# Patient Record
Sex: Male | Born: 1980 | Race: Black or African American | Hispanic: No | Marital: Single | Smoking: Current some day smoker
Health system: Southern US, Community
[De-identification: ages and names within clinical notes are randomized; demographics above are authoritative.]

## PROBLEM LIST (undated history)

## (undated) DIAGNOSIS — Z720 Tobacco use: Secondary | ICD-10-CM

## (undated) DIAGNOSIS — K219 Gastro-esophageal reflux disease without esophagitis: Secondary | ICD-10-CM

## (undated) DIAGNOSIS — E119 Type 2 diabetes mellitus without complications: Secondary | ICD-10-CM

---

## 2016-03-11 ENCOUNTER — Inpatient Hospital Stay (HOSPITAL_COMMUNITY)
Admission: EM | Admit: 2016-03-11 | Discharge: 2016-03-14 | DRG: 638 | Disposition: A | Discharge status: Home / Self Care | Payer: Self-pay | Attending: Internal Medicine | Admitting: Internal Medicine

## 2016-03-11 ENCOUNTER — Emergency Department (HOSPITAL_COMMUNITY): Payer: Self-pay

## 2016-03-11 ENCOUNTER — Encounter (HOSPITAL_COMMUNITY): Payer: Self-pay | Admitting: Emergency Medicine

## 2016-03-11 DIAGNOSIS — R112 Nausea with vomiting, unspecified: Secondary | ICD-10-CM | POA: Diagnosis present

## 2016-03-11 DIAGNOSIS — E1110 Type 2 diabetes mellitus with ketoacidosis without coma: Principal | ICD-10-CM | POA: Diagnosis present

## 2016-03-11 DIAGNOSIS — E785 Hyperlipidemia, unspecified: Secondary | ICD-10-CM | POA: Diagnosis present

## 2016-03-11 DIAGNOSIS — R109 Unspecified abdominal pain: Secondary | ICD-10-CM | POA: Diagnosis present

## 2016-03-11 DIAGNOSIS — Z6834 Body mass index (BMI) 34.0-34.9, adult: Secondary | ICD-10-CM

## 2016-03-11 DIAGNOSIS — Z9111 Patient's noncompliance with dietary regimen: Secondary | ICD-10-CM

## 2016-03-11 DIAGNOSIS — E860 Dehydration: Secondary | ICD-10-CM | POA: Diagnosis present

## 2016-03-11 DIAGNOSIS — E876 Hypokalemia: Secondary | ICD-10-CM | POA: Diagnosis present

## 2016-03-11 DIAGNOSIS — N179 Acute kidney failure, unspecified: Secondary | ICD-10-CM | POA: Diagnosis present

## 2016-03-11 DIAGNOSIS — Z23 Encounter for immunization: Secondary | ICD-10-CM

## 2016-03-11 DIAGNOSIS — R000 Tachycardia, unspecified: Secondary | ICD-10-CM | POA: Diagnosis present

## 2016-03-11 DIAGNOSIS — Z720 Tobacco use: Secondary | ICD-10-CM | POA: Diagnosis present

## 2016-03-11 DIAGNOSIS — D72829 Elevated white blood cell count, unspecified: Secondary | ICD-10-CM | POA: Diagnosis present

## 2016-03-11 DIAGNOSIS — E669 Obesity, unspecified: Secondary | ICD-10-CM | POA: Diagnosis present

## 2016-03-11 DIAGNOSIS — Z833 Family history of diabetes mellitus: Secondary | ICD-10-CM

## 2016-03-11 DIAGNOSIS — E781 Pure hyperglyceridemia: Secondary | ICD-10-CM | POA: Diagnosis present

## 2016-03-11 DIAGNOSIS — F17200 Nicotine dependence, unspecified, uncomplicated: Secondary | ICD-10-CM | POA: Diagnosis present

## 2016-03-11 DIAGNOSIS — Z79899 Other long term (current) drug therapy: Secondary | ICD-10-CM

## 2016-03-11 DIAGNOSIS — Z91030 Bee allergy status: Secondary | ICD-10-CM

## 2016-03-11 DIAGNOSIS — E1169 Type 2 diabetes mellitus with other specified complication: Secondary | ICD-10-CM | POA: Diagnosis present

## 2016-03-11 DIAGNOSIS — K219 Gastro-esophageal reflux disease without esophagitis: Secondary | ICD-10-CM | POA: Diagnosis present

## 2016-03-11 DIAGNOSIS — E119 Type 2 diabetes mellitus without complications: Secondary | ICD-10-CM

## 2016-03-11 HISTORY — DX: Gastro-esophageal reflux disease without esophagitis: K21.9

## 2016-03-11 HISTORY — DX: Tobacco use: Z72.0

## 2016-03-11 LAB — RAPID URINE DRUG SCREEN, HOSP PERFORMED
AMPHETAMINES: NOT DETECTED
BENZODIAZEPINES: NOT DETECTED
Barbiturates: NOT DETECTED
COCAINE: NOT DETECTED
Opiates: NOT DETECTED
Tetrahydrocannabinol: NOT DETECTED

## 2016-03-11 LAB — COMPREHENSIVE METABOLIC PANEL
ALT: 32 U/L (ref 17–63)
ANION GAP: 27 — AB (ref 5–15)
AST: 27 U/L (ref 15–41)
Albumin: 4.7 g/dL (ref 3.5–5.0)
Alkaline Phosphatase: 82 U/L (ref 38–126)
BUN: 34 mg/dL — ABNORMAL HIGH (ref 6–20)
CALCIUM: 8.8 mg/dL — AB (ref 8.9–10.3)
CHLORIDE: 85 mmol/L — AB (ref 101–111)
CO2: 11 mmol/L — ABNORMAL LOW (ref 22–32)
CREATININE: 1.82 mg/dL — AB (ref 0.61–1.24)
GFR, EST AFRICAN AMERICAN: 54 mL/min — AB (ref >60)
GFR, EST NON AFRICAN AMERICAN: 47 mL/min — AB (ref >60)
Glucose, Bld: 701 mg/dL (ref 65–99)
Potassium: 5 mmol/L (ref 3.5–5.1)
Sodium: 123 mmol/L — ABNORMAL LOW (ref 135–145)
Total Bilirubin: 3.6 mg/dL — ABNORMAL HIGH (ref 0.3–1.2)
Total Protein: 9.3 g/dL — ABNORMAL HIGH (ref 6.5–8.1)

## 2016-03-11 LAB — CBC
HCT: 40 % (ref 39.0–52.0)
Hemoglobin: 14.6 g/dL (ref 13.0–17.0)
MCH: 28.4 pg (ref 26.0–34.0)
MCHC: 36.5 g/dL — ABNORMAL HIGH (ref 30.0–36.0)
MCV: 77.8 fL — AB (ref 78.0–100.0)
PLATELETS: 305 10*3/uL (ref 150–400)
RBC: 5.14 MIL/uL (ref 4.22–5.81)
RDW: 14.4 % (ref 11.5–15.5)
WBC: 12.1 10*3/uL — AB (ref 4.0–10.5)

## 2016-03-11 LAB — BASIC METABOLIC PANEL
Anion gap: 17 — ABNORMAL HIGH (ref 5–15)
BUN: 29 mg/dL — AB (ref 6–20)
CHLORIDE: 97 mmol/L — AB (ref 101–111)
CO2: 16 mmol/L — ABNORMAL LOW (ref 22–32)
CREATININE: 1.6 mg/dL — AB (ref 0.61–1.24)
Calcium: 8 mg/dL — ABNORMAL LOW (ref 8.9–10.3)
GFR calc Af Amer: >60 mL/min (ref >60)
GFR calc non Af Amer: 54 mL/min — ABNORMAL LOW (ref >60)
GLUCOSE: 503 mg/dL — AB (ref 65–99)
POTASSIUM: 5.2 mmol/L — AB (ref 3.5–5.1)
SODIUM: 130 mmol/L — AB (ref 135–145)

## 2016-03-11 LAB — BLOOD GAS, VENOUS
Acid-base deficit: 11.1 mmol/L — ABNORMAL HIGH (ref 0.0–2.0)
BICARBONATE: 13.8 mmol/L — AB (ref 20.0–28.0)
FIO2: 0.21
O2 Saturation: 72.3 %
PH VEN: 7.298 (ref 7.250–7.430)
PO2 VEN: 44.9 mmHg (ref 32.0–45.0)
Patient temperature: 37
pCO2, Ven: 29 mmHg — ABNORMAL LOW (ref 44.0–60.0)

## 2016-03-11 LAB — URINALYSIS, ROUTINE W REFLEX MICROSCOPIC
Bilirubin Urine: NEGATIVE
KETONES UR: 80 mg/dL — AB
LEUKOCYTES UA: NEGATIVE
Nitrite: NEGATIVE
PROTEIN: NEGATIVE mg/dL
Specific Gravity, Urine: 1.02 (ref 1.005–1.030)
Squamous Epithelial / LPF: NONE SEEN
pH: 5 (ref 5.0–8.0)

## 2016-03-11 LAB — GLUCOSE, CAPILLARY
GLUCOSE-CAPILLARY: 537 mg/dL — AB (ref 65–99)
Glucose-Capillary: 470 mg/dL — ABNORMAL HIGH (ref 65–99)

## 2016-03-11 LAB — CBG MONITORING, ED
GLUCOSE-CAPILLARY: 502 mg/dL — AB (ref 65–99)
Glucose-Capillary: >600 mg/dL (ref 65–99)

## 2016-03-11 LAB — BILIRUBIN, DIRECT: Bilirubin, Direct: 0.5 mg/dL (ref 0.1–0.5)

## 2016-03-11 LAB — LIPASE, BLOOD: LIPASE: 37 U/L (ref 11–51)

## 2016-03-11 MED ORDER — ONDANSETRON HCL 4 MG/2ML IJ SOLN
4.0000 mg | Freq: Three times a day (TID) | INTRAMUSCULAR | Status: AC | PRN
  Administered 2016-03-12: 4 mg via INTRAVENOUS
  Filled 2016-03-11: qty 2

## 2016-03-11 MED ORDER — SODIUM CHLORIDE 0.9 % IV BOLUS (SEPSIS)
1000.0000 mL | Freq: Once | INTRAVENOUS | Status: AC
  Administered 2016-03-11: 1000 mL via INTRAVENOUS

## 2016-03-11 MED ORDER — ENOXAPARIN SODIUM 40 MG/0.4ML ~~LOC~~ SOLN
40.0000 mg | Freq: Every day | SUBCUTANEOUS | Status: DC
  Administered 2016-03-11 – 2016-03-13 (×3): 40 mg via SUBCUTANEOUS
  Filled 2016-03-11 (×3): qty 0.4

## 2016-03-11 MED ORDER — ALBUTEROL SULFATE (2.5 MG/3ML) 0.083% IN NEBU: 2.5000 mg | INHALATION_SOLUTION | RESPIRATORY_TRACT | Status: DC | PRN

## 2016-03-11 MED ORDER — MORPHINE SULFATE (PF) 2 MG/ML IV SOLN
2.0000 mg | INTRAVENOUS | Status: DC | PRN
Start: 2016-03-11 — End: 2016-03-12

## 2016-03-11 MED ORDER — SODIUM CHLORIDE 0.9 % IV SOLN
INTRAVENOUS | Status: DC
  Administered 2016-03-11: 4.8 [IU]/h via INTRAVENOUS
  Filled 2016-03-11: qty 2.5

## 2016-03-11 MED ORDER — SODIUM CHLORIDE 0.9 % IV BOLUS (SEPSIS): 1000.0000 mL | Freq: Once | INTRAVENOUS | Status: DC

## 2016-03-11 MED ORDER — DM-GUAIFENESIN ER 30-600 MG PO TB12: 1.0000 | ORAL_TABLET | Freq: Two times a day (BID) | ORAL | Status: DC | PRN

## 2016-03-11 MED ORDER — SODIUM CHLORIDE 0.9 % IV SOLN
INTRAVENOUS | Status: DC
  Administered 2016-03-11: 22:00:00 via INTRAVENOUS

## 2016-03-11 MED ORDER — NICOTINE 21 MG/24HR TD PT24
21.0000 mg | MEDICATED_PATCH | Freq: Every day | TRANSDERMAL | Status: DC
  Filled 2016-03-11: qty 1

## 2016-03-11 MED ORDER — DEXTROSE-NACL 5-0.45 % IV SOLN: INTRAVENOUS | Status: DC

## 2016-03-11 MED ORDER — ACETAMINOPHEN 325 MG PO TABS: 650.0000 mg | ORAL_TABLET | Freq: Four times a day (QID) | ORAL | Status: DC | PRN

## 2016-03-11 MED ORDER — SODIUM CHLORIDE 0.9 % IV SOLN
INTRAVENOUS | Status: DC
  Filled 2016-03-11: qty 2.5

## 2016-03-11 MED ORDER — PNEUMOCOCCAL VAC POLYVALENT 25 MCG/0.5ML IJ INJ
0.5000 mL | INJECTION | INTRAMUSCULAR | Status: AC
  Administered 2016-03-13: 0.5 mL via INTRAMUSCULAR
  Filled 2016-03-11 (×2): qty 0.5

## 2016-03-11 MED ORDER — INFLUENZA VAC SPLIT QUAD 0.5 ML IM SUSY
0.5000 mL | PREFILLED_SYRINGE | INTRAMUSCULAR | Status: AC
  Administered 2016-03-13: 0.5 mL via INTRAMUSCULAR
  Filled 2016-03-11: qty 0.5

## 2016-03-11 MED ORDER — POTASSIUM CHLORIDE CRYS ER 20 MEQ PO TBCR
20.0000 meq | EXTENDED_RELEASE_TABLET | Freq: Once | ORAL | Status: AC
  Administered 2016-03-11: 20 meq via ORAL
  Filled 2016-03-11: qty 1

## 2016-03-11 MED ORDER — POTASSIUM CHLORIDE IN NACL 20-0.9 MEQ/L-% IV SOLN
Freq: Once | INTRAVENOUS | Status: DC
  Filled 2016-03-11: qty 1000

## 2016-03-11 MED ORDER — SODIUM CHLORIDE 0.9 % IV SOLN: INTRAVENOUS | Status: DC

## 2016-03-11 MED ORDER — ORAL CARE MOUTH RINSE
15.0000 mL | Freq: Two times a day (BID) | OROMUCOSAL | Status: DC
  Administered 2016-03-12 – 2016-03-13 (×3): 15 mL via OROMUCOSAL

## 2016-03-11 MED ORDER — ZOLPIDEM TARTRATE 5 MG PO TABS: 5.0000 mg | ORAL_TABLET | Freq: Every evening | ORAL | Status: DC | PRN

## 2016-03-11 MED ORDER — DEXTROSE-NACL 5-0.45 % IV SOLN
INTRAVENOUS | Status: DC
  Administered 2016-03-12: 03:00:00 via INTRAVENOUS

## 2016-03-11 MED ORDER — PANTOPRAZOLE SODIUM 40 MG PO TBEC
40.0000 mg | DELAYED_RELEASE_TABLET | Freq: Every day | ORAL | Status: DC
  Administered 2016-03-12 – 2016-03-14 (×3): 40 mg via ORAL
  Filled 2016-03-11 (×3): qty 1

## 2016-03-11 NOTE — ED Provider Notes (Signed)
[facility]-EMERGENCY DEPT Provider Note   CSN: [id number] Arrival date & time: 03/11/16  1820     History   Chief Complaint Chief Complaint  Patient presents with  . Nausea  . Abdominal Pain    HPI William Martin is a 35 y.o. male.  Patient is a 35 year old male with no prior medical history presenting today for worsening nausea vomiting abdominal pain. Patient states approximately 4 days ago he started to have epigastric pain which is worse with eating. He then developed nausea and now vomits anytime he drinks or eats anything. The pain in his stomach is worse with eating. It is in the epigastric region and is sharp in nature. It does not radiate anywhere and does not feel like his typical reflux. He has complained of polydipsia and polyuria. No diarrhea or fever and last normal bowel movement was yesterday. Denies alcohol, drug or tobacco use within the last month. He takes no prescription medications.   The history is provided by the patient.    History reviewed. No pertinent past medical history.  Patient Active Problem List   Diagnosis Date Noted  . DKA (diabetic ketoacidoses) (HCC) 03/11/2016    History reviewed. No pertinent surgical history.     Home Medications    Prior to Admission medications   Medication Sig Start Date End Date Taking? Authorizing Provider  omeprazole (PRILOSEC OTC) 20 MG tablet Take 20 mg by mouth daily as needed (reflux/heartburn.).   Yes Historical Provider, MD    Family History History reviewed. No pertinent family history.  Social History Social History  Substance Use Topics  . Smoking status: Current Some Day Smoker  . Smokeless tobacco: Never Used  . Alcohol use Yes     Comment: occasional     Allergies   Bee venom   Review of Systems Review of Systems  All other systems reviewed and are negative.    Physical Exam Updated Vital Signs BP (!) 137/106 (BP Location: Left Arm)   Pulse 114   Temp 97.8 F (36.6 C) (Oral)    Resp 18   Ht 5\' 10"  (1.778 m)   Wt 240 lb (108.9 kg)   SpO2 99%   BMI 34.44 kg/m   Physical Exam  Constitutional: He is oriented to person, place, and time. He appears well-developed and well-nourished. No distress.  Smells of ketones  HENT:  Head: Normocephalic and atraumatic.  Mouth/Throat: Oropharynx is clear and moist. Mucous membranes are dry.  Eyes: Conjunctivae and EOM are normal. Pupils are equal, round, and reactive to light.  Neck: Normal range of motion. Neck supple.  Cardiovascular: Regular rhythm and intact distal pulses.  Tachycardia present.   No murmur heard. Pulmonary/Chest: Effort normal and breath sounds normal. No respiratory distress. He has no wheezes. He has no rales.  Abdominal: Soft. He exhibits no distension. There is tenderness. There is no rebound and no guarding.  Mild epigastric tenderness  Musculoskeletal: Normal range of motion. He exhibits no edema or tenderness.  Neurological: He is alert and oriented to person, place, and time.  Skin: Skin is warm and dry. No rash noted. No erythema.  Psychiatric: He has a normal mood and affect. His behavior is normal.  Nursing note and vitals reviewed.    ED Treatments / Results  Labs (all labs ordered are listed, but only abnormal results are displayed) Labs Reviewed  COMPREHENSIVE METABOLIC PANEL - Abnormal; Notable for the following:       Result Value   Sodium 123 (*)  Chloride 85 (*)    CO2 11 (*)    Glucose, Bld 701 (*)    BUN 34 (*)    Creatinine, Ser 1.82 (*)    Calcium 8.8 (*)    Total Protein 9.3 (*)    Total Bilirubin 3.6 (*)    GFR calc non Af Amer 47 (*)    GFR calc Af Amer 54 (*)    Anion gap 27 (*)    All other components within normal limits  CBC - Abnormal; Notable for the following:    WBC 12.1 (*)    MCV 77.8 (*)    MCHC 36.5 (*)    All other components within normal limits  URINALYSIS, ROUTINE W REFLEX MICROSCOPIC - Abnormal; Notable for the following:    Color, Urine  STRAW (*)    Glucose, UA >=500 (*)    Hgb urine dipstick SMALL (*)    Ketones, ur 80 (*)    Bacteria, UA RARE (*)    All other components within normal limits  BLOOD GAS, VENOUS - Abnormal; Notable for the following:    pCO2, Ven 29.0 (*)    Bicarbonate 13.8 (*)    Acid-base deficit 11.1 (*)    All other components within normal limits  CBG MONITORING, ED - Abnormal; Notable for the following:    Glucose-Capillary >600 (*)    All other components within normal limits  LIPASE, BLOOD    EKG ED ECG REPORT   Date: 03/11/2016  Rate: 115  Rhythm: sinus tachycardia  QRS Axis: normal  Intervals: QT prolonged  ST/T Wave abnormalities: normal  Conduction Disutrbances:none  Narrative Interpretation:   Old EKG Reviewed: none available  I have personally reviewed the EKG tracing and agree with the computerized printout as noted.   Radiology Dg Chest 2 View  Result Date: 03/11/2016 CLINICAL DATA:  Shortness of breath for the past 2 days. EXAM: CHEST  2 VIEW COMPARISON:  None. FINDINGS: The heart size and mediastinal contours are within normal limits. Both lungs are clear. The visualized skeletal structures are unremarkable. IMPRESSION: Normal examination. Electronically Signed   By: Beckie SaltsSteven  Reid M.D.   On: 03/11/2016 19:25    Procedures Procedures (including critical care time)  Medications Ordered in ED Medications  sodium chloride 0.9 % bolus 1,000 mL (1,000 mLs Intravenous Not Given 03/11/16 2047)  dextrose 5 %-0.45 % sodium chloride infusion ( Intravenous Hold 03/11/16 2049)  insulin regular (NOVOLIN R,HUMULIN R) 250 Units in sodium chloride 0.9 % 250 mL (1 Units/mL) infusion (not administered)  sodium chloride 0.9 % bolus 1,000 mL (1,000 mLs Intravenous New Bag/Given 03/11/16 2035)    And  sodium chloride 0.9 % bolus 1,000 mL (1,000 mLs Intravenous Not Given 03/11/16 2049)    And  0.9 %  sodium chloride infusion (not administered)  Influenza vac split quadrivalent PF  (FLUARIX) injection 0.5 mL (not administered)  pneumococcal 23 valent vaccine (PNU-IMMUNE) injection 0.5 mL (not administered)  ondansetron (ZOFRAN) injection 4 mg (not administered)  sodium chloride 0.9 % bolus 1,000 mL (1,000 mLs Intravenous New Bag/Given 03/11/16 1954)     Initial Impression / Assessment and Plan / ED Course  I have reviewed the triage vital signs and the nursing notes.  Pertinent labs & imaging results that were available during my care of the patient were reviewed by me and considered in my medical decision making (see chart for details).  Clinical Course     Patient is a 35 year old male with no prior medical  history presenting today with 4 days of worsening epigastric discomfort, nausea and vomiting. Patient denies any alcohol, drug or tobacco use. He takes no medications regularly. Patient smells of ketones on exam and suspicious for DKA. Labs are also consistent with DKA with a bicarbonate of 13 and normal pH and a CO2 of 29. CMP with a blood sugar of 700 and potassium of 5. Evidence of a KI with a creatinine of 1.82 and an anion gap of 27. Mild leukocytosis but no infectious symptoms at this time. Patient received 2 L of fluid and was started on glucose stabilizer with insulin and potassium replacement. Will admit to stepdown.  CRITICAL CARE Performed by: Gwyneth Sprout Total critical care time: 30 minutes Critical care time was exclusive of separately billable procedures and treating other patients. Critical care was necessary to treat or prevent imminent or life-threatening deterioration. Critical care was time spent personally by me on the following activities: development of treatment plan with patient and/or surrogate as well as nursing, discussions with consultants, evaluation of patient's response to treatment, examination of patient, obtaining history from patient or surrogate, ordering and performing treatments and interventions, ordering and review of  laboratory studies, ordering and review of radiographic studies, pulse oximetry and re-evaluation of patient's condition.   Final Clinical Impressions(s) / ED Diagnoses   Final diagnoses:  Diabetic ketoacidosis without coma associated with type 2 diabetes mellitus (HCC)  AKI (acute kidney injury) (HCC)    New Prescriptions New Prescriptions   No medications on file     Gwyneth Sprout, MD 03/11/16 2108

## 2016-03-11 NOTE — ED Triage Notes (Signed)
Pt reports he has had nausea and central abd pain for the past 4 days. No diarrhea or emesis. Has been drinking fluids, but not solid food. Pt also reports feeling of SOB for the past 3 days. No hx of asthma.

## 2016-03-11 NOTE — H&P (Signed)
History and Physical    William Martin ZOX:[id number] DOB: October 22, 1980 DOA: 03/11/2016  Referring MD/NP/PA:   PCP: No primary care provider on file.   Patient coming from:  The patient is coming from home.  At baseline, pt is independent for most of ADL.   Chief Complaint: nausea, vomiting, abdominal pain  HPI: William Martin is a 35 y.o. male with medical history significant of tobacco abuse, GERD, who presents with nausea, vomiting and abdominal pain.  Patient states that he has been having nausea, vomiting and abdominal pain in the past 4 days. His abdominal pain has worsened today. His abdominal pain is located in the upper abdomen, constant, sharp, 7 out of 10 in severity, nonradiating. It is not aggravated or alleviated by any known factors. He vomited once without blood in the vomitus today. He has complained of polydipsia and polyuria. No diarrhea. He has chills, but no fever. He has heartburn and indigestion. He denies chest pain. He has mild dry cough. No symptoms of UTI or unilateral weakness.  ED Course: pt was found to have DAK with AG 27 and CBG 701, bicarbonate 11, lipase is 37, WBC 12.1, negative urinalysis, AKi with creatinine 1.81, blood sugar 701, pseudohyponatremia, negative chest x-ray, temperature normal, tachycardia, oxygen saturation 99% on room air. Patient is admitted to Center as inpatient  Review of Systems:   General: no fevers, has chills, no changes in body weight, has poor appetite, has fatigue HEENT: no blurry vision, hearing changes or sore throat Respiratory: no dyspnea, has coughing, no wheezing CV: no chest pain, no palpitations GI: has nausea, vomiting, abdominal pain, no diarrhea, constipation GU: no dysuria, burning on urination, has increased urinary frequency, no hematuria  Ext: no leg edema Neuro: no unilateral weakness, numbness, or tingling, no vision change or hearing loss Skin: no rash, no skin tear. MSK: No muscle spasm, no deformity, no  limitation of range of movement in spin Heme: No easy bruising.  Travel history: No recent long distant travel.  Allergy:  Allergies  Allergen Reactions  . Bee Venom Swelling    Past Medical History:  Diagnosis Date  . GERD (gastroesophageal reflux disease)   . Tobacco abuse     History reviewed. No pertinent surgical history.  Social History:  reports that he has been smoking.  He has never used smokeless tobacco. He reports that he drinks alcohol. He reports that he does not use drugs.  Family History:  Family History  Problem Relation Age of Onset  . Diabetes type II Mother      Prior to Admission medications   Medication Sig Start Date End Date Taking? Authorizing Provider  omeprazole (PRILOSEC OTC) 20 MG tablet Take 20 mg by mouth daily as needed (reflux/heartburn.).   Yes Historical Provider, MD    Physical Exam: Vitals:   03/11/16 1830 03/11/16 2137  BP: (!) 137/106 141/70  Pulse: 114 99  Resp: 18 18  Temp: 97.8 F (36.6 C)   TempSrc: Oral   SpO2: 99% 100%  Weight: 108.9 kg (240 lb)   Height: 5\' 10"  (1.778 m)    General: Not in acute distress. Dry mucus and membrane HEENT:       Eyes: PERRL, EOMI, no scleral icterus.       ENT: No discharge from the ears and nose, no pharynx injection, no tonsillar enlargement.        Neck: No JVD, no bruit, no mass felt. Heme: No neck lymph node enlargement. Cardiac: S1/S2, RRR, No murmurs,  No gallops or rubs. Respiratory: No rales, wheezing, rhonchi or rubs. GI: Soft, nondistended, mild tenderness in epastric area, no rebound pain, no organomegaly, BS present. GU: No hematuria Ext: No pitting leg edema bilaterally. 2+DP/PT pulse bilaterally. Musculoskeletal: No joint deformities, No joint redness or warmth, no limitation of ROM in spin. Skin: No rashes.  Neuro: Alert, oriented X3, cranial nerves II-XII grossly intact, moves all extremities normally. Psych: Patient is not psychotic, no suicidal or hemocidal  ideation.  Labs on Admission: I have personally reviewed following labs and imaging studies  CBC:  Recent Labs Lab 03/11/16 1917  WBC 12.1*  HGB 14.6  HCT 40.0  MCV 77.8*  PLT 305   Basic Metabolic Panel:  Recent Labs Lab 03/11/16 1917  NA 123*  K 5.0  CL 85*  CO2 11*  GLUCOSE 701*  BUN 34*  CREATININE 1.82*  CALCIUM 8.8*   GFR: Estimated Creatinine Clearance: 70 mL/min (by C-G formula based on SCr of 1.82 mg/dL (H)). Liver Function Tests:  Recent Labs Lab 03/11/16 1917  AST 27  ALT 32  ALKPHOS 82  BILITOT 3.6*  PROT 9.3*  ALBUMIN 4.7    Recent Labs Lab 03/11/16 1917  LIPASE 37   No results for input(s): AMMONIA in the last 168 hours. Coagulation Profile: No results for input(s): INR, PROTIME in the last 168 hours. Cardiac Enzymes: No results for input(s): CKTOTAL, CKMB, CKMBINDEX, TROPONINI in the last 168 hours. BNP (last 3 results) No results for input(s): PROBNP in the last 8760 hours. HbA1C: No results for input(s): HGBA1C in the last 72 hours. CBG:  Recent Labs Lab 03/11/16 1949 03/11/16 2130  GLUCAP >600* 502*   Lipid Profile: No results for input(s): CHOL, HDL, LDLCALC, TRIG, CHOLHDL, LDLDIRECT in the last 72 hours. Thyroid Function Tests: No results for input(s): TSH, T4TOTAL, FREET4, T3FREE, THYROIDAB in the last 72 hours. Anemia Panel: No results for input(s): VITAMINB12, FOLATE, FERRITIN, TIBC, IRON, RETICCTPCT in the last 72 hours. Urine analysis:    Component Value Date/Time   COLORURINE STRAW (A) 03/11/2016 1920   APPEARANCEUR CLEAR 03/11/2016 1920   LABSPEC 1.020 03/11/2016 1920   PHURINE 5.0 03/11/2016 1920   GLUCOSEU >=500 (A) 03/11/2016 1920   HGBUR SMALL (A) 03/11/2016 1920   BILIRUBINUR NEGATIVE 03/11/2016 1920   KETONESUR 80 (A) 03/11/2016 1920   PROTEINUR NEGATIVE 03/11/2016 1920   NITRITE NEGATIVE 03/11/2016 1920   LEUKOCYTESUR NEGATIVE 03/11/2016 1920   Sepsis  Labs: @LABRCNTIP (procalcitonin:4,lacticidven:4) )No results found for this or any previous visit (from the past 240 hour(s)).   Radiological Exams on Admission: Dg Chest 2 View  Result Date: 03/11/2016 CLINICAL DATA:  Shortness of breath for the past 2 days. EXAM: CHEST  2 VIEW COMPARISON:  None. FINDINGS: The heart size and mediastinal contours are within normal limits. Both lungs are clear. The visualized skeletal structures are unremarkable. IMPRESSION: Normal examination. Electronically Signed   By: Beckie Salts M.D.   On: 03/11/2016 19:25     EKG: Independently reviewed.  Sinus rhythm, QTC 472, tachycardia, nonspecific T-wave change   Assessment/Plan Principal Problem:   DKA (diabetic ketoacidoses) (HCC) Active Problems:   New onset type 2 diabetes mellitus (HCC)   Tobacco abuse   GERD (gastroesophageal reflux disease)   AKI (acute kidney injury) (HCC)   New onset type 2 diabetes mellitus and DKA (diabetic ketoacidoses): Patient has DKA with AG of 27 and bicarbonate of 11, ketone positivity in urine. Mental status is normal.   - Admit to stepdown  as inpt - Received 4L of NS bolus in ED - start DKA protocol with BMP q4h - IVF: NS 125 cc/h; will switch to D5-1/2NS when CBG<250 - replete K as needed - Zofran prn nausea  - NPO  - consult to diabetic educator and case manager  Tobacco abuse: -Did counseling about importance of quitting smoking -Nicotine patch  GERD: -Protonix  AKI: Likely due to prerenal secondary to dehydration. Urine protein negative - IVF as above - Check FeNa - Follow up renal function by BMP  Leukocytosis: no signs of infection. Chest x-ray negative. Urinalysis negative. Likely due to DKA. -follow up by CBC  Abdominal pain, nausea vomiting: Lipase normal. Most likely due to DKA. Currently his abdominal pain is mild. His total bilirubin is 3.6 with normal transaminase, will need to r/o biliary system problem -US-abdomen -direct BR -When  necessary Zofran for nausea and morphine for pain   DVT ppx: SQ Lovenox Code Status: Full code Family Communication: Yes, patient's nephew at bed side Disposition Plan:  Anticipate discharge back to previous home environment Consults called:  none Admission status: Inpatient/tele   Date of Service 03/11/2016    Lorretta HarpIU, Hermena Swint [facility] Pager [phone]  If 7PM-7AM, please contact night-coverage [email or web address] Password [email or web address] 03/11/2016, 9:52 PM

## 2016-03-12 ENCOUNTER — Inpatient Hospital Stay (HOSPITAL_COMMUNITY): Payer: Self-pay

## 2016-03-12 DIAGNOSIS — E119 Type 2 diabetes mellitus without complications: Secondary | ICD-10-CM

## 2016-03-12 DIAGNOSIS — E1310 Other specified diabetes mellitus with ketoacidosis without coma: Secondary | ICD-10-CM

## 2016-03-12 DIAGNOSIS — K219 Gastro-esophageal reflux disease without esophagitis: Secondary | ICD-10-CM

## 2016-03-12 DIAGNOSIS — E876 Hypokalemia: Secondary | ICD-10-CM

## 2016-03-12 LAB — BASIC METABOLIC PANEL
ANION GAP: 19 — AB (ref 5–15)
ANION GAP: 15 (ref 5–15)
ANION GAP: 12 (ref 5–15)
BUN: 24 mg/dL — ABNORMAL HIGH (ref 6–20)
BUN: 21 mg/dL — ABNORMAL HIGH (ref 6–20)
BUN: 17 mg/dL (ref 6–20)
CO2: 16 mmol/L — ABNORMAL LOW (ref 22–32)
CO2: 20 mmol/L — ABNORMAL LOW (ref 22–32)
CO2: 20 mmol/L — ABNORMAL LOW (ref 22–32)
Calcium: 8.2 mg/dL — ABNORMAL LOW (ref 8.9–10.3)
Calcium: 8.4 mg/dL — ABNORMAL LOW (ref 8.9–10.3)
Calcium: 7.8 mg/dL — ABNORMAL LOW (ref 8.9–10.3)
Chloride: 101 mmol/L (ref 101–111)
Chloride: 103 mmol/L (ref 101–111)
Chloride: 101 mmol/L (ref 101–111)
Creatinine, Ser: 1.39 mg/dL — ABNORMAL HIGH (ref 0.61–1.24)
Creatinine, Ser: 1.16 mg/dL (ref 0.61–1.24)
Creatinine, Ser: 1.06 mg/dL (ref 0.61–1.24)
GFR calc Af Amer: >60 mL/min (ref >60)
GFR calc Af Amer: >60 mL/min (ref >60)
Glucose, Bld: 268 mg/dL — ABNORMAL HIGH (ref 65–99)
Glucose, Bld: 138 mg/dL — ABNORMAL HIGH (ref 65–99)
Glucose, Bld: 268 mg/dL — ABNORMAL HIGH (ref 65–99)
POTASSIUM: 4.2 mmol/L (ref 3.5–5.1)
POTASSIUM: 3.8 mmol/L (ref 3.5–5.1)
POTASSIUM: 3.4 mmol/L — AB (ref 3.5–5.1)
SODIUM: 136 mmol/L (ref 135–145)
SODIUM: 138 mmol/L (ref 135–145)
SODIUM: 133 mmol/L — AB (ref 135–145)

## 2016-03-12 LAB — LIPID PANEL
Cholesterol: 311 mg/dL — ABNORMAL HIGH (ref 0–200)
HDL: 32 mg/dL — ABNORMAL LOW (ref >40)
LDL CALC: UNDETERMINED mg/dL (ref 0–99)
TRIGLYCERIDES: 497 mg/dL — AB (ref <150)
Total CHOL/HDL Ratio: 9.7 RATIO
VLDL: UNDETERMINED mg/dL (ref 0–40)

## 2016-03-12 LAB — GLUCOSE, CAPILLARY
GLUCOSE-CAPILLARY: 151 mg/dL — AB (ref 65–99)
GLUCOSE-CAPILLARY: 218 mg/dL — AB (ref 65–99)
GLUCOSE-CAPILLARY: 243 mg/dL — AB (ref 65–99)
GLUCOSE-CAPILLARY: 263 mg/dL — AB (ref 65–99)
GLUCOSE-CAPILLARY: 266 mg/dL — AB (ref 65–99)
GLUCOSE-CAPILLARY: 302 mg/dL — AB (ref 65–99)
GLUCOSE-CAPILLARY: 315 mg/dL — AB (ref 65–99)
GLUCOSE-CAPILLARY: 374 mg/dL — AB (ref 65–99)
Glucose-Capillary: 203 mg/dL — ABNORMAL HIGH (ref 65–99)
Glucose-Capillary: 159 mg/dL — ABNORMAL HIGH (ref 65–99)
Glucose-Capillary: 142 mg/dL — ABNORMAL HIGH (ref 65–99)
Glucose-Capillary: 159 mg/dL — ABNORMAL HIGH (ref 65–99)
Glucose-Capillary: 218 mg/dL — ABNORMAL HIGH (ref 65–99)
Glucose-Capillary: 138 mg/dL — ABNORMAL HIGH (ref 65–99)
Glucose-Capillary: 331 mg/dL — ABNORMAL HIGH (ref 65–99)

## 2016-03-12 LAB — MRSA PCR SCREENING: MRSA by PCR: NEGATIVE

## 2016-03-12 LAB — CREATININE, URINE, RANDOM: CREATININE, URINE: 21.75 mg/dL

## 2016-03-12 LAB — SODIUM, URINE, RANDOM: Sodium, Ur: 42 mmol/L

## 2016-03-12 MED ORDER — INSULIN ASPART 100 UNIT/ML ~~LOC~~ SOLN
4.0000 [IU] | Freq: Three times a day (TID) | SUBCUTANEOUS | Status: DC
  Administered 2016-03-12: 4 [IU] via SUBCUTANEOUS

## 2016-03-12 MED ORDER — ALUM & MAG HYDROXIDE-SIMETH 200-200-20 MG/5ML PO SUSP
30.0000 mL | Freq: Four times a day (QID) | ORAL | Status: DC | PRN
  Administered 2016-03-12: 30 mL via ORAL
  Filled 2016-03-12: qty 30

## 2016-03-12 MED ORDER — INSULIN DETEMIR 100 UNIT/ML ~~LOC~~ SOLN: 40.0000 [IU] | Freq: Every day | SUBCUTANEOUS | Status: DC

## 2016-03-12 MED ORDER — INSULIN ASPART 100 UNIT/ML ~~LOC~~ SOLN
0.0000 [IU] | Freq: Three times a day (TID) | SUBCUTANEOUS | Status: DC
  Administered 2016-03-12: 15 [IU] via SUBCUTANEOUS
  Administered 2016-03-13: 20 [IU] via SUBCUTANEOUS
  Administered 2016-03-13: 11 [IU] via SUBCUTANEOUS
  Administered 2016-03-14: 20 [IU] via SUBCUTANEOUS
  Administered 2016-03-14: 11 [IU] via SUBCUTANEOUS

## 2016-03-12 MED ORDER — INSULIN DETEMIR 100 UNIT/ML ~~LOC~~ SOLN
35.0000 [IU] | Freq: Every day | SUBCUTANEOUS | Status: DC
Start: 2016-03-12 — End: 2016-03-13
  Administered 2016-03-12: 35 [IU] via SUBCUTANEOUS
  Filled 2016-03-12 (×2): qty 0.35

## 2016-03-12 MED ORDER — INSULIN ASPART 100 UNIT/ML ~~LOC~~ SOLN
0.0000 [IU] | Freq: Every day | SUBCUTANEOUS | Status: DC
  Administered 2016-03-12 – 2016-03-13 (×2): 4 [IU] via SUBCUTANEOUS

## 2016-03-12 NOTE — Progress Notes (Signed)
In room to titrate insulin drip - CBG noted to be 266. Family in room with patient. I asked patient what he had eaten, he states "nothing Im trying to figure out why it went up." Asked patient what he had to drink as a large [employer] cup was sitting on the bedside table. He states "Sprite." Sister states she bought the Sprite to the patient because she was not aware that he could not have it. Patient states "I knew I wasn't supposed to drink it but I wanted it." Family informed that they are not allowed to bring food or drink from outside sources. Patient educated that he can only drink water or diet sodas, and reminded that he is already in DKA and if he continues with poor habits, his outcome will not improve. Patient nodded his head to agree. Sprite poured out into sink by nephew. Will continue to educate patient.

## 2016-03-12 NOTE — Progress Notes (Signed)
Pt. Arrived to floor from ICU. Alert and oriented x 4. No respiratory distress noted. Family at bedside.

## 2016-03-12 NOTE — Progress Notes (Signed)
[facility] transfer to [facility], as a med surg transfer. Pt in stable condition, with no acute distress, VSS, alert, oriented x4, verbal, appropriate. All belongings sent with patient. Patient taken via wheelchair to 1501.

## 2016-03-12 NOTE — Progress Notes (Signed)
PROGRESS NOTE                                                                                                                                                                                                             Patient Demographics:    William Martin, is a 35 y.o. male, DOB - February 06, 1981, NWG:[id number]  Admit date - 03/11/2016   Admitting Physician Lorretta Harp, MD  Outpatient Primary MD for the patient is No primary care provider on file.  LOS - 1  Outpatient Specialists:None  Chief Complaint  Patient presents with  . Nausea  . Abdominal Pain       Brief Narrative   35 year old obese male with history of GERD presented with 3-4 days of dominant discomfort with nausea and vomiting, polyuria and polydipsia. Patient found to be in DKA with CBG of 701, anion gap of 27 and bicarbonate of 11. He also had acute kidney injury. Admitted to stepdown on insulin drip and IV hydration.   Subjective:   No further nausea or vomiting. Denies abdominal pain. Reports being very hungry.   Assessment  & Plan :    Principal Problem:   DKA (diabetic ketoacidoses) (HCC) New onset . Reports strong family history (both mom and grandmother had uncontrolled diabetes). Hydrated aggressively and placed on insulin drip. Anion gap now closed and CABG stable. Transition to Levemir 35 units daily and pre-meal aspart 4 units 3 times a day with meals. Suspect type 2 diabetes. Will need long-acting insulin and oral hypoglycemic (preferably metformin) upon discharge. -Discussed at length with patient and his sister at bedside on last modifications, diet adherence, blood glucose monitoring, exercise to lose weight and close outpatient follow-up. Patient does not have a PCP and will provide list of PCPs in the community to establish care.  Active Problems:    GERD (gastroesophageal reflux disease) Continue PPI    AKI (acute kidney injury)  (HCC) Prerenal secondary to dehydration. Resolved with IV fluids. Next on son hypokalemia Replenished  Obesity Counseled on diet and exercise   Code Status : Full code  Family Communication  : Sister at bedside  Disposition Plan  : Transfer to [facility]. Home tomorrow if CBG stable  Barriers For Discharge : Improving symptoms  Consults  :   Diabetic coordinator  Procedures  : None DVT Prophylaxis  :  Lovenox -   Lab Results  Component Value Date   PLT 305 03/11/2016    Antibiotics  :    Anti-infectives    None        Objective:   Vitals:   03/12/16 0800 03/12/16 0900 03/12/16 1000 03/12/16 1100  BP: 126/83     Pulse: 97 90 93 96  Resp: 13 10 12 19   Temp: 98.1 F (36.7 C)     TempSrc: Oral     SpO2: 100% 95% 98% 95%  Weight:      Height:        Wt Readings from Last 3 Encounters:  03/11/16 108.9 kg (240 lb)     Intake/Output Summary (Last 24 hours) at 03/12/16 1204 Last data filed at 03/12/16 1100  Gross per 24 hour  Intake           1798.8 ml  Output             2350 ml  Net           -551.2 ml     Physical Exam  Gen: not in distress HEENT:  moist mucosa, supple neck Chest: clear b/l, no added sounds CVS: N S1&S2, no murmurs, GI: soft, NT, ND, BS+ Musculoskeletal: warm, no edema     Data Review:    CBC  Recent Labs Lab 03/11/16 1917  WBC 12.1*  HGB 14.6  HCT 40.0  PLT 305  MCV 77.8*  MCH 28.4  MCHC 36.5*  RDW 14.4    Chemistries   Recent Labs Lab 03/11/16 1917 03/11/16 2227 03/12/16 0142 03/12/16 0557 03/12/16 0955  NA 123* 130* 136 138 133*  K 5.0 5.2* 4.2 3.8 3.4*  CL 85* 97* 101 103 101  CO2 11* 16* 16* 20* 20*  GLUCOSE 701* 503* 268* 138* 268*  BUN 34* 29* 24* 21* 17  CREATININE 1.82* 1.60* 1.39* 1.16 1.06  CALCIUM 8.8* 8.0* 8.2* 8.4* 7.8*  AST 27  --   --   --   --   ALT 32  --   --   --   --   ALKPHOS 82  --   --   --   --   BILITOT 3.6*  --   --   --   --     ------------------------------------------------------------------------------------------------------------------  Recent Labs  03/12/16 0557  CHOL 311*  HDL 32*  LDLCALC UNABLE TO CALCULATE IF TRIGLYCERIDE OVER 400 mg/dL  TRIG [id number]*  CHOLHDL 9.7    No results found for: HGBA1C ------------------------------------------------------------------------------------------------------------------ No results for input(s): TSH, T4TOTAL, T3FREE, THYROIDAB in the last 72 hours.  Invalid input(s): FREET3 ------------------------------------------------------------------------------------------------------------------ No results for input(s): VITAMINB12, FOLATE, FERRITIN, TIBC, IRON, RETICCTPCT in the last 72 hours.  Coagulation profile No results for input(s): INR, PROTIME in the last 168 hours.  No results for input(s): DDIMER in the last 72 hours.  Cardiac Enzymes No results for input(s): CKMB, TROPONINI, MYOGLOBIN in the last 168 hours.  Invalid input(s): CK ------------------------------------------------------------------------------------------------------------------ No results found for: BNP  Inpatient Medications  Scheduled Meds: . enoxaparin (LOVENOX) injection  40 mg Subcutaneous QHS  . Influenza vac split quadrivalent PF  0.5 mL Intramuscular Tomorrow-1000  . insulin aspart  0-20 Units Subcutaneous TID WC  . insulin aspart  0-5 Units Subcutaneous QHS  . insulin aspart  4 Units Subcutaneous TID WC  . insulin detemir  35 Units Subcutaneous Daily  . mouth rinse  15 mL Mouth Rinse BID  . pantoprazole  40 mg  Oral Q1200  . pneumococcal 23 valent vaccine  0.5 mL Intramuscular Tomorrow-1000  . sodium chloride  1,000 mL Intravenous Once  . sodium chloride  1,000 mL Intravenous Once   Continuous Infusions: . dextrose 5 % and 0.45% NaCl 100 mL/hr at 03/12/16 0600  . insulin (NOVOLIN-R) infusion 14.2 Units/hr (03/12/16 1130)   PRN Meds:.acetaminophen, albuterol,  dextromethorphan-guaiFENesin, morphine injection, zolpidem  Micro Results Recent Results (from the past 240 hour(s))  MRSA PCR Screening     Status: None   Collection Time: 03/12/16  2:08 AM  Result Value Ref Range Status   MRSA by PCR NEGATIVE NEGATIVE Final    Comment:        The GeneXpert MRSA Assay (FDA approved for NASAL specimens only), is one component of a comprehensive MRSA colonization surveillance program. It is not intended to diagnose MRSA infection nor to guide or monitor treatment for MRSA infections.     Radiology Reports Dg Chest 2 View  Result Date: 03/11/2016 CLINICAL DATA:  Shortness of breath for the past 2 days. EXAM: CHEST  2 VIEW COMPARISON:  None. FINDINGS: The heart size and mediastinal contours are within normal limits. Both lungs are clear. The visualized skeletal structures are unremarkable. IMPRESSION: Normal examination. Electronically Signed   By: Beckie SaltsSteven  Reid M.D.   On: 03/11/2016 19:25   [place] Abdomen Limited Ruq  Result Date: 03/12/2016 CLINICAL DATA:  Abdominal pain, elevated bilirubin EXAM: US ABDOMEN LIMITED - RIGHT UPPER QUADRANT COMPARISON:  None. FINDINGS: Gallbladder: Small amount of sludge within the gallbladder. No visible stones, wall thickening or sonographic Murphy sign. Common bile duct: Diameter: Normal caliber, 5 mm Liver: Increased echotexture throughout the liver compatible with fatty infiltration. Area of focal fatty sparing near the gallbladder fossa. IMPRESSION: Fatty infiltration of the liver. Small amount of gallbladder sludge without cholelithiasis or changes of acute cholecystitis. Electronically Signed   By: Charlett NoseKevin  Dover M.D.   On: 03/12/2016 10:35    Time Spent in minutes  35   Eddie NorthHUNGEL, Blanchard Willhite M.D on 03/12/2016 at 12:04 PM  Between 7am to 7pm - Pager - [phone]  After 7pm go to [email or web address] - password [email or web address]  [facility] -  Office  [phone]

## 2016-03-13 DIAGNOSIS — E0865 Diabetes mellitus due to underlying condition with hyperglycemia: Secondary | ICD-10-CM

## 2016-03-13 DIAGNOSIS — E782 Mixed hyperlipidemia: Secondary | ICD-10-CM

## 2016-03-13 LAB — GLUCOSE, CAPILLARY
GLUCOSE-CAPILLARY: 412 mg/dL — AB (ref 65–99)
GLUCOSE-CAPILLARY: 317 mg/dL — AB (ref 65–99)
Glucose-Capillary: 297 mg/dL — ABNORMAL HIGH (ref 65–99)
Glucose-Capillary: 463 mg/dL — ABNORMAL HIGH (ref 65–99)

## 2016-03-13 LAB — HIV-1 RNA QUANT-NO REFLEX-BLD: LOG10 HIV-1 RNA: UNDETERMINED log10copy/mL

## 2016-03-13 LAB — BASIC METABOLIC PANEL
Anion gap: 10 (ref 5–15)
BUN: 14 mg/dL (ref 6–20)
CO2: 24 mmol/L (ref 22–32)
Calcium: 8.3 mg/dL — ABNORMAL LOW (ref 8.9–10.3)
Chloride: 98 mmol/L — ABNORMAL LOW (ref 101–111)
Creatinine, Ser: 0.98 mg/dL (ref 0.61–1.24)
GFR calc Af Amer: >60 mL/min (ref >60)
GLUCOSE: 312 mg/dL — AB (ref 65–99)
POTASSIUM: 3.6 mmol/L (ref 3.5–5.1)
Sodium: 132 mmol/L — ABNORMAL LOW (ref 135–145)

## 2016-03-13 LAB — HEMOGLOBIN A1C
HEMOGLOBIN A1C: 12.1 % — AB (ref 4.8–5.6)
MEAN PLASMA GLUCOSE: 301 mg/dL

## 2016-03-13 MED ORDER — INSULIN STARTER KIT- SYRINGES (ENGLISH)
1.0000 | Freq: Once | Status: AC
  Administered 2016-03-13: 1
  Filled 2016-03-13: qty 1

## 2016-03-13 MED ORDER — INSULIN DETEMIR 100 UNIT/ML ~~LOC~~ SOLN
45.0000 [IU] | Freq: Every day | SUBCUTANEOUS | Status: DC
  Administered 2016-03-13: 45 [IU] via SUBCUTANEOUS
  Filled 2016-03-13: qty 0.45

## 2016-03-13 MED ORDER — PRAVASTATIN SODIUM 20 MG PO TABS
20.0000 mg | ORAL_TABLET | Freq: Every day | ORAL | Status: DC
  Administered 2016-03-13: 20 mg via ORAL
  Filled 2016-03-13: qty 1

## 2016-03-13 MED ORDER — POTASSIUM CHLORIDE CRYS ER 20 MEQ PO TBCR
40.0000 meq | EXTENDED_RELEASE_TABLET | Freq: Once | ORAL | Status: AC
  Administered 2016-03-13: 40 meq via ORAL
  Filled 2016-03-13: qty 2

## 2016-03-13 MED ORDER — BLOOD GLUCOSE MONITOR KIT: PACK | 0 refills | Status: DC

## 2016-03-13 MED ORDER — INSULIN DETEMIR 100 UNIT/ML ~~LOC~~ SOLN
50.0000 [IU] | Freq: Every day | SUBCUTANEOUS | Status: DC
  Administered 2016-03-14: 50 [IU] via SUBCUTANEOUS
  Filled 2016-03-13: qty 0.5

## 2016-03-13 MED ORDER — LIVING WELL WITH DIABETES BOOK
Freq: Once | Status: AC
  Administered 2016-03-13: 1
  Filled 2016-03-13: qty 1

## 2016-03-13 MED ORDER — INSULIN ASPART 100 UNIT/ML ~~LOC~~ SOLN
12.0000 [IU] | Freq: Once | SUBCUTANEOUS | Status: AC
Start: 2016-03-13 — End: 2016-03-13
  Administered 2016-03-13: 12 [IU] via SUBCUTANEOUS

## 2016-03-13 MED ORDER — INSULIN ASPART 100 UNIT/ML ~~LOC~~ SOLN
10.0000 [IU] | Freq: Three times a day (TID) | SUBCUTANEOUS | Status: DC
  Administered 2016-03-13: 10 [IU] via SUBCUTANEOUS

## 2016-03-13 MED ORDER — INSULIN ASPART 100 UNIT/ML ~~LOC~~ SOLN
8.0000 [IU] | Freq: Three times a day (TID) | SUBCUTANEOUS | Status: DC
  Administered 2016-03-13 (×2): 8 [IU] via SUBCUTANEOUS

## 2016-03-13 NOTE — Progress Notes (Addendum)
PROGRESS NOTE                                                                                                                                                                                                             Patient Demographics:    William Martin, is a 35 y.o. male, DOB - 1980-09-12, ZOX:[id number]  Admit date - 03/11/2016   Admitting Physician Lorretta HarpXilin Niu, MD  Outpatient Primary MD for the patient is No primary care provider on file.  LOS - 2  Outpatient Specialists:None  Chief Complaint  Patient presents with  . Nausea  . Abdominal Pain       Brief Narrative   35 year old obese male with history of GERD presented with 3-4 days of dominant discomfort with nausea and vomiting, polyuria and polydipsia. Patient found to be in DKA with CBG of 701, anion gap of 27 and bicarbonate of 11. He also had acute kidney injury. Admitted to stepdown on insulin drip and IV hydration. Transferred to med floor after gap closed and off insulin drip   Subjective:   cbg in 400s all day. Appears pt drinking significant amount of sodas and milk brought by family.   Assessment  & Plan :    Principal Problem:   DKA (diabetic ketoacidoses) in new onset type 2 DM (HCC) . Reports strong family history (both mom and grandmother had uncontrolled diabetes). Hydrated aggressively and placed on insulin drip. DKA resolved. On lantus with premeal coverage. CBG in 400s all day due to dietary non adherence. Insulin dose adjusted.  his insurance wont start until next month. Will discharge on novolin 70/30 bid along with metformin.  -A1C of 12. Diabetes education provided. -Discussed again at length with patient and his sister at bedside on last modifications, diet adherence, blood glucose monitoring, exercise to lose weight and close outpatient follow-up. Patient does not have a PCP and will provide list of PCPs in the community to establish  care.   Will refer to [place] [facility] to establish care.  Active Problems:    GERD (gastroesophageal reflux disease) Continue PPI    AKI (acute kidney injury) (HCC) Prerenal secondary to dehydration. Resolved with IV fluids.   hypokalemia Replenished  Hypertriglyceridemia Elevated total cholesterol ( 311) with TAG ( 497).  will place on pravastatin . D/c on lovastatin 40 mg daily .  Obesity Counseled on diet and exercise   Code Status : Full code  Family Communication  : Sister at bedside  Disposition Plan  : d/c home in am if cbg better controlled.  Barriers For Discharge :  hyperglycemia Consults  :   Diabetic coordinator  Procedures  : None DVT Prophylaxis  :  Lovenox -   Lab Results  Component Value Date   PLT 305 03/11/2016    Antibiotics  :    Anti-infectives    None        Objective:   Vitals:   03/12/16 1425 03/12/16 2045 03/13/16 0003 03/13/16 0408  BP: 139/76 129/68 117/78 125/69  Pulse:  89 90 87  Resp: 20 18 18 18   Temp: 97.7 F (36.5 C) 98.2 F (36.8 C) 98.3 F (36.8 C) 98.4 F (36.9 C)  TempSrc: Oral Oral Oral Oral  SpO2: 100% 100% 98% 98%  Weight:      Height: 5\' 10"  (1.778 m)       Wt Readings from Last 3 Encounters:  03/11/16 108.9 kg (240 lb)     Intake/Output Summary (Last 24 hours) at 03/13/16 1605 Last data filed at 03/13/16 0900  Gross per 24 hour  Intake             1440 ml  Output              525 ml  Net              915 ml     Physical Exam  Gen: not in distress HEENT:  moist mucosa, supple neck Chest: clear b/l, no added sounds CVS: N S1&S2, no murmurs, GI: soft, NT, ND Musculoskeletal: warm, no edema     Data Review:    CBC  Recent Labs Lab 03/11/16 1917  WBC 12.1*  HGB 14.6  HCT 40.0  PLT 305  MCV 77.8*  MCH 28.4  MCHC 36.5*  RDW 14.4    Chemistries   Recent Labs Lab 03/11/16 1917 03/11/16 2227 03/12/16 0142 03/12/16 0557 03/12/16 0955 03/13/16 0553  NA 123*  130* 136 138 133* 132*  K 5.0 5.2* 4.2 3.8 3.4* 3.6  CL 85* 97* 101 103 101 98*  CO2 11* 16* 16* 20* 20* 24  GLUCOSE 701* 503* 268* 138* 268* 312*  BUN 34* 29* 24* 21* 17 14  CREATININE 1.82* 1.60* 1.39* 1.16 1.06 0.98  CALCIUM 8.8* 8.0* 8.2* 8.4* 7.8* 8.3*  AST 27  --   --   --   --   --   ALT 32  --   --   --   --   --   ALKPHOS 82  --   --   --   --   --   BILITOT 3.6*  --   --   --   --   --    ------------------------------------------------------------------------------------------------------------------  Recent Labs  03/12/16 0557  CHOL 311*  HDL 32*  LDLCALC UNABLE TO CALCULATE IF TRIGLYCERIDE OVER 400 mg/dL  TRIG 960*  CHOLHDL 9.7    Lab Results  Component Value Date   HGBA1C 12.1 (H) 03/12/2016   ------------------------------------------------------------------------------------------------------------------ No results for input(s): TSH, T4TOTAL, T3FREE, THYROIDAB in the last 72 hours.  Invalid input(s): FREET3 ------------------------------------------------------------------------------------------------------------------ No results for input(s): VITAMINB12, FOLATE, FERRITIN, TIBC, IRON, RETICCTPCT in the last 72 hours.  Coagulation profile No results for input(s): INR, PROTIME in the last 168 hours.  No results for input(s): DDIMER in the last 72 hours.  Cardiac Enzymes No results for input(s): CKMB, TROPONINI, MYOGLOBIN in the last 168 hours.  Invalid input(s): CK ------------------------------------------------------------------------------------------------------------------ No results found for: BNP  Inpatient Medications  Scheduled Meds: . enoxaparin (LOVENOX) injection  40 mg Subcutaneous QHS  . insulin aspart  0-20 Units Subcutaneous TID WC  . insulin aspart  0-5 Units Subcutaneous QHS  . insulin aspart  10 Units Subcutaneous TID WC  . insulin aspart  12 Units Subcutaneous Once  . [START ON 03/14/2016] insulin detemir  50 Units  Subcutaneous Daily  . mouth rinse  15 mL Mouth Rinse BID  . pantoprazole  40 mg Oral Q1200   Continuous Infusions:  PRN Meds:.acetaminophen, albuterol, alum & mag hydroxide-simeth, dextromethorphan-guaiFENesin, zolpidem  Micro Results Recent Results (from the past 240 hour(s))  MRSA PCR Screening     Status: None   Collection Time: 03/12/16  2:08 AM  Result Value Ref Range Status   MRSA by PCR NEGATIVE NEGATIVE Final    Comment:        The GeneXpert MRSA Assay (FDA approved for NASAL specimens only), is one component of a comprehensive MRSA colonization surveillance program. It is not intended to diagnose MRSA infection nor to guide or monitor treatment for MRSA infections.     Radiology Reports Dg Chest 2 View  Result Date: 03/11/2016 CLINICAL DATA:  Shortness of breath for the past 2 days. EXAM: CHEST  2 VIEW COMPARISON:  None. FINDINGS: The heart size and mediastinal contours are within normal limits. Both lungs are clear. The visualized skeletal structures are unremarkable. IMPRESSION: Normal examination. Electronically Signed   By: Beckie SaltsSteven  Reid M.D.   On: 03/11/2016 19:25   [place] Abdomen Limited Ruq  Result Date: 03/12/2016 CLINICAL DATA:  Abdominal pain, elevated bilirubin EXAM: US ABDOMEN LIMITED - RIGHT UPPER QUADRANT COMPARISON:  None. FINDINGS: Gallbladder: Small amount of sludge within the gallbladder. No visible stones, wall thickening or sonographic Murphy sign. Common bile duct: Diameter: Normal caliber, 5 mm Liver: Increased echotexture throughout the liver compatible with fatty infiltration. Area of focal fatty sparing near the gallbladder fossa. IMPRESSION: Fatty infiltration of the liver. Small amount of gallbladder sludge without cholelithiasis or changes of acute cholecystitis. Electronically Signed   By: Charlett NoseKevin  Dover M.D.   On: 03/12/2016 10:35    Time Spent in minutes  25   Eddie NorthHUNGEL, Lyndsy Gilberto M.D on 03/13/2016 at 4:05 PM  Between 7am to 7pm - Pager -  [phone]  After 7pm go to [email or web address] - password [email or web address]  [facility] -  Office  [phone]

## 2016-03-13 NOTE — Progress Notes (Signed)
Pt given instructions to watch diabetes management videos. Discussed insulin administration with demonstration. Will continue to educate and reinforce diabetes management and insulin administration.  Justin Mendaudle, Zaliah Wissner H, RN

## 2016-03-13 NOTE — Progress Notes (Signed)
Living w/ Diabetes book provided and reviewed w/ pt. Questions answered. Teaching on use of insulin pens, Pt has administered his own insulin all day. Insulin needle book also provided.  Nursing will continue to reinforce.  Justin Mendaudle, Jailee Jaquez H, RN

## 2016-03-13 NOTE — Progress Notes (Signed)
Inpatient Diabetes Program Recommendations  AACE/ADA: New Consensus Statement on Inpatient Glycemic Control (2015)  Target Ranges:  Prepandial:   less than 140 mg/dL      Peak postprandial:   less than 180 mg/dL (1-2 hours)      Critically ill patients:  140 - 180 mg/dL   Lab Results  Component Value Date   GLUCAP 463 (H) 03/13/2016   HGBA1C 12.1 (H) 03/12/2016    Review of Glycemic Control  Diabetes history: None - New-onset DM Outpatient Diabetes medications: N/A Current orders for Inpatient glycemic control: Levemir 45 units QD, Novolog 0-20 units tidwc and hs + 8 units tidwc for meal coverage  Newly diagnosed Type 2 DM with HgbA1C of 12.1%. Increased basal insulin to 45 units QD Added meal coverage beginning this am. Pt appears very insulin resistant.  Inpatient Diabetes Program Recommendations:    Pt will need affordable insulin at discharge with no insurance. Recommend 70/30 bid + Novolin R for correction. ? Whether pt can f/u at [facility]. Will need glucose meter and supplies.  Orders placed for OP Ed, starter insulin kit, DM videos and Living Well book. Will see pt in am to review basic education.  Thank you. Lorenda Peck, RD, LDN, CDE Inpatient Diabetes Coordinator [phone]

## 2016-03-14 DIAGNOSIS — Z720 Tobacco use: Secondary | ICD-10-CM

## 2016-03-14 DIAGNOSIS — E876 Hypokalemia: Secondary | ICD-10-CM | POA: Diagnosis present

## 2016-03-14 DIAGNOSIS — E785 Hyperlipidemia, unspecified: Secondary | ICD-10-CM

## 2016-03-14 DIAGNOSIS — E781 Pure hyperglyceridemia: Secondary | ICD-10-CM

## 2016-03-14 DIAGNOSIS — E1110 Type 2 diabetes mellitus with ketoacidosis without coma: Secondary | ICD-10-CM

## 2016-03-14 DIAGNOSIS — E1169 Type 2 diabetes mellitus with other specified complication: Secondary | ICD-10-CM

## 2016-03-14 DIAGNOSIS — N179 Acute kidney failure, unspecified: Secondary | ICD-10-CM

## 2016-03-14 DIAGNOSIS — E669 Obesity, unspecified: Secondary | ICD-10-CM | POA: Diagnosis present

## 2016-03-14 DIAGNOSIS — E0810 Diabetes mellitus due to underlying condition with ketoacidosis without coma: Secondary | ICD-10-CM

## 2016-03-14 LAB — GLUCOSE, CAPILLARY
GLUCOSE-CAPILLARY: 270 mg/dL — AB (ref 65–99)
GLUCOSE-CAPILLARY: 411 mg/dL — AB (ref 65–99)
GLUCOSE-CAPILLARY: 351 mg/dL — AB (ref 65–99)

## 2016-03-14 MED ORDER — INSULIN NPH ISOPHANE & REGULAR (70-30) 100 UNIT/ML ~~LOC~~ SUSP: 45.0000 [IU] | Freq: Two times a day (BID) | SUBCUTANEOUS | 7 refills | Status: DC

## 2016-03-14 MED ORDER — LOVASTATIN 40 MG PO TABS: 40.0000 mg | ORAL_TABLET | Freq: Every day | ORAL | 0 refills | Status: DC

## 2016-03-14 MED ORDER — METFORMIN HCL 500 MG PO TABS: 500.0000 mg | ORAL_TABLET | Freq: Two times a day (BID) | ORAL | 1 refills | Status: DC

## 2016-03-14 MED ORDER — INSULIN ASPART 100 UNIT/ML ~~LOC~~ SOLN: 14.0000 [IU] | Freq: Three times a day (TID) | SUBCUTANEOUS | Status: DC

## 2016-03-14 MED ORDER — "INSULIN SYRINGE 28G X 1/2"" 1 ML MISC": 1.0000 "application " | Freq: Two times a day (BID) | 2 refills | Status: DC

## 2016-03-14 MED ORDER — ALCOHOL PADS 70 % PADS: 1.0000 "application " | MEDICATED_PAD | Freq: Four times a day (QID) | 3 refills | Status: DC

## 2016-03-14 MED ORDER — INSULIN ASPART 100 UNIT/ML ~~LOC~~ SOLN
15.0000 [IU] | Freq: Three times a day (TID) | SUBCUTANEOUS | Status: DC
  Administered 2016-03-14: 15 [IU] via SUBCUTANEOUS

## 2016-03-14 NOTE — Progress Notes (Signed)
Date: March 14, 2016 Discharge orders checked for needs. Patient given information [facility] to obtain a pcp and for being a new diabetic.Marland Kitchen. Marcelle Smilinghonda Davis, RN, BSN, [place]   [phone]

## 2016-03-14 NOTE — Discharge Summary (Signed)
Physician Discharge Summary  Kobe Ofallon DTO:[id number] DOB: 1980-05-24 DOA: 03/11/2016  PCP: No primary care provider on file.  Admit date: 03/11/2016 Discharge date: 03/14/2016  Admitted From: Home Disposition:  Home  Recommendations for Outpatient Follow-up:  1. Given information to establish care at outpatient [facility] within 4 weeks. Provided prescription for insulin, metformin and diabetic supplies.  Home Health: None Equipment/Devices: None  Discharge Condition: Fair CODE STATUS: Full code Diet recommendation: Carb mortified, heart healthy    Discharge Diagnoses:  Principal Problem:   DKA (diabetic ketoacidoses) ([place])  Active Problems:   New onset type 2 diabetes mellitus (HCC)   Tobacco abuse   GERD (gastroesophageal reflux disease)   AKI (acute kidney injury) ([place])   Obesity   Hypertriglyceridemia   Hyperlipidemia due to type 2 diabetes mellitus ([place])   Hypokalemia  Brief narrative/history of present illness 35 year old obese male with history of GERD presented with 3-4 days of dominant discomfort with nausea and vomiting, polyuria and polydipsia. Patient found to be in DKA with CBG of 701, anion gap of 27 and bicarbonate of 11. He also had acute kidney injury. Admitted to stepdown on insulin drip and IV hydration. Transferred to med floor after gap closed and off insulin drip.   Principal Problem:   DKA (diabetic ketoacidoses) in new onset type 2 DM ([place]) -Reports strong family history (both mom and grandmother had uncontrolled diabetes). --A1C of 12%. -Hydrated aggressively and placed on insulin drip. DKA resolved. Placed On lantus with premeal coverage. CBG has been elevated in 300s which I believe is due to patient's nonadherence to meals. Insulin dose adjusted.  his insurance wont start until next month.  -Will discharge on novolin 70/30 (45 units) bid along with metformin 500 mg twice daily. -Dose needs to be titrated during outpatient  follow-up. Patient also instructed to keep a log of his blood glucose readings.  -Diabetes education provided. -Discussed again at length with patient and his sister at bedside on last modifications, diet adherence, blood glucose monitoring, exercise to lose weight and close outpatient follow-up.  Patient does not have a PCP and have provided information on [facility] to establish care.   Will refer to [facility] to establish care.  Active Problems:    GERD (gastroesophageal reflux disease) Continue PPI    AKI (acute kidney injury) ([place]) Prerenal secondary to dehydration. Resolved with IV fluids.   hypokalemia Replenished  Hypertriglyceridemia Elevated total cholesterol ( 311) with TAG ( 497).   was discharged on lovastatin 40 mg daily (will be affordable for him to buy at [facility] due to insurance issues)  Obesity Counseled on diet and exercise     Family Communication  :  discussed with sister at bedside  Disposition Plan  :  home Consults  :   Diabetic coordinator  Procedures  : None  Discharge Instructions   Allergies as of 03/14/2016      Reactions   Bee Venom Swelling      Medication List    TAKE these medications   Alcohol Pads 70 % Pads 1 application by Does not apply route 4 (four) times daily.   blood glucose meter kit and supplies Kit Dispense based on patient and insurance preference. Use up to four times daily as directed. (FOR ICD-9 250.00, 250.01).   insulin NPH-regular Human (70-30) 100 UNIT/ML injection Commonly known as:  NOVOLIN 70/30 Inject 45 Units into the skin 2 (two) times daily with a meal.   INSULIN  SYRINGE 1CC/28G 28G X 1/2" 1 ML Misc 1 application by Does not apply route 2 (two) times daily.   lovastatin 40 MG tablet Commonly known as:  MEVACOR Take 1 tablet (40 mg total) by mouth at bedtime.   metFORMIN 500 MG tablet Commonly known as:  GLUCOPHAGE Take 1 tablet (500 mg  total) by mouth 2 (two) times daily with a meal.   omeprazole 20 MG tablet Commonly known as:  PRILOSEC OTC Take 20 mg by mouth daily as needed (reflux/heartburn.).      Follow-up Information    [facility]. Call in 1 week(s).   Contact information: [place] [phone]         Allergies  Allergen Reactions  . Bee Venom Swelling      Procedures/Studies: Dg Chest 2 View  Result Date: 03/11/2016 CLINICAL DATA:  Shortness of breath for the past 2 days. EXAM: CHEST  2 VIEW COMPARISON:  None. FINDINGS: The heart size and mediastinal contours are within normal limits. Both lungs are clear. The visualized skeletal structures are unremarkable. IMPRESSION: Normal examination. Electronically Signed   By: Claudie Revering M.D.   On: 03/11/2016 19:25   US Abdomen Limited Ruq  Result Date: 03/12/2016 CLINICAL DATA:  Abdominal pain, elevated bilirubin EXAM: US ABDOMEN LIMITED - RIGHT UPPER QUADRANT COMPARISON:  None. FINDINGS: Gallbladder: Small amount of sludge within the gallbladder. No visible stones, wall thickening or sonographic Murphy sign. Common bile duct: Diameter: Normal caliber, 5 mm Liver: Increased echotexture throughout the liver compatible with fatty infiltration. Area of focal fatty sparing near the gallbladder fossa. IMPRESSION: Fatty infiltration of the liver. Small amount of gallbladder sludge without cholelithiasis or changes of acute cholecystitis. Electronically Signed   By: Rolm Baptise M.D.   On: 03/12/2016 10:35     Subjective: Denies polyuria polydipsia. CBG in the range of 250-300  Discharge Exam: Vitals:   03/14/16 0024 03/14/16 0427  BP: 105/60 109/69  Pulse: 91 90  Resp:    Temp: 98.3 F (36.8 C) 98.4 F (36.9 C)   Vitals:   03/13/16 0408 03/13/16 2012 03/14/16 0024 03/14/16 0427  BP: 125/69 121/83 105/60 109/69  Pulse: 87 95 91 90  Resp: 18 18    Temp: 98.4 F (36.9 C)  98.4 F (36.9 C) 98.3 F (36.8 C) 98.4 F (36.9 C)  TempSrc: Oral Oral Oral Oral  SpO2: 98% 100% 100% 100%  Weight:      Height:         Gen: not in distress HEENT:  moist mucosa, supple neck Chest: clear b/l, no added sounds CVS: N S1&S2, no murmurs, GI: soft, NT, ND Musculoskeletal: warm, no edema     The results of significant diagnostics from this hospitalization (including imaging, microbiology, ancillary and laboratory) are listed below for reference.     Microbiology: Recent Results (from the past 240 hour(s))  MRSA PCR Screening     Status: None   Collection Time: 03/12/16  2:08 AM  Result Value Ref Range Status   MRSA by PCR NEGATIVE NEGATIVE Final    Comment:        The GeneXpert MRSA Assay (FDA approved for NASAL specimens only), is one component of a comprehensive MRSA colonization surveillance program. It is not intended to diagnose MRSA infection nor to guide or monitor treatment for MRSA infections.      Labs: BNP (last 3 results) No results for input(s): BNP in the last  8760 hours. Basic Metabolic Panel:  Recent Labs Lab 03/11/16 2227 03/12/16 0142 03/12/16 0557 03/12/16 0955 03/13/16 0553  NA 130* 136 138 133* 132*  K 5.2* 4.2 3.8 3.4* 3.6  CL 97* 101 103 101 98*  CO2 16* 16* 20* 20* 24  GLUCOSE 503* 268* 138* 268* 312*  BUN 29* 24* 21* 17 14  CREATININE 1.60* 1.39* 1.16 1.06 0.98  CALCIUM 8.0* 8.2* 8.4* 7.8* 8.3*   Liver Function Tests:  Recent Labs Lab 03/11/16 1917  AST 27  ALT 32  ALKPHOS 82  BILITOT 3.6*  PROT 9.3*  ALBUMIN 4.7    Recent Labs Lab 03/11/16 1917  LIPASE 37   No results for input(s): AMMONIA in the last 168 hours. CBC:  Recent Labs Lab 03/11/16 1917  WBC 12.1*  HGB 14.6  HCT 40.0  MCV 77.8*  PLT 305   Cardiac Enzymes: No results for input(s): CKTOTAL, CKMB, CKMBINDEX, TROPONINI in the last 168 hours. BNP: Invalid input(s): POCBNP CBG:  Recent Labs Lab 03/13/16 0724  03/13/16 1150 03/13/16 1601 03/13/16 2008 03/14/16 0735  GLUCAP 297* 463* 412* 317* 270*   D-Dimer No results for input(s): DDIMER in the last 72 hours. Hgb A1c  Recent Labs  03/12/16 0557  HGBA1C 12.1*   Lipid Profile  Recent Labs  03/12/16 0557  CHOL 311*  HDL 32*  LDLCALC UNABLE TO CALCULATE IF TRIGLYCERIDE OVER 400 mg/dL  TRIG 497*  CHOLHDL 9.7   Thyroid function studies No results for input(s): TSH, T4TOTAL, T3FREE, THYROIDAB in the last 72 hours.  Invalid input(s): FREET3 Anemia work up No results for input(s): VITAMINB12, FOLATE, FERRITIN, TIBC, IRON, RETICCTPCT in the last 72 hours. Urinalysis    Component Value Date/Time   COLORURINE STRAW (A) 03/11/2016 1920   APPEARANCEUR CLEAR 03/11/2016 1920   LABSPEC 1.020 03/11/2016 1920   PHURINE 5.0 03/11/2016 1920   GLUCOSEU >=500 (A) 03/11/2016 1920   HGBUR SMALL (A) 03/11/2016 1920   BILIRUBINUR NEGATIVE 03/11/2016 1920   KETONESUR 80 (A) 03/11/2016 1920   PROTEINUR NEGATIVE 03/11/2016 1920   NITRITE NEGATIVE 03/11/2016 1920   LEUKOCYTESUR NEGATIVE 03/11/2016 1920   Sepsis Labs Invalid input(s): PROCALCITONIN,  WBC,  LACTICIDVEN Microbiology Recent Results (from the past 240 hour(s))  MRSA PCR Screening     Status: None   Collection Time: 03/12/16  2:08 AM  Result Value Ref Range Status   MRSA by PCR NEGATIVE NEGATIVE Final    Comment:        The GeneXpert MRSA Assay (FDA approved for NASAL specimens only), is one component of a comprehensive MRSA colonization surveillance program. It is not intended to diagnose MRSA infection nor to guide or monitor treatment for MRSA infections.      Time coordinating discharge: Over 30 minutes  SIGNED:   Louellen Molder, MD  [facility] 03/14/2016, 10:13 AM Pager   If 7PM-7AM, please contact night-coverage [email or web address] Password [email or web address]

## 2016-03-14 NOTE — Discharge Instructions (Signed)
Diabetes Mellitus and Food It is important for you to manage your blood sugar (glucose) level. Your blood glucose level can be greatly affected by what you eat. Eating healthier foods in the appropriate amounts throughout the day at about the same time each day will help you control your blood glucose level. It can also help slow or prevent worsening of your diabetes mellitus. Healthy eating may even help you improve the level of your blood pressure and reach or maintain a healthy weight. General recommendations for healthful eating and cooking habits include:  Eating meals and snacks regularly. Avoid going long periods of time without eating to lose weight.  Eating a diet that consists mainly of plant-based foods, such as fruits, vegetables, nuts, legumes, and whole grains.  Using low-heat cooking methods, such as baking, instead of high-heat cooking methods, such as deep frying. Work with your dietitian to make sure you understand how to use the Nutrition Facts information on food labels. How can food affect me? Carbohydrates  Carbohydrates affect your blood glucose level more than any other type of food. Your dietitian will help you determine how many carbohydrates to eat at each meal and teach you how to count carbohydrates. Counting carbohydrates is important to keep your blood glucose at a healthy level, especially if you are using insulin or taking certain medicines for diabetes mellitus. Alcohol  Alcohol can cause sudden decreases in blood glucose (hypoglycemia), especially if you use insulin or take certain medicines for diabetes mellitus. Hypoglycemia can be a life-threatening condition. Symptoms of hypoglycemia (sleepiness, dizziness, and disorientation) are similar to symptoms of having too much alcohol. If your health care provider has given you approval to drink alcohol, do so in moderation and use the following guidelines:  Women should not have more than one drink per day, and men  should not have more than two drinks per day. One drink is equal to:  12 oz of beer.  5 oz of wine.  1 oz of hard liquor.  Do not drink on an empty stomach.  Keep yourself hydrated. Have water, diet soda, or unsweetened iced tea.  Regular soda, juice, and other mixers might contain a lot of carbohydrates and should be counted. What foods are not recommended? As you make food choices, it is important to remember that all foods are not the same. Some foods have fewer nutrients per serving than other foods, even though they might have the same number of calories or carbohydrates. It is difficult to get your body what it needs when you eat foods with fewer nutrients. Examples of foods that you should avoid that are high in calories and carbohydrates but low in nutrients include:  Trans fats (most processed foods list trans fats on the Nutrition Facts label).  Regular soda.  Juice.  Candy.  Sweets, such as cake, pie, doughnuts, and cookies.  Fried foods. What foods can I eat? Eat nutrient-rich foods, which will nourish your body and keep you healthy. The food you should eat also will depend on several factors, including:  The calories you need.  The medicines you take.  Your weight.  Your blood glucose level.  Your blood pressure level.  Your cholesterol level. You should eat a variety of foods, including:  Protein.  Lean cuts of meat.  Proteins low in saturated fats, such as fish, egg whites, and beans. Avoid processed meats.  Fruits and vegetables.  Fruits and vegetables that may help control blood glucose levels, such as apples, mangoes, and   yams.  Dairy products.  Choose fat-free or low-fat dairy products, such as milk, yogurt, and cheese.  Grains, bread, pasta, and rice.  Choose whole grain products, such as multigrain bread, whole oats, and brown rice. These foods may help control blood pressure.  Fats.  Foods containing healthful fats, such as nuts,  avocado, olive oil, canola oil, and fish. Does everyone with diabetes mellitus have the same meal plan? Because every person with diabetes mellitus is different, there is not one meal plan that works for everyone. It is very important that you meet with a dietitian who will help you create a meal plan that is just right for you. This information is not intended to replace advice given to you by your health care provider. Make sure you discuss any questions you have with your health care provider. Document Released: 12/09/2004 Document Revised: 08/20/2015 Document Reviewed: 02/08/2013 Elsevier Interactive Patient Education  2017 Elsevier Inc. Diabetes Mellitus and Exercise Exercising regularly is important for your overall health, especially when you have diabetes (diabetes mellitus). Exercising is not only about losing weight. It has many health benefits, such as increasing muscle strength and bone density and reducing body fat and stress. This leads to improved fitness, flexibility, and endurance, all of which result in better overall health. Exercise has additional benefits for people with diabetes, including:  Reducing appetite.  Helping to lower and control blood glucose.  Lowering blood pressure.  Helping to control amounts of fatty substances (lipids) in the blood, such as cholesterol and triglycerides.  Helping the body to respond better to insulin (improving insulin sensitivity).  Reducing how much insulin the body needs.  Decreasing the risk for heart disease by:  Lowering cholesterol and triglyceride levels.  Increasing the levels of good cholesterol.  Lowering blood glucose levels. What is my activity plan? Your health care provider or certified diabetes educator can help you make a plan for the type and frequency of exercise (activity plan) that works for you. Make sure that you:  Do at least 150 minutes of moderate-intensity or vigorous-intensity exercise each week. This  could be brisk walking, biking, or water aerobics.  Do stretching and strength exercises, such as yoga or weightlifting, at least 2 times a week.  Spread out your activity over at least 3 days of the week.  Get some form of physical activity every day.  Do not go more than 2 days in a row without some kind of physical activity.  Avoid being inactive for more than 90 minutes at a time. Take frequent breaks to walk or stretch.  Choose a type of exercise or activity that you enjoy, and set realistic goals.  Start slowly, and gradually increase the intensity of your exercise over time. What do I need to know about managing my diabetes?  Check your blood glucose before and after exercising.  If your blood glucose is higher than 240 mg/dL (13.3 mmol/L) before you exercise, check your urine for ketones. If you have ketones in your urine, do not exercise until your blood glucose returns to normal.  Know the symptoms of low blood glucose (hypoglycemia) and how to treat it. Your risk for hypoglycemia increases during and after exercise. Common symptoms of hypoglycemia can include:  Hunger.  Anxiety.  Sweating and feeling clammy.  Confusion.  Dizziness or feeling light-headed.  Increased heart rate or palpitations.  Blurry vision.  Tingling or numbness around the mouth, lips, or tongue.  Tremors or shakes.  Irritability.  Keep a rapid-acting   carbohydrate snack available before, during, and after exercise to help prevent or treat hypoglycemia.  Avoid injecting insulin into areas of the body that are going to be exercised. For example, avoid injecting insulin into:  The arms, when playing tennis.  The legs, when jogging.  Keep records of your exercise habits. Doing this can help you and your health care provider adjust your diabetes management plan as needed. Write down:  Food that you eat before and after you exercise.  Blood glucose levels before and after you  exercise.  The type and amount of exercise you have done.  When your insulin is expected to peak, if you use insulin. Avoid exercising at times when your insulin is peaking.  When you start a new exercise or activity, work with your health care provider to make sure the activity is safe for you, and to adjust your insulin, medicines, or food intake as needed.  Drink plenty of water while you exercise to prevent dehydration or heat stroke. Drink enough fluid to keep your urine clear or pale yellow. This information is not intended to replace advice given to you by your health care provider. Make sure you discuss any questions you have with your health care provider. Document Released: 06/04/2003 Document Revised: 10/02/2015 Document Reviewed: 08/24/2015 Elsevier Interactive Patient Education  2017 [employer]vier Inc. Blood Glucose Monitoring, Adult Monitoring your blood sugar (glucose) helps you manage your diabetes. It also helps you and your health care provider determine how well your diabetes management plan is working. Blood glucose monitoring involves checking your blood glucose as often as directed, and keeping a record (log) of your results over time. Why should I monitor my blood glucose? Checking your blood glucose regularly can:  Help you understand how food, exercise, illnesses, and medicines affect your blood glucose.  Let you know what your blood glucose is at any time. You can quickly tell if you are having low blood glucose (hypoglycemia) or high blood glucose (hyperglycemia).  Help you and your health care provider adjust your medicines as needed. When should I check my blood glucose? Follow instructions from your health care provider about how often to check your blood glucose. This may depend on:  The type of diabetes you have.  How well-controlled your diabetes is.  Medicines you are taking. If you have type 1 diabetes:   Check your blood glucose at least 2 times a  day.  Also check your blood glucose:  Before every insulin injection.  Before and after exercise.  Between meals.  2 hours after a meal.  Occasionally between 2:00 a.m. and 3:00 a.m., as directed.  Before potentially dangerous tasks, like driving or using heavy machinery.  At bedtime.  You may need to check your blood glucose more often, up to 6-10 times a day:  If you use an insulin pump.  If you need multiple daily injections (MDI).  If your diabetes is not well-controlled.  If you are ill.  If you have a history of severe hypoglycemia.  If you have a history of not knowing when your blood glucose is getting low (hypoglycemia unawareness). If you have type 2 diabetes:   If you take insulin or other diabetes medicines, check your blood glucose at least 2 times a day.  If you are on intensive insulin therapy, check your blood glucose at least 4 times a day. Occasionally, you may also need to check between 2:00 a.m. and 3:00 a.m., as directed.  Also check your blood glucose:    Before and after exercise.  Before potentially dangerous tasks, like driving or using heavy machinery.  You may need to check your blood glucose more often if:  Your medicine is being adjusted.  Your diabetes is not well-controlled.  You are ill. What is a blood glucose log?  A blood glucose log is a record of your blood glucose readings. It helps you and your health care provider:  Look for patterns in your blood glucose over time.  Adjust your diabetes management plan as needed.  Every time you check your blood glucose, write down your result and notes about things that may be affecting your blood glucose, such as your diet and exercise for the day.  Most glucose meters store a record of glucose readings in the meter. Some meters allow you to download your records to a computer. How do I check my blood glucose? Follow these steps to get accurate readings of your blood  glucose: Supplies needed    Blood glucose meter.  Test strips for your meter. Each meter has its own strips. You must use the strips that come with your meter.  A needle to prick your finger (lancet). Do not use lancets more than once.  A device that holds the lancet (lancing device).  A journal or log book to write down your results. Procedure   Wash your hands with soap and water.  Prick the side of your finger (not the tip) with the lancet. Use a different finger each time.  Gently rub the finger until a small drop of blood appears.  Follow instructions that come with your meter for inserting the test strip, applying blood to the strip, and using your blood glucose meter.  Write down your result and any notes. Alternative testing sites   Some meters allow you to use areas of your body other than your finger (alternative sites) to test your blood.  If you think you may have hypoglycemia, or if you have hypoglycemia unawareness, do not use alternative sites. Use your finger instead.  Alternative sites may not be as accurate as the fingers, because blood flow is slower in these areas. This means that the result you get may be delayed, and it may be different from the result that you would get from your finger.  The most common alternative sites are:  Forearm.  Thigh.  Palm of the hand. Additional tips   Always keep your supplies with you.  If you have questions or need help, all blood glucose meters have a 24-hour "hotline" number that you can call. You may also contact your health care provider.  After you use a few boxes of test strips, adjust (calibrate) your blood glucose meter by following instructions that came with your meter. This information is not intended to replace advice given to you by your health care provider. Make sure you discuss any questions you have with your health care provider. Document Released: 03/17/2003 Document Revised: 10/02/2015 Document  Reviewed: 08/24/2015 Elsevier Interactive Patient Education  2017 [employer]vier Inc.  

## 2016-03-14 NOTE — Progress Notes (Signed)
Inpatient Diabetes Program Recommendations  AACE/ADA: New Consensus Statement on Inpatient Glycemic Control (2015)  Target Ranges:  Prepandial:   less than 140 mg/dL      Peak postprandial:   less than 180 mg/dL (1-2 hours)      Critically ill patients:  140 - 180 mg/dL   Lab Results  Component Value Date   GLUCAP 411 (H) 03/14/2016   HGBA1C 12.1 (H) 03/12/2016    Review of Glycemic Control Spoke with patient about new diabetes diagnosis.  Discussed A1C results (12.1%) and explained what an A1C is. Discussed basic pathophysiology of DM Type 2, basic home care, importance of checking CBGs and maintaining good CBG control to prevent long-term and short-term complications. Reviewed glucose and A1C goals and explained that patient will need to continue to  Reviewed signs and symptoms of hyperglycemia and hypoglycemia along with treatment for both. Discussed impact of nutrition, exercise, stress, sickness, and medications on diabetes control. Reviewed Living Well with diabetes booklet and encouraged patient to read through entire book. Informed patient that he will be prescribed Novolin 70/30 since it is more affordable. Informed patient that Nov[facility] 70/30 [facility] per [facility]ial.  To be d/ced on metformin 500 bid and NPH 70/30 45 units bid. Stressed importance of f/u with PCP for diabetes management. Pt [facility]. Pt voiced understanding.  Thank you. Ailene Ardshonda Karlos Scadden, RD, LDN, CDE Inpatient Diabetes Coordinator [phone]

## 2016-03-14 NOTE — Progress Notes (Signed)
Went over d/c summary and reinforced diabetic education.  Patient verbalized understanding.  Left hospital with family.

## 2016-03-15 MED FILL — !NOVOLIN 70/30 100 UNITS/ML: (70-30) 100 | 22 days supply | Qty: 10 | Fill #0

## 2016-03-17 ENCOUNTER — Ambulatory Visit: Payer: Self-pay | Attending: Internal Medicine | Admitting: Physician Assistant

## 2016-03-17 VITALS — BP 143/91 | HR 108 | Temp 98.7°F | Resp 20 | Ht 70.0 in | Wt 285.0 lb

## 2016-03-17 DIAGNOSIS — I10 Essential (primary) hypertension: Secondary | ICD-10-CM | POA: Insufficient documentation

## 2016-03-17 DIAGNOSIS — E119 Type 2 diabetes mellitus without complications: Secondary | ICD-10-CM

## 2016-03-17 DIAGNOSIS — Z79899 Other long term (current) drug therapy: Secondary | ICD-10-CM | POA: Insufficient documentation

## 2016-03-17 DIAGNOSIS — Z794 Long term (current) use of insulin: Secondary | ICD-10-CM | POA: Insufficient documentation

## 2016-03-17 DIAGNOSIS — E860 Dehydration: Secondary | ICD-10-CM | POA: Insufficient documentation

## 2016-03-17 DIAGNOSIS — N179 Acute kidney failure, unspecified: Secondary | ICD-10-CM | POA: Insufficient documentation

## 2016-03-17 DIAGNOSIS — E1110 Type 2 diabetes mellitus with ketoacidosis without coma: Secondary | ICD-10-CM | POA: Insufficient documentation

## 2016-03-17 DIAGNOSIS — E871 Hypo-osmolality and hyponatremia: Secondary | ICD-10-CM | POA: Insufficient documentation

## 2016-03-17 LAB — BASIC METABOLIC PANEL
BUN: 10 mg/dL (ref 7–25)
CHLORIDE: 103 mmol/L (ref 98–110)
CO2: 21 mmol/L (ref 20–31)
Calcium: 8.8 mg/dL (ref 8.6–10.3)
Creat: 0.89 mg/dL (ref 0.60–1.35)
Glucose, Bld: 330 mg/dL — ABNORMAL HIGH (ref 65–99)
POTASSIUM: 4.2 mmol/L (ref 3.5–5.3)
Sodium: 134 mmol/L — ABNORMAL LOW (ref 135–146)

## 2016-03-17 LAB — GLUCOSE, POCT (MANUAL RESULT ENTRY): POC GLUCOSE: 303 mg/dL — AB (ref 70–99)

## 2016-03-17 MED ORDER — LISINOPRIL 10 MG PO TABS: 10.0000 mg | ORAL_TABLET | Freq: Every day | ORAL | 3 refills | Status: DC

## 2016-03-17 MED ORDER — METFORMIN HCL 1000 MG PO TABS
1000.0000 mg | ORAL_TABLET | Freq: Two times a day (BID) | ORAL | 3 refills | Status: DC
Start: 2016-03-17 — End: 2016-11-12

## 2016-03-17 MED FILL — LISINOPRIL 10 MG TABLET: 10 | 30 days supply | Qty: 30 | Fill #0

## 2016-03-17 MED FILL — metFORMIN HCL 1000 MG TABS: 1000 | 30 days supply | Qty: 60 | Fill #0

## 2016-03-17 NOTE — Patient Instructions (Signed)
Check blood sugars 3 times daily and record and bring to next visit. Drink 100+ ounces of water daily.  Carbohydrate Counting for Diabetes Mellitus, Adult Carbohydrate counting is a method for keeping track of how many carbohydrates you eat. Eating carbohydrates naturally increases the amount of sugar (glucose) in the blood. Counting how many carbohydrates you eat helps keep your blood glucose within normal limits, which helps you manage your diabetes (diabetes mellitus). It is important to know how many carbohydrates you can safely have in each meal. This is different for every person. A diet and nutrition specialist (registered dietitian) can help you make a meal plan and calculate how many carbohydrates you should have at each meal and snack. Carbohydrates are found in the following foods:  Grains, such as breads and cereals.  Dried beans and soy products.  Starchy vegetables, such as potatoes, peas, and corn.  Fruit and fruit juices.  Milk and yogurt.  Sweets and snack foods, such as cake, cookies, candy, chips, and soft drinks. How do I count carbohydrates? There are two ways to count carbohydrates in food. You can use either of the methods or a combination of both. Reading "Nutrition Facts" on packaged food  The "Nutrition Facts" list is included on the labels of almost all packaged foods and beverages in the [place] It includes:  The serving size.  Information about nutrients in each serving, including the grams (g) of carbohydrate per serving. To use the "Nutrition Facts":  Decide how many servings you will have.  Multiply the number of servings by the number of carbohydrates per serving.  The resulting number is the total amount of carbohydrates that you will be having. Learning standard serving sizes of other foods  When you eat foods containing carbohydrates that are not packaged or do not include "Nutrition Facts" on the label, you need to measure the servings in order to  count the amount of carbohydrates:  Measure the foods that you will eat with a food scale or measuring cup, if needed.  Decide how many standard-size servings you will eat.  Multiply the number of servings by 15. Most carbohydrate-rich foods have about 15 g of carbohydrates per serving.  For example, if you eat 8 oz (170 g) of strawberries, you will have eaten 2 servings and 30 g of carbohydrates (2 servings x 15 g = 30 g).  For foods that have more than one food mixed, such as soups and casseroles, you must count the carbohydrates in each food that is included. The following list contains standard serving sizes of common carbohydrate-rich foods. Each of these servings has about 15 g of carbohydrates:   hamburger bun or  English muffin.   oz (15 mL) syrup.   oz (14 g) jelly.  1 slice of bread.  1 six-inch tortilla.  3 oz (85 g) cooked rice or pasta.  4 oz (113 g) cooked dried beans.  4 oz (113 g) starchy vegetable, such as peas, corn, or potatoes.  4 oz (113 g) hot cereal.  4 oz (113 g) mashed potatoes or  of a large baked potato.  4 oz (113 g) canned or frozen fruit.  4 oz (120 mL) fruit juice.  4-6 crackers.  6 chicken nuggets.  6 oz (170 g) unsweetened dry cereal.  6 oz (170 g) plain fat-free yogurt or yogurt sweetened with artificial sweeteners.  8 oz (240 mL) milk.  8 oz (170 g) fresh fruit or one small piece of fruit.  24 oz (  680 g) popped popcorn. Example of carbohydrate counting Sample meal  3 oz (85 g) chicken breast.  6 oz (170 g) brown rice.  4 oz (113 g) corn.  8 oz (240 mL) milk.  8 oz (170 g) strawberries with sugar-free whipped topping. Carbohydrate calculation 1. Identify the foods that contain carbohydrates:  Rice.  Corn.  Milk.  Strawberries. 2. Calculate how many servings you have of each food:  2 servings rice.  1 serving corn.  1 serving milk.  1 serving strawberries. 3. Multiply each number of servings by 15  g:  2 servings rice x 15 g = 30 g.  1 serving corn x 15 g = 15 g.  1 serving milk x 15 g = 15 g.  1 serving strawberries x 15 g = 15 g. 4. Add together all of the amounts to find the total grams of carbohydrates eaten:  30 g + 15 g + 15 g + 15 g = 75 g of carbohydrates total. This information is not intended to replace advice given to you by your health care provider. Make sure you discuss any questions you have with your health care provider. Document Released: 03/14/2005 Document Revised: 10/02/2015 Document Reviewed: 08/26/2015 Elsevier Interactive Patient Education  2017 [employer]

## 2016-03-17 NOTE — Progress Notes (Signed)
Follow up diabetes.

## 2016-03-17 NOTE — Progress Notes (Signed)
William Martin, is a 35 y.o. male  FAO:[id number]  ONG:[id number]  DOB - [birth year]  Subjective:  Chief Complaint and HPI: William Martin is a 35 y.o. male here today to establish care and for a follow up visit after being hospitalized for newly diagnosed diabetes with ketoacidosis 03/11/2016-03/14/2016.  He is doing fine without any complaints.  He is taking 45 units NPH bid and has started on 547m metformin bid.  His blood sugars have been 300-400 since leaving the hospital.  He is trying to drink more water and cut sugars out of his diet.   He denies polyuria/polydipsia  ED/Hospital notes/labs reviewed.     ROS:   Constitutional:  No f/c, No night sweats, No unexplained weight loss. EENT:  No vision changes, No blurry vision, No hearing changes. No mouth, throat, or ear problems.  Respiratory: No cough, No SOB Cardiac: No CP, no palpitations GI:  No abd pain, No N/V/D. GU: No Urinary s/sx Musculoskeletal: No joint pain Neuro: No headache, no dizziness, no motor weakness.  Skin: No rash Endocrine:  No polydipsia. No polyuria.  Psych: Denies SI/HI  No problems updated.  ALLERGIES: Allergies  Allergen Reactions  . Bee Venom Swelling    PAST MEDICAL HISTORY: Past Medical History:  Diagnosis Date  . GERD (gastroesophageal reflux disease)   . Tobacco abuse     MEDICATIONS AT HOME: Prior to Admission medications   Medication Sig Start Date End Date Taking? Authorizing Provider  insulin NPH-regular Human (NOVOLIN 70/30) (70-30) 100 UNIT/ML injection Inject 45 Units into the skin 2 (two) times daily with a meal. 03/14/16  Yes Nishant Dhungel, MD  lovastatin (MEVACOR) 40 MG tablet Take 1 tablet (40 mg total) by mouth at bedtime. 03/14/16  Yes Nishant Dhungel, MD  omeprazole (PRILOSEC OTC) 20 MG tablet Take 20 mg by mouth daily as needed (reflux/heartburn.).   Yes Historical Provider, MD  Alcohol Swabs (ALCOHOL PADS) 70 % PADS 1 application by Does not apply route 4 (four)  times daily. 03/14/16   Nishant Dhungel, MD  blood glucose meter kit and supplies KIT Dispense based on patient and insurance preference. Use up to four times daily as directed. (FOR ICD-9 250.00, 250.01). 03/13/16   Nishant Dhungel, MD  Insulin Syringe-Needle U-100 (INSULIN SYRINGE 1CC/28G) 28G X 1/2" 1 ML MISC 1 application by Does not apply route 2 (two) times daily. 03/14/16   Nishant Dhungel, MD  lisinopril (PRINIVIL,ZESTRIL) 10 MG tablet Take 1 tablet (10 mg total) by mouth daily. 03/17/16   AArgentina Donovan PA-C  metFORMIN (GLUCOPHAGE) 1000 MG tablet Take 1 tablet (1,000 mg total) by mouth 2 (two) times daily with a meal. 03/17/16   AArgentina Donovan PA-C     Objective:  EXAM:   Vitals:   03/17/16 1015  BP: (!) 143/91  Pulse: (!) 108  Resp: 20  Temp: 98.7 F (37.1 C)  TempSrc: Oral  SpO2: 97%  Weight: 285 lb (129.3 kg)  Height: _0  (1.778 m)    General appearance : A&OX3. NAD. Non-toxic-appearing HEENT: Atraumatic and Normocephalic.  PERRLA. EOM intact.  TM clear B. Mouth-MMM, post pharynx WNL w/o erythema, No PND. Neck: supple, no JVD. No cervical lymphadenopathy. No thyromegaly Chest/Lungs:  Breathing-non-labored, Good air entry bilaterally, breath sounds normal without rales, rhonchi, or wheezing  CVS: S1 S2 regular, no murmurs, gallops, rubs  Extremities: Bilateral Lower Ext shows no edema, both legs are warm to touch with = pulse throughout Neurology:  CN II-XII grossly intact, Non  focal.   Psych:  TP linear. J/I WNL. Normal speech. Appropriate eye contact and affect.  Skin:  No Rash  Data Review Lab Results  Component Value Date   HGBA1C 12.1 (H) 03/12/2016     Assessment & Plan   1. New onset type 2 diabetes mellitus (HCC) Uncontrolled - POCT glucose (manual entry).  No insulin given in office today as he had just taken his NPH about 30 mins before arrival. - Basic metabolic panel Increase dose- metFORMIN (GLUCOPHAGE) 1000 MG tablet; Take 1 tablet  (1,000 mg total) by mouth 2 (two) times daily with a meal.  Dispense: 180 tablet; Refill: 3 Check blood sugars 3 times daily and record and bring to next visit. Drink 100+ ounces of water daily.  2. AKI (acute kidney injury) ([place]) and hyponatremia(due to dehydration and hyperglycemia) - Basic metabolic panel  3. Essential hypertension - Basic metabolic panel start- lisinopril (PRINIVIL,ZESTRIL) 10 MG tablet; Take 1 tablet (10 mg total) by mouth daily.  Dispense: 90 tablet; Refill: 3     Patient have been counseled extensively about nutrition and exercise  Return in about 2 weeks (around 03/31/2016) for establish care with PCP and appt with Nicoletta Ba for Diabetes management.  The patient was given clear instructions to go to ER or return to medical center if symptoms don't improve, worsen or new problems develop. The patient verbalized understanding. The patient was told to call to get lab results if they haven't heard anything in the next week.     Freeman Caldron, PA-C [facility] [place], [place]   03/17/2016, 10:34 AMPatient ID: William Martin, male   DOB: 26-Jan-1981, 35 y.o.   MRN: [id number]

## 2016-03-29 MED FILL — !NOVOLIN 70/30 100 UNITS/ML: (70-30) 100 | 22 days supply | Qty: 10 | Fill #1

## 2016-03-31 ENCOUNTER — Ambulatory Visit: Payer: Self-pay | Admitting: Pharmacist

## 2016-04-06 ENCOUNTER — Telehealth: Payer: Self-pay

## 2016-04-06 NOTE — Telephone Encounter (Signed)
Contacted pt to go over lab results pt didn't answer lvm asking pt to give me a call back at his earliest convenience  

## 2016-11-12 ENCOUNTER — Encounter (HOSPITAL_COMMUNITY): Payer: Self-pay

## 2016-11-12 ENCOUNTER — Emergency Department (HOSPITAL_COMMUNITY)
Admission: EM | Admit: 2016-11-12 | Discharge: 2016-11-12 | Disposition: A | Discharge status: Home / Self Care | Payer: BLUE CROSS/BLUE SHIELD | Attending: Emergency Medicine | Admitting: Emergency Medicine

## 2016-11-12 DIAGNOSIS — E1110 Type 2 diabetes mellitus with ketoacidosis without coma: Secondary | ICD-10-CM | POA: Insufficient documentation

## 2016-11-12 DIAGNOSIS — F17200 Nicotine dependence, unspecified, uncomplicated: Secondary | ICD-10-CM | POA: Insufficient documentation

## 2016-11-12 DIAGNOSIS — Z79899 Other long term (current) drug therapy: Secondary | ICD-10-CM | POA: Insufficient documentation

## 2016-11-12 DIAGNOSIS — Z794 Long term (current) use of insulin: Secondary | ICD-10-CM | POA: Diagnosis not present

## 2016-11-12 DIAGNOSIS — R739 Hyperglycemia, unspecified: Secondary | ICD-10-CM

## 2016-11-12 DIAGNOSIS — E1165 Type 2 diabetes mellitus with hyperglycemia: Secondary | ICD-10-CM | POA: Insufficient documentation

## 2016-11-12 DIAGNOSIS — R350 Frequency of micturition: Secondary | ICD-10-CM | POA: Diagnosis present

## 2016-11-12 DIAGNOSIS — E119 Type 2 diabetes mellitus without complications: Secondary | ICD-10-CM

## 2016-11-12 HISTORY — DX: Type 2 diabetes mellitus without complications: E11.9

## 2016-11-12 LAB — CBG MONITORING, ED
GLUCOSE-CAPILLARY: 426 mg/dL — AB (ref 65–99)
Glucose-Capillary: 465 mg/dL — ABNORMAL HIGH (ref 65–99)
Glucose-Capillary: 344 mg/dL — ABNORMAL HIGH (ref 65–99)

## 2016-11-12 LAB — BASIC METABOLIC PANEL
Anion gap: 15 (ref 5–15)
BUN: 20 mg/dL (ref 6–20)
CALCIUM: 9.5 mg/dL (ref 8.9–10.3)
CO2: 20 mmol/L — ABNORMAL LOW (ref 22–32)
CREATININE: 1.28 mg/dL — AB (ref 0.61–1.24)
Chloride: 97 mmol/L — ABNORMAL LOW (ref 101–111)
GFR calc non Af Amer: >60 mL/min (ref >60)
Glucose, Bld: 472 mg/dL — ABNORMAL HIGH (ref 65–99)
Potassium: 4.7 mmol/L (ref 3.5–5.1)
Sodium: 132 mmol/L — ABNORMAL LOW (ref 135–145)

## 2016-11-12 LAB — CBC
HCT: 39.7 % (ref 39.0–52.0)
Hemoglobin: 14.1 g/dL (ref 13.0–17.0)
MCH: 27.8 pg (ref 26.0–34.0)
MCHC: 35.5 g/dL (ref 30.0–36.0)
MCV: 78.3 fL (ref 78.0–100.0)
PLATELETS: 235 10*3/uL (ref 150–400)
RBC: 5.07 MIL/uL (ref 4.22–5.81)
RDW: 14.8 % (ref 11.5–15.5)
WBC: 11.6 10*3/uL — AB (ref 4.0–10.5)

## 2016-11-12 LAB — URINALYSIS, ROUTINE W REFLEX MICROSCOPIC
Bacteria, UA: NONE SEEN
Bilirubin Urine: NEGATIVE
Ketones, ur: 20 mg/dL — AB
LEUKOCYTES UA: NEGATIVE
Nitrite: NEGATIVE
PH: 5 (ref 5.0–8.0)
PROTEIN: NEGATIVE mg/dL
Specific Gravity, Urine: 1.021 (ref 1.005–1.030)

## 2016-11-12 MED ORDER — SODIUM CHLORIDE 0.9 % IV BOLUS (SEPSIS)
1000.0000 mL | Freq: Once | INTRAVENOUS | Status: AC
  Administered 2016-11-12: 1000 mL via INTRAVENOUS

## 2016-11-12 MED ORDER — INSULIN ASPART 100 UNIT/ML ~~LOC~~ SOLN
10.0000 [IU] | Freq: Once | SUBCUTANEOUS | Status: AC
  Administered 2016-11-12: 10 [IU] via INTRAVENOUS
  Filled 2016-11-12: qty 1

## 2016-11-12 MED ORDER — INSULIN REGULAR HUMAN 100 UNIT/ML IJ SOLN
10.0000 [IU] | Freq: Once | INTRAMUSCULAR | Status: DC
  Filled 2016-11-12: qty 0.1

## 2016-11-12 MED ORDER — INSULIN ASPART 100 UNIT/ML IV SOLN
10.0000 [IU] | Freq: Once | INTRAVENOUS | Status: DC
  Filled 2016-11-12: qty 0.1

## 2016-11-12 MED ORDER — "INSULIN SYRINGE 28G X 1/2"" 1 ML MISC": 1.0000 "application " | Freq: Two times a day (BID) | 2 refills | Status: DC

## 2016-11-12 MED ORDER — METFORMIN HCL 1000 MG PO TABS: 1000.0000 mg | ORAL_TABLET | Freq: Two times a day (BID) | ORAL | 3 refills | Status: DC

## 2016-11-12 MED ORDER — INSULIN NPH ISOPHANE & REGULAR (70-30) 100 UNIT/ML ~~LOC~~ SUSP: 45.0000 [IU] | Freq: Two times a day (BID) | SUBCUTANEOUS | 1 refills | Status: DC

## 2016-11-12 NOTE — Discharge Instructions (Signed)
Resume taking your insulin as prescribed today.   Resume taking your metformin.   Keep a record of your blood sugars and follow-up with your primary Dr. To discuss the results.  Return to the emergency department if symptoms significantly worsen or change.

## 2016-11-12 NOTE — ED Triage Notes (Signed)
Patient c/o thirst x 1 week and yesterday his meter stated HIGH today it was 540 at 1500 today.

## 2016-11-12 NOTE — ED Notes (Signed)
Bed: WA18 Expected date:  Expected time:  Means of arrival:  Comments: Triage 2 

## 2016-11-12 NOTE — ED Provider Notes (Signed)
[place] DEPT Provider Note   CSN: [id number] Arrival date & time: 11/12/16  1618     History   Chief Complaint Chief Complaint  Patient presents with  . Hyperglycemia    HPI William Martin is a 36 y.o. male.  Patient is a 36 year old male with history of diabetes. He tells me he stopped taking his insulin in February because he felt well and did not feel as though he needed it. Over the past week, he has had increased thirst, increased urination, and his glucometer is reading "high". He denies any fevers or chills. He denies any chest pain or difficulty breathing.   The history is provided by the patient.  Hyperglycemia  Blood sugar level PTA:  High Severity:  Moderate Duration:  1 week Timing:  Constant Progression:  Worsening Chronicity:  New Current diabetic therapy:  Noncompliant with insulin therapy Context: noncompliance   Relieved by:  Nothing Ineffective treatments:  None tried Associated symptoms: increased thirst     Past Medical History:  Diagnosis Date  . Diabetes mellitus without complication ([place])   . GERD (gastroesophageal reflux disease)   . Tobacco abuse     Patient Active Problem List   Diagnosis Date Noted  . Obesity 03/14/2016  . Hypertriglyceridemia 03/14/2016  . Hyperlipidemia due to type 2 diabetes mellitus ([place]) 03/14/2016  . Hypokalemia 03/14/2016  . Diabetic ketoacidosis without coma associated with type 2 diabetes mellitus ([place])   . DKA (diabetic ketoacidoses) ([place]) 03/11/2016  . New onset type 2 diabetes mellitus ([place]) 03/11/2016  . Tobacco abuse 03/11/2016  . GERD (gastroesophageal reflux disease) 03/11/2016  . AKI (acute kidney injury) ([place])     History reviewed. No pertinent surgical history.     Home Medications    Prior to Admission medications   Medication Sig Start Date End Date Taking? Authorizing Provider  Alcohol Swabs (ALCOHOL PADS) 70 % PADS 1 application by Does not apply route 4 (four) times daily.  03/14/16   Dhungel, Flonnie Overman, MD  blood glucose meter kit and supplies KIT Dispense based on patient and insurance preference. Use up to four times daily as directed. (FOR ICD-9 250.00, 250.01). 03/13/16   Dhungel, Nishant, MD  insulin NPH-regular Human (NOVOLIN 70/30) (70-30) 100 UNIT/ML injection Inject 45 Units into the skin 2 (two) times daily with a meal. 03/14/16   Dhungel, Nishant, MD  Insulin Syringe-Needle U-100 (INSULIN SYRINGE 1CC/28G) 28G X 1/2" 1 ML MISC 1 application by Does not apply route 2 (two) times daily. 03/14/16   Dhungel, Nishant, MD  lisinopril (PRINIVIL,ZESTRIL) 10 MG tablet Take 1 tablet (10 mg total) by mouth daily. 03/17/16   Argentina Donovan, PA-C  lovastatin (MEVACOR) 40 MG tablet Take 1 tablet (40 mg total) by mouth at bedtime. 03/14/16   Dhungel, Flonnie Overman, MD  metFORMIN (GLUCOPHAGE) 1000 MG tablet Take 1 tablet (1,000 mg total) by mouth 2 (two) times daily with a meal. 03/17/16   McClung, Dionne Bucy, PA-C  omeprazole (PRILOSEC OTC) 20 MG tablet Take 20 mg by mouth daily as needed (reflux/heartburn.).    [provider]    Family History Family History  Problem Relation Age of Onset  . Diabetes type II Mother     Social History Social History  Substance Use Topics  . Smoking status: Current Some Day Smoker  . Smokeless tobacco: Never Used  . Alcohol use Yes     Comment: occasional     Allergies   Bee venom   Review of Systems Review of  Systems  Endocrine: Positive for polydipsia.  All other systems reviewed and are negative.    Physical Exam Updated Vital Signs BP (!) 140/104 (BP Location: Right Arm)   Pulse (!) 110   Temp 97.8 F (36.6 C) (Oral)   Resp 12   Ht 5' 10"  (1.778 m)   Wt 122.5 kg (270 lb)   SpO2 93%   BMI 38.74 kg/m   Physical Exam  Constitutional: He is oriented to person, place, and time. He appears well-developed and well-nourished. No distress.  HENT:  Head: Normocephalic and atraumatic.  Mouth/Throat:  Oropharynx is clear and moist.  Neck: Normal range of motion. Neck supple.  Cardiovascular: Normal rate and regular rhythm.  Exam reveals no friction rub.   No murmur heard. Pulmonary/Chest: Effort normal and breath sounds normal. No respiratory distress. He has no wheezes. He has no rales.  Abdominal: Soft. Bowel sounds are normal. He exhibits no distension. There is no tenderness.  Musculoskeletal: Normal range of motion. He exhibits no edema.  Neurological: He is alert and oriented to person, place, and time. Coordination normal.  Skin: Skin is warm and dry. He is not diaphoretic.  Nursing note and vitals reviewed.    ED Treatments / Results  Labs (all labs ordered are listed, but only abnormal results are displayed) Labs Reviewed  CBG MONITORING, ED - Abnormal; Notable for the following:       Result Value   Glucose-Capillary 426 (*)    All other components within normal limits  BASIC METABOLIC PANEL  CBC  URINALYSIS, ROUTINE W REFLEX MICROSCOPIC  CBG MONITORING, ED    EKG  EKG Interpretation None       Radiology No results found.  Procedures Procedures (including critical care time)  Medications Ordered in ED Medications  sodium chloride 0.9 % bolus 1,000 mL (not administered)  insulin regular bolus via infusion 10 Units (not administered)     Initial Impression / Assessment and Plan / ED Course  I have reviewed the triage vital signs and the nursing notes.  Pertinent labs & imaging results that were available during my care of the patient were reviewed by me and considered in my medical decision making (see chart for details).  Patient with hyperglycemia, however no ketoacidosis. Sugars have improved with IV fluids and insulin. He will be discharged with his home medications and follow up with his primary doctor.  Final Clinical Impressions(s) / ED Diagnoses   Final diagnoses:  None    New Prescriptions New Prescriptions   No medications on file      Veryl Speak, MD 11/12/16 2349

## 2017-01-20 ENCOUNTER — Encounter (HOSPITAL_COMMUNITY): Payer: Self-pay | Admitting: *Deleted

## 2017-01-20 ENCOUNTER — Ambulatory Visit (HOSPITAL_COMMUNITY)
Admission: EM | Admit: 2017-01-20 | Discharge: 2017-01-20 | Disposition: A | Discharge status: Home / Self Care | Payer: BLUE CROSS/BLUE SHIELD | Attending: Emergency Medicine | Admitting: Emergency Medicine

## 2017-01-20 DIAGNOSIS — E119 Type 2 diabetes mellitus without complications: Secondary | ICD-10-CM

## 2017-01-20 DIAGNOSIS — Z794 Long term (current) use of insulin: Secondary | ICD-10-CM

## 2017-01-20 DIAGNOSIS — Z760 Encounter for issue of repeat prescription: Secondary | ICD-10-CM | POA: Diagnosis not present

## 2017-01-20 LAB — POCT I-STAT, CHEM 8
BUN: 13 mg/dL (ref 6–20)
CALCIUM ION: 1.16 mmol/L (ref 1.15–1.40)
CREATININE: 1.1 mg/dL (ref 0.61–1.24)
Chloride: 104 mmol/L (ref 101–111)
Glucose, Bld: 129 mg/dL — ABNORMAL HIGH (ref 65–99)
HEMATOCRIT: 41 % (ref 39.0–52.0)
HEMOGLOBIN: 13.9 g/dL (ref 13.0–17.0)
Potassium: 3.7 mmol/L (ref 3.5–5.1)
SODIUM: 141 mmol/L (ref 135–145)
TCO2: 26 mmol/L (ref 22–32)

## 2017-01-20 MED ORDER — INSULIN NPH ISOPHANE & REGULAR (70-30) 100 UNIT/ML ~~LOC~~ SUSP: 45.0000 [IU] | Freq: Two times a day (BID) | SUBCUTANEOUS | 0 refills | Status: DC

## 2017-01-20 MED ORDER — BLOOD GLUCOSE MONITOR KIT: PACK | 0 refills | Status: DC

## 2017-01-20 MED FILL — LISINOPRIL 10 MG TABS: 10 | 30 days supply | Qty: 30 | Fill #1

## 2017-01-20 NOTE — ED Provider Notes (Signed)
HPI  SUBJECTIVE:  William Martin is a 36 y.o. male who presents for medication refill.  States that he ran on his Novolin 45 units twice daily over 2 weeks ago.  He has 2 refills on his lisinopril till December 2018 and has metformin with refills until 2019.  States that he is taking the metformin, but has not taken lisinopril since February.  He states that he has not been checking his sugars over the past 2 weeks.  He denies polyuria, polydipsia, weight loss.  He has a past medical history of diabetes, DKA, hypertension, acute kidney injury secondary to DKA.  He states that he does not have any symptoms of DKA.  He has been otherwise well over the past 2 weeks.  PMD: None.    Past Medical History:  Diagnosis Date  . Diabetes mellitus without complication ([place])   . GERD (gastroesophageal reflux disease)   . Tobacco abuse     History reviewed. No pertinent surgical history.  Family History  Problem Relation Age of Onset  . Diabetes type II Mother     Social History  Substance Use Topics  . Smoking status: Current Some Day Smoker  . Smokeless tobacco: Never Used  . Alcohol use Yes     Comment: occasional    No current facility-administered medications for this encounter.   Current Outpatient Prescriptions:  .  Alcohol Swabs (ALCOHOL PADS) 70 % PADS, 1 application by Does not apply route 4 (four) times daily. (Patient not taking: Reported on 11/12/2016), Disp: 100 each, Rfl: 3 .  blood glucose meter kit and supplies KIT, Dispense based on patient and insurance preference. Use up to four times daily as directed. (FOR ICD-9 250.00, 250.01)., Disp: 1 each, Rfl: 0 .  insulin NPH-regular Human (NOVOLIN 70/30) (70-30) 100 UNIT/ML injection, Inject 45 Units into the skin 2 (two) times daily with a meal., Disp: 10 mL, Rfl: 0 .  Insulin Syringe-Needle U-100 (INSULIN SYRINGE 1CC/28G) 28G X 1/2" 1 ML MISC, 1 application by Does not apply route 2 (two) times daily., Disp: 100 each, Rfl: 2 .   lisinopril (PRINIVIL,ZESTRIL) 10 MG tablet, Take 1 tablet (10 mg total) by mouth daily. (Patient not taking: Reported on 11/12/2016), Disp: 90 tablet, Rfl: 3 .  lovastatin (MEVACOR) 40 MG tablet, Take 1 tablet (40 mg total) by mouth at bedtime. (Patient not taking: Reported on 11/12/2016), Disp: 30 tablet, Rfl: 0 .  metFORMIN (GLUCOPHAGE) 1000 MG tablet, Take 1 tablet (1,000 mg total) by mouth 2 (two) times daily with a meal., Disp: 180 tablet, Rfl: 3 .  omeprazole (PRILOSEC OTC) 20 MG tablet, Take 20 mg by mouth daily as needed (reflux/heartburn.)., Disp: , Rfl:   Allergies  Allergen Reactions  . Bee Venom Swelling     ROS  As noted in HPI.   Physical Exam  BP (!) 143/94 (BP Location: Right Arm)   Pulse 68   Temp 98.3 F (36.8 C) (Oral)   Resp 17   SpO2 98%   Constitutional: Well developed, well nourished, no acute distress Eyes:  EOMI, conjunctiva normal bilaterally HENT: Normocephalic, atraumatic,mucus membranes moist Respiratory: Normal inspiratory effort Cardiovascular: Normal rate GI: nondistended skin: No rash, skin intact Musculoskeletal: no deformities Neurologic: Alert & oriented x 3, no focal neuro deficits Psychiatric: Speech and behavior appropriate   ED Course   Medications - No data to display  Orders Placed This Encounter  Procedures  . I-STAT, chem 8    Standing Status:   Standing  Number of Occurrences:   1    Results for orders placed or performed during the hospital encounter of 01/20/17 (from the past 24 hour(s))  I-STAT, chem 8     Status: Abnormal   Collection Time: 01/20/17 11:56 AM  Result Value Ref Range   Sodium 141 135 - 145 mmol/L   Potassium 3.7 3.5 - 5.1 mmol/L   Chloride 104 101 - 111 mmol/L   BUN 13 6 - 20 mg/dL   Creatinine, Ser 1.10 0.61 - 1.24 mg/dL   Glucose, Bld 129 (H) 65 - 99 mg/dL   Calcium, Ion 1.16 1.15 - 1.40 mmol/L   TCO2 26 22 - 32 mmol/L   Hemoglobin 13.9 13.0 - 17.0 g/dL   HCT 41.0 39.0 - 52.0 %   No  results found.  ED Clinical Impression  Medication refill  Type 2 diabetes mellitus without complication, with long-term current use of insulin [facility])   ED Assessment/Plan  Checking i-STAT to evaluate for DKA since he has not been on his insulin in over 2 weeks.  If acceptable, will send home with Novolin 45 units twice daily and more glucometer strips, primary care referral.  I-STAT acceptable.  Normal kidneys, electrolytes, glucose 129.  Emphasized importance of taking blood pressure and diabetes medications regularly.   Discussed with him that he may not need the insulin at this point in time because the metformin seems to be controlling his glucose fairly adequately.  Discussed with him that I am happy to refill his insulin and his glucometer/glucometer strips, but to not start using it until he talks with a primary care physician.  Discussed with him that he may need a lower dose than his previous regimen.    Discussed labs, plan and followup with patient.patient agrees with plan.   Meds ordered this encounter  Medications  . blood glucose meter kit and supplies KIT    Sig: Dispense based on patient and insurance preference. Use up to four times daily as directed. (FOR ICD-9 250.00, 250.01).    Dispense:  1 each    Refill:  0    Order Specific Question:   Number of strips    Answer:   100    Order Specific Question:   Number of lancets    Answer:   100  . insulin NPH-regular Human (NOVOLIN 70/30) (70-30) 100 UNIT/ML injection    Sig: Inject 45 Units into the skin 2 (two) times daily with a meal.    Dispense:  10 mL    Refill:  0    *This clinic note was created using [employer]. Therefore, there may be occasional mistakes despite careful proofreading.   ?    Melynda Ripple, MD 01/20/17 1225

## 2017-01-20 NOTE — ED Triage Notes (Addendum)
Patient is here today for medication refills. Novolin and metformin. Patient has been without novolin for 2 weeks.   Patient does not have insurance. Patient needs community health and wellness information.

## 2017-01-20 NOTE — Discharge Instructions (Signed)
Do not start the insulin until you talk with a primary care physician.  You may not need it after all.  Definitely restart her blood pressure medicines and continue the metformin.  Below is a list of primary care practices who are taking new patients for you to follow-up with. [facility] [address] [place], [place] [address] [phone]  [facility] [phone] [address] [place] [address]  [facility]: [address] [place] [id number]  [phone]  [facility]: [address] [place] [id number]   [phone]  [facility]: [address] [place] [address]  [phone]   primary care : [address] [place] [address] [phone]  [facility]: [address] [place] [phone]  [facility]: [address] [place] [id number] [phone]  Dr. Oneal GroutMahima Pandey [address] [facility] [place] [id number]  [phone]  Dr. Jackie PlumGeorge Osei-Bonsu, [facility]. [address] [place], [place] [address]  [phone]  Go to www.goodrx.com to look up your medications. This will give you a list of where you can find your prescriptions at the most affordable prices. Or ask the pharmacist what the cash price is, or if they have any other discount programs available to help make your medication more affordable. This can be less expensive than what you would pay with insurance.

## 2017-01-27 ENCOUNTER — Encounter: Payer: Self-pay | Admitting: Internal Medicine

## 2017-01-27 ENCOUNTER — Ambulatory Visit: Payer: BLUE CROSS/BLUE SHIELD | Attending: Internal Medicine | Admitting: Internal Medicine

## 2017-01-27 VITALS — BP 146/84 | HR 81 | Temp 99.1°F | Resp 16 | Wt <= 1120 oz

## 2017-01-27 DIAGNOSIS — Z87891 Personal history of nicotine dependence: Secondary | ICD-10-CM | POA: Diagnosis not present

## 2017-01-27 DIAGNOSIS — Z794 Long term (current) use of insulin: Secondary | ICD-10-CM | POA: Diagnosis not present

## 2017-01-27 DIAGNOSIS — E6609 Other obesity due to excess calories: Secondary | ICD-10-CM | POA: Insufficient documentation

## 2017-01-27 DIAGNOSIS — Z79899 Other long term (current) drug therapy: Secondary | ICD-10-CM | POA: Insufficient documentation

## 2017-01-27 DIAGNOSIS — Z23 Encounter for immunization: Secondary | ICD-10-CM | POA: Insufficient documentation

## 2017-01-27 DIAGNOSIS — Z6838 Body mass index (BMI) 38.0-38.9, adult: Secondary | ICD-10-CM | POA: Diagnosis not present

## 2017-01-27 DIAGNOSIS — I10 Essential (primary) hypertension: Secondary | ICD-10-CM | POA: Insufficient documentation

## 2017-01-27 DIAGNOSIS — E119 Type 2 diabetes mellitus without complications: Secondary | ICD-10-CM | POA: Diagnosis not present

## 2017-01-27 DIAGNOSIS — E785 Hyperlipidemia, unspecified: Secondary | ICD-10-CM | POA: Diagnosis not present

## 2017-01-27 LAB — GLUCOSE, POCT (MANUAL RESULT ENTRY): POC Glucose: 125 mg/dl — AB (ref 70–99)

## 2017-01-27 LAB — POCT GLYCOSYLATED HEMOGLOBIN (HGB A1C): HEMOGLOBIN A1C: 5.9

## 2017-01-27 MED ORDER — METFORMIN HCL 1000 MG PO TABS: 1000.0000 mg | ORAL_TABLET | Freq: Two times a day (BID) | ORAL | 3 refills | Status: DC

## 2017-01-27 MED ORDER — INSULIN NPH ISOPHANE & REGULAR (70-30) 100 UNIT/ML ~~LOC~~ SUSP: 15.0000 [IU] | Freq: Every day | SUBCUTANEOUS | 6 refills | Status: DC

## 2017-01-27 MED ORDER — LOVASTATIN 40 MG PO TABS: 40.0000 mg | ORAL_TABLET | Freq: Every day | ORAL | 6 refills | Status: DC

## 2017-01-27 MED ORDER — LISINOPRIL 10 MG PO TABS: 10.0000 mg | ORAL_TABLET | Freq: Every day | ORAL | 3 refills | Status: DC

## 2017-01-27 NOTE — Progress Notes (Signed)
Patient ID: William Martin, male    DOB: [birth year]  MRN: [id number]  CC: re-establish and Diabetes   Subjective: William Martin is a 36 y.o. male who presents for chronic disease management. Last seen here 02/2016 His concerns today include:  Patient with history of DM, HTN, HL, tobacco dependence.  DM:  Seen in UC recently after being out of insulin x 4 wks. Prior to this, he was on Novolin 70/30 45 units BID and Metformin 1 gram BID -not checking BS regularly. Not sure if he is able to feel when BS are low because he has never had it happened before  -Eating habits: "not as well as I should." Over eats and not eating fresh fruits and veggies. Working partime and eats what he can Exercise: .needs tto work on this.  Med: taking Metformin once a day and insulin only with dinner since being seen at [facility] as BS was good.  -no blurred vision. Last eye exam over 1 yr ago  HTN: compliant with Lisinopril. Limits salt No CP/SOB/LE  HL: Mevcor rxn ran out.   Tobacco dep: "I haven't smoked in a while. Smokes cigars. Last smoke 3 mths ago.  Patient Active Problem List   Diagnosis Date Noted  . Immunization due 01/27/2017  . Obesity 03/14/2016  . Hypertriglyceridemia 03/14/2016  . Hyperlipidemia due to type 2 diabetes mellitus ([place]) 03/14/2016  . Hypokalemia 03/14/2016  . Diabetic ketoacidosis without coma associated with type 2 diabetes mellitus ([place])   . DKA (diabetic ketoacidoses) ([place]) 03/11/2016  . New onset type 2 diabetes mellitus ([place]) 03/11/2016  . Tobacco abuse 03/11/2016  . GERD (gastroesophageal reflux disease) 03/11/2016  . AKI (acute kidney injury) [facility])      Current Outpatient Prescriptions on File Prior to Visit  Medication Sig Dispense Refill  . Alcohol Swabs (ALCOHOL PADS) 70 % PADS 1 application by Does not apply route 4 (four) times daily. (Patient not taking: Reported on 11/12/2016) 100 each 3  . blood glucose meter kit and supplies KIT Dispense based on patient and  insurance preference. Use up to four times daily as directed. (FOR ICD-9 250.00, 250.01). 1 each 0  . Insulin Syringe-Needle U-100 (INSULIN SYRINGE 1CC/28G) 28G X 1/2" 1 ML MISC 1 application by Does not apply route 2 (two) times daily. 100 each 2  . omeprazole (PRILOSEC OTC) 20 MG tablet Take 20 mg by mouth daily as needed (reflux/heartburn.).     No current facility-administered medications on file prior to visit.     Allergies  Allergen Reactions  . Bee Venom Swelling    Social History   Social History  . Marital status: Single    Spouse name: N/A  . Number of children: N/A  . Years of education: N/A   Occupational History  . Not on file.   Social History Main Topics  . Smoking status: Current Some Day Smoker  . Smokeless tobacco: Never Used  . Alcohol use Yes     Comment: occasional  . Drug use: No  . Sexual activity: Not on file   Other Topics Concern  . Not on file   Social History Narrative  . No narrative on file    Family History  Problem Relation Age of Onset  . Diabetes type II Mother     No past surgical history on file.  ROS: Review of Systems  Skin:       No numbness in feet. No ulcers    PHYSICAL EXAM: BP Marland Kitchen)  146/84   Pulse 81   Temp 99.1 F (37.3 C) (Oral)   Resp 16   Wt 28 lb 12.8 oz (13.1 kg)   SpO2 96%   BMI 4.13 kg/m   Physical Exam  General appearance - alert, well appearing, and in no distress Mental status - alert, oriented to person, place, and time, normal mood, behavior, speech, dress, motor activity, and thought processes Neck - supple, no significant adenopathy Chest - clear to auscultation, no wheezes, rales or rhonchi, symmetric air entry Heart - normal rate, regular rhythm, normal S1, S2, no murmurs, rubs, clicks or gallops Extremities - peripheral pulses normal, no pedal edema, no clubbing or cyanosis  Results for orders placed or performed in visit on 01/27/17  POCT glucose (manual entry)  Result Value Ref Range     POC Glucose 125 (A) 70 - 99 mg/dl  POCT glycosylated hemoglobin (Hb A1C)  Result Value Ref Range   Hemoglobin A1C 5.9     ASSESSMENT AND PLAN: 1.type 2 diabetes mellitus controlled ([place]) - commended him on DM being controlled. Counseled extensively on healthy eating habits and regular exericse -went over symptoms of hypoglycemia Advise to resume Metformin at 1 gram BID and dec insulin to 15 units daily.  -advise to get eye exam done - POCT glucose (manual entry) - POCT glycosylated hemoglobin (Hb A1C) - insulin NPH-regular Human (NOVOLIN 70/30) (70-30) 100 UNIT/ML injection; Inject 15 Units into the skin daily with supper.  Dispense: 10 mL; Refill: 6 - metFORMIN (GLUCOPHAGE) 1000 MG tablet; Take 1 tablet (1,000 mg total) by mouth 2 (two) times daily with a meal.  Dispense: 180 tablet; Refill: 3 - Comprehensive metabolic panel - Lipid panel  2. Essential hypertension -not at goal. Did not take med as yet for today - lisinopril (PRINIVIL,ZESTRIL) 10 MG tablet; Take 1 tablet (10 mg total) by mouth daily.  Dispense: 90 tablet; Refill: 3  3. Hyperlipidemia, unspecified hyperlipidemia type - lovastatin (MEVACOR) 40 MG tablet; Take 1 tablet (40 mg total) by mouth at bedtime.  Dispense: 30 tablet; Refill: 6  4. Class 2 obesity due to excess calories without serious comorbidity with body mass index (BMI) of 38.0 to 38.9 in adult See #1 above  5. Immunization due - Flu Vaccine QUAD 6+ mos PF IM (Fluarix Quad PF)  6. Former cigar smoker -commended him on quitting and encouraged him to remain free of tobacco.   Patient was given the opportunity to ask questions.  Patient verbalized understanding of the plan and was able to repeat key elements of the plan.   Orders Placed This Encounter  Procedures  . Flu Vaccine QUAD 6+ mos PF IM (Fluarix Quad PF)  . Tdap vaccine greater than or equal to 7yo IM  . Comprehensive metabolic panel  . Lipid panel  . POCT glucose (manual entry)  . POCT  glycosylated hemoglobin (Hb A1C)     Requested Prescriptions   Signed Prescriptions Disp Refills  . insulin NPH-regular Human (NOVOLIN 70/30) (70-30) 100 UNIT/ML injection 10 mL 6    Sig: Inject 15 Units into the skin daily with supper.  . lovastatin (MEVACOR) 40 MG tablet 30 tablet 6    Sig: Take 1 tablet (40 mg total) by mouth at bedtime.  Marland Kitchen lisinopril (PRINIVIL,ZESTRIL) 10 MG tablet 90 tablet 3    Sig: Take 1 tablet (10 mg total) by mouth daily.  . metFORMIN (GLUCOPHAGE) 1000 MG tablet 180 tablet 3    Sig: Take 1 tablet (1,000 mg total)  by mouth 2 (two) times daily with a meal.    Return in about 3 months (around 04/29/2017).  Karle Plumber, MD, FACP

## 2017-01-27 NOTE — Patient Instructions (Addendum)
Continue metformin 1000 mg twice a day.  Decrease insulin to 15 units with supper.  Try to check your blood sugars at least twice a day for the next 2-3 weeks with these medication changes.   Continue to work on eating habits and trying to be more active as discussed.  I have enclosed some information about healthy eating habits.  Td Vaccine (Tetanus and Diphtheria): What You Need to Know 1. Why get vaccinated? Tetanus  and diphtheria are very serious diseases. They are rare in the [place] today, but people who do become infected often have severe complications. Td vaccine is used to protect adolescents and adults from both of these diseases. Both tetanus and diphtheria are infections caused by bacteria. Diphtheria spreads from person to person through coughing or sneezing. Tetanus-causing bacteria enter the body through cuts, scratches, or wounds. TETANUS (lockjaw) causes painful muscle tightening and stiffness, usually all over the body.  It can lead to tightening of muscles in the head and neck so you can't open your mouth, swallow, or sometimes even breathe. Tetanus kills about 1 out of every 10 people who are infected even after receiving the best medical care.  DIPHTHERIA can cause a thick coating to form in the back of the throat.  It can lead to breathing problems, paralysis, heart failure, and death.  Before vaccines, as many as 200,000 cases of diphtheria and hundreds of cases of tetanus were reported in the [place] each year. Since vaccination began, reports of cases for both diseases have dropped by about 99%. 2. Td vaccine Td vaccine can protect adolescents and adults from tetanus and diphtheria. Td is usually given as a booster dose every 10 years but it can also be given earlier after a severe and dirty wound or burn. Another vaccine, called Tdap, which protects against pertussis in addition to tetanus and diphtheria, is sometimes recommended instead of Td vaccine. Your  doctor or the person giving you the vaccine can give you more information. Td may safely be given at the same time as other vaccines. 3. Some people should not get this vaccine  A person who has ever had a life-threatening allergic reaction after a previous dose of any tetanus or diphtheria containing vaccine, OR has a severe allergy to any part of this vaccine, should not get Td vaccine. Tell the person giving the vaccine about any severe allergies.  Talk to your doctor if you: ? had severe pain or swelling after any vaccine containing diphtheria or tetanus, ? ever had a condition called Guillain Barre Syndrome (GBS), ? aren't feeling well on the day the shot is scheduled. 4. What are the risks from Td vaccine? With any medicine, including vaccines, there is a chance of side effects. These are usually mild and go away on their own. Serious reactions are also possible but are rare. Most people who get Td vaccine do not have any problems with it. Mild problems following Td vaccine: (Did not interfere with activities)  Pain where the shot was given (about 8 people in 10)  Redness or swelling where the shot was given (about 1 person in 4)  Mild fever (rare)  Headache (about 1 person in 4)  Tiredness (about 1 person in 4)  Moderate problems following Td vaccine: (Interfered with activities, but did not require medical attention)  Fever over 102F (rare)  Severe problems following Td vaccine: (Unable to perform usual activities; required medical attention)  Swelling, severe pain, bleeding and/or redness in the  arm where the shot was given (rare).  Problems that could happen after any vaccine:  People sometimes faint after a medical procedure, including vaccination. Sitting or lying down for about 15 minutes can help prevent fainting, and injuries caused by a fall. Tell your doctor if you feel dizzy, or have vision changes or ringing in the ears.  Some people get severe pain in the  shoulder and have difficulty moving the arm where a shot was given. This happens very rarely.  Any medication can cause a severe allergic reaction. Such reactions from a vaccine are very rare, estimated at fewer than 1 in a million doses, and would happen within a few minutes to a few hours after the vaccination. As with any medicine, there is a very remote chance of a vaccine causing a serious injury or death. The safety of vaccines is always being monitored. For more information, visit: http://floyd.org/ 5. What if there is a serious reaction? What should I look for? Look for anything that concerns you, such as signs of a severe allergic reaction, very high fever, or unusual behavior. Signs of a severe allergic reaction can include hives, swelling of the face and throat, difficulty breathing, a fast heartbeat, dizziness, and weakness. These would usually start a few minutes to a few hours after the vaccination. What should I do?  If you think it is a severe allergic reaction or other emergency that can't wait, call 9-1-1 or get the person to the nearest hospital. Otherwise, call your doctor.  Afterward, the reaction should be reported to the Vaccine Adverse Event Reporting System (VAERS). Your doctor might file this report, or you can do it yourself through the VAERS web site at www.vaers.LAgents.no, or by calling 1-586-218-0490. ? VAERS does not give medical advice. 6. The National Vaccine Injury Compensation Program The [employer] Vaccine Injury Compensation Program (VICP) is a federal program that was created to compensate people who may have been injured by certain vaccines. Persons who believe they may have been injured by a vaccine can learn about the program and about filing a claim by calling 1-250-359-4997 or visiting the VICP website at SpiritualWord.at. There is a time limit to file a claim for compensation. 7. How can I learn more?  Ask your doctor. He or she can  give you the vaccine package insert or suggest other sources of information.  Call your local or state health department.  Contact the Centers for Disease Control and Prevention (CDC): ? Call 817-089-5504 (1-800-CDC-INFO) ? Visit CDC's website at PicCapture.uy CDC Td Vaccine VIS (07/07/15) This information is not intended to replace advice given to you by your health care provider. Make sure you discuss any questions you have with your health care provider. Document Released: 01/09/2006 Document Revised: 12/03/2015 Document Reviewed: 12/03/2015 Elsevier Interactive Patient Education  2017 Elsevier Inc.  Influenza Virus Vaccine injection (Fluarix) What is this medicine? INFLUENZA VIRUS VACCINE (in floo EN zuh VAHY ruhs vak SEEN) helps to reduce the risk of getting influenza also known as the flu. This medicine may be used for other purposes; ask your health care provider or pharmacist if you have questions. COMMON BRAND NAME(S): Fluarix, Fluzone What should I tell my health care provider before I take this medicine? They need to know if you have any of these conditions: -bleeding disorder like hemophilia -fever or infection -Guillain-Barre syndrome or other neurological problems -immune system problems -infection with the human immunodeficiency virus (HIV) or AIDS -low blood platelet counts -multiple sclerosis -an unusual  or allergic reaction to influenza virus vaccine, eggs, chicken proteins, latex, gentamicin, other medicines, foods, dyes or preservatives -pregnant or trying to get pregnant -breast-feeding How should I use this medicine? This vaccine is for injection into a muscle. It is given by a health care professional. A copy of Vaccine Information Statements will be given before each vaccination. Read this sheet carefully each time. The sheet may change frequently. Talk to your pediatrician regarding the use of this medicine in children. Special care may be  needed. Overdosage: If you think you have taken too much of this medicine contact a poison control center or emergency room at once. NOTE: This medicine is only for you. Do not share this medicine with others. What if I miss a dose? This does not apply. What may interact with this medicine? -chemotherapy or radiation therapy -medicines that lower your immune system like etanercept, anakinra, infliximab, and adalimumab -medicines that treat or prevent blood clots like warfarin -phenytoin -steroid medicines like prednisone or cortisone -theophylline -vaccines This list may not describe all possible interactions. Give your health care provider a list of all the medicines, herbs, non-prescription drugs, or dietary supplements you use. Also tell them if you smoke, drink alcohol, or use illegal drugs. Some items may interact with your medicine. What should I watch for while using this medicine? Report any side effects that do not go away within 3 days to your doctor or health care professional. Call your health care provider if any unusual symptoms occur within 6 weeks of receiving this vaccine. You may still catch the flu, but the illness is not usually as bad. You cannot get the flu from the vaccine. The vaccine will not protect against colds or other illnesses that may cause fever. The vaccine is needed every year. What side effects may I notice from receiving this medicine? Side effects that you should report to your doctor or health care professional as soon as possible: -allergic reactions like skin rash, itching or hives, swelling of the face, lips, or tongue Side effects that usually do not require medical attention (report to your doctor or health care professional if they continue or are bothersome): -fever -headache -muscle aches and pains -pain, tenderness, redness, or swelling at site where injected -weak or tired This list may not describe all possible side effects. Call your doctor  for medical advice about side effects. You may report side effects to FDA at 1-800-FDA-1088. Where should I keep my medicine? This vaccine is only given in a clinic, pharmacy, doctor's office, or other health care setting and will not be stored at home. NOTE: This sheet is a summary. It may not cover all possible information. If you have questions about this medicine, talk to your doctor, pharmacist, or health care provider.  2018 Elsevier/Gold Standard (2007-10-10 09:30:40)

## 2017-01-28 LAB — COMPREHENSIVE METABOLIC PANEL
A/G RATIO: 1.6 (ref 1.2–2.2)
ALBUMIN: 4.7 g/dL (ref 3.5–5.5)
ALK PHOS: 64 IU/L (ref 39–117)
ALT: 34 IU/L (ref 0–44)
AST: 32 IU/L (ref 0–40)
BILIRUBIN TOTAL: 1.1 mg/dL (ref 0.0–1.2)
BUN / CREAT RATIO: 11 (ref 9–20)
BUN: 12 mg/dL (ref 6–20)
CHLORIDE: 101 mmol/L (ref 96–106)
CO2: 22 mmol/L (ref 20–29)
Calcium: 9.2 mg/dL (ref 8.7–10.2)
Creatinine, Ser: 1.08 mg/dL (ref 0.76–1.27)
GFR calc non Af Amer: 88 mL/min/{1.73_m2} (ref >59)
GFR, EST AFRICAN AMERICAN: 102 mL/min/{1.73_m2} (ref >59)
GLOBULIN, TOTAL: 3 g/dL (ref 1.5–4.5)
Glucose: 105 mg/dL — ABNORMAL HIGH (ref 65–99)
POTASSIUM: 4 mmol/L (ref 3.5–5.2)
SODIUM: 141 mmol/L (ref 134–144)
TOTAL PROTEIN: 7.7 g/dL (ref 6.0–8.5)

## 2017-01-28 LAB — LIPID PANEL
CHOLESTEROL TOTAL: 245 mg/dL — AB (ref 100–199)
Chol/HDL Ratio: 7.4 ratio — ABNORMAL HIGH (ref 0.0–5.0)
HDL: 33 mg/dL — ABNORMAL LOW (ref >39)
LDL Calculated: 173 mg/dL — ABNORMAL HIGH (ref 0–99)
Triglycerides: 196 mg/dL — ABNORMAL HIGH (ref 0–149)
VLDL CHOLESTEROL CAL: 39 mg/dL (ref 5–40)

## 2017-02-01 ENCOUNTER — Telehealth: Payer: Self-pay

## 2017-02-01 NOTE — Telephone Encounter (Signed)
Contacted pt went over lab results. Pt is aware of results and doesn't have any questions or concerns

## 2017-06-01 IMAGING — US US ABDOMEN LIMITED
1 series · 14 of 25 positions shown · non-contrast
Comparison: None.

CLINICAL DATA: Abdominal pain, elevated bilirubin

EXAM:
US ABDOMEN LIMITED - RIGHT UPPER QUADRANT

[Series 1: us abdomen limited · 0.24mm/px · 14 of 46 slices shown]
[im 1/46]
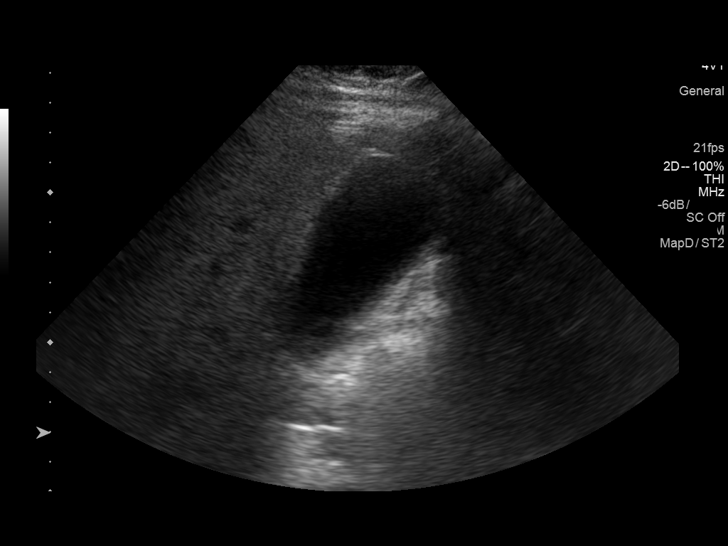
[im 4/46]
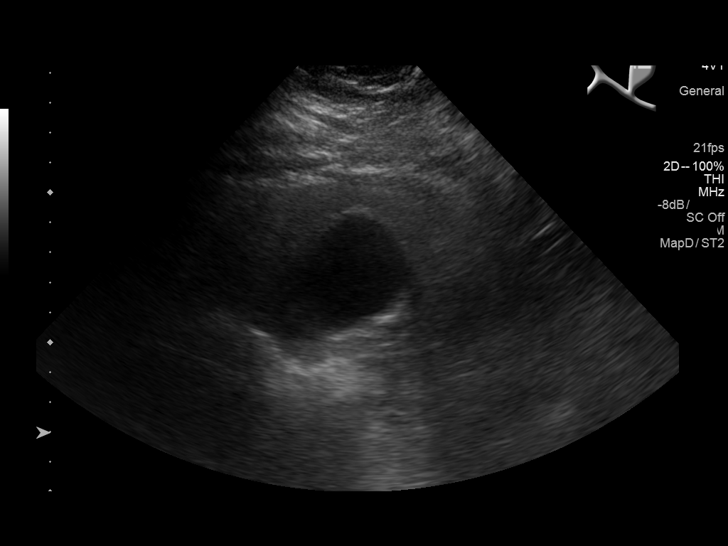
[im 8/46]
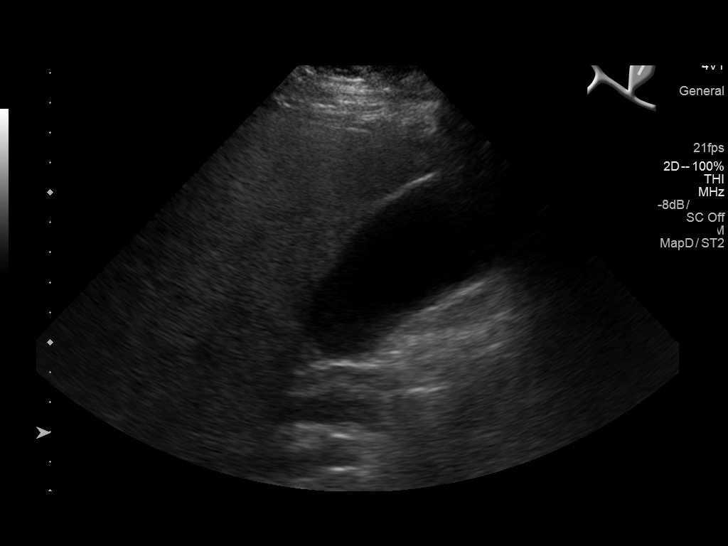
[im 12/46]
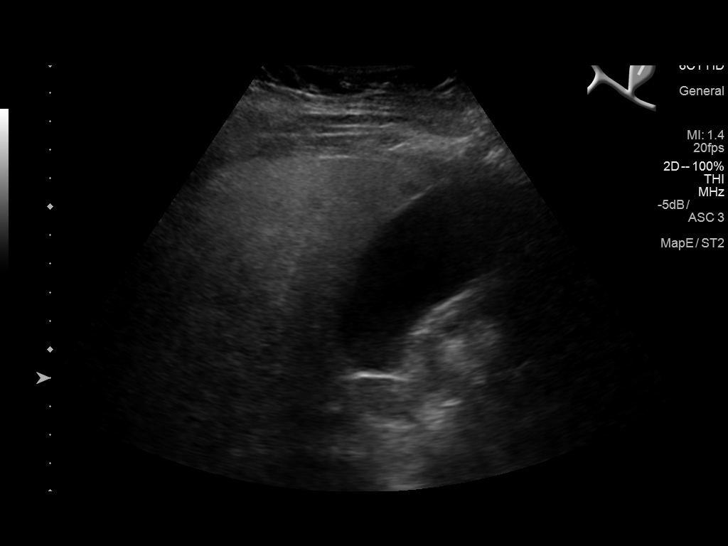
[im 16/46]
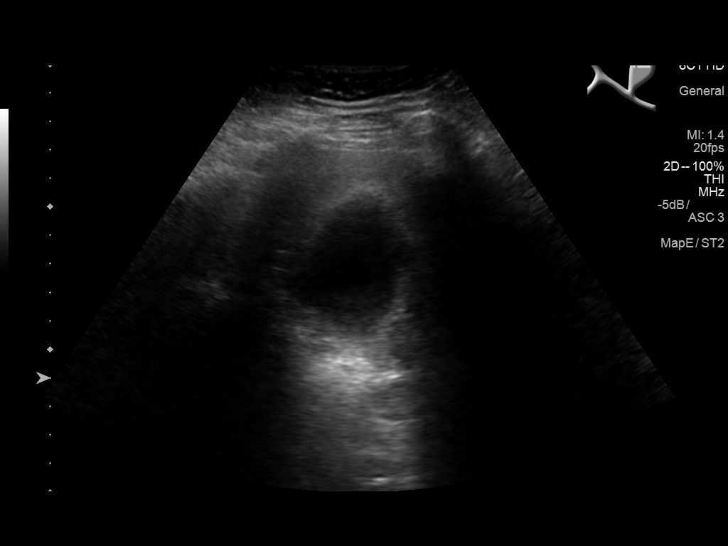
[im 17/46]
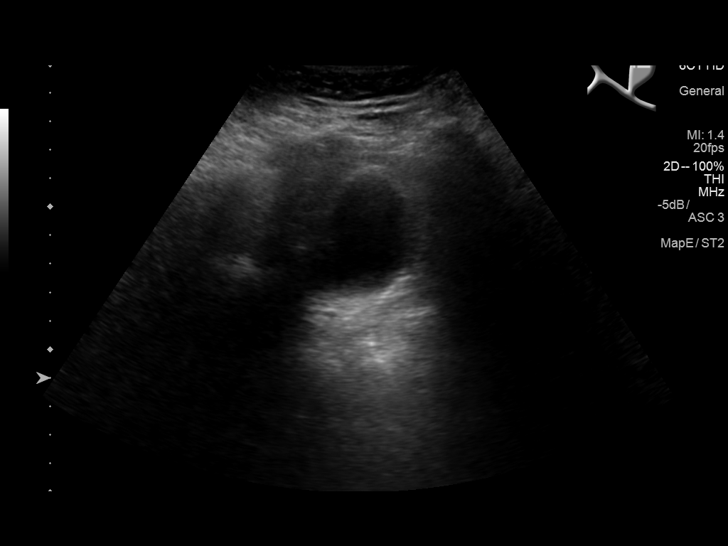
[im 21/46]
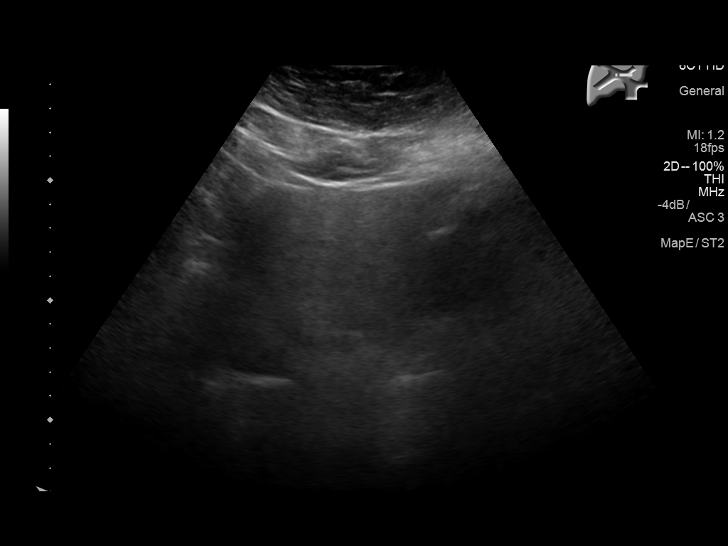
[im 25/46]
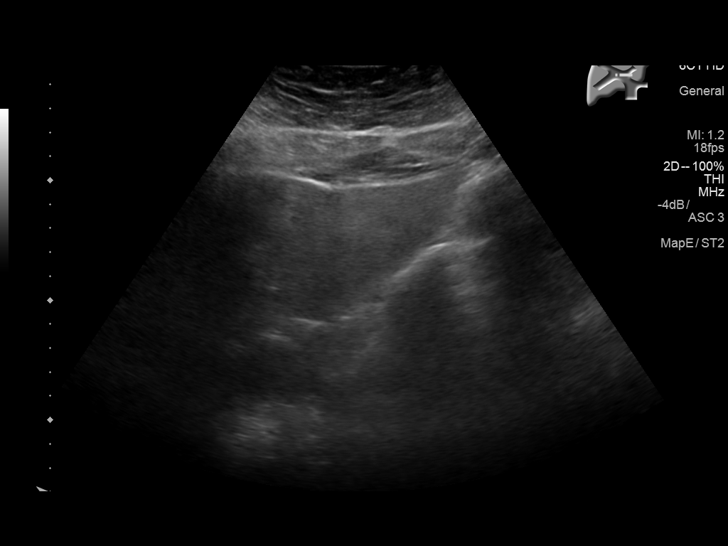
[im 29/46]
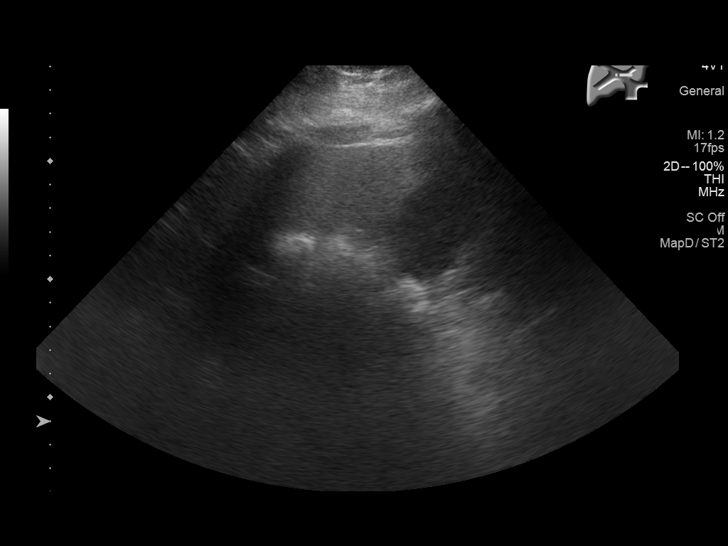
[im 31/46]
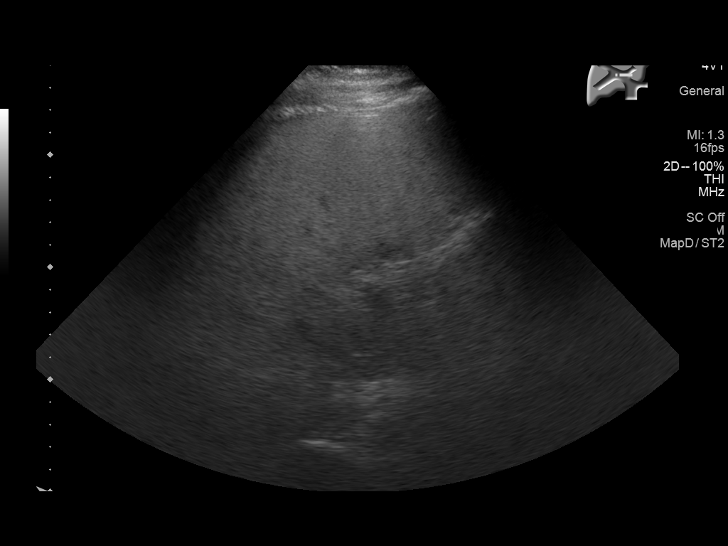
[im 34/46]
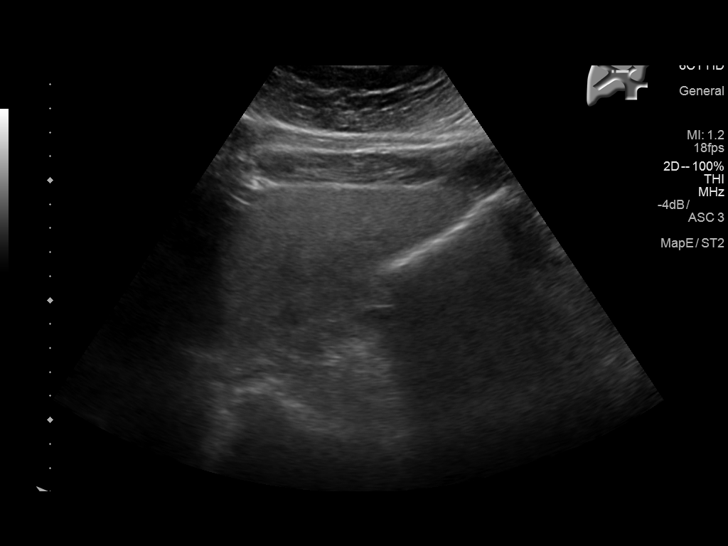
[im 38/46]
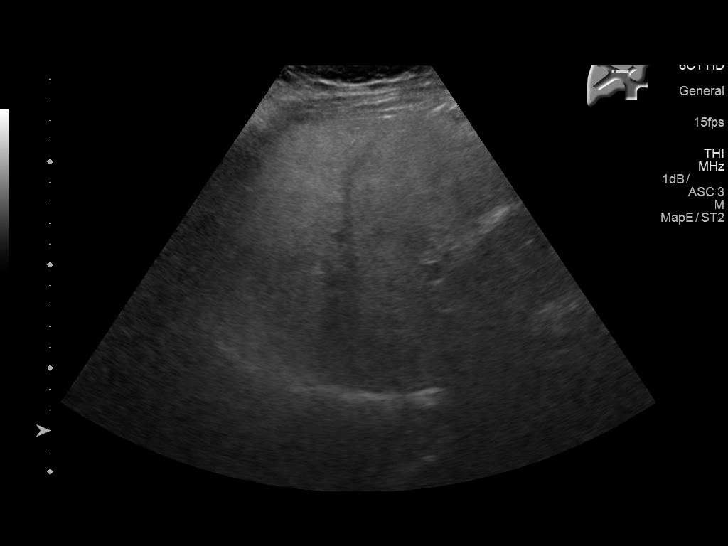
[im 42/46]
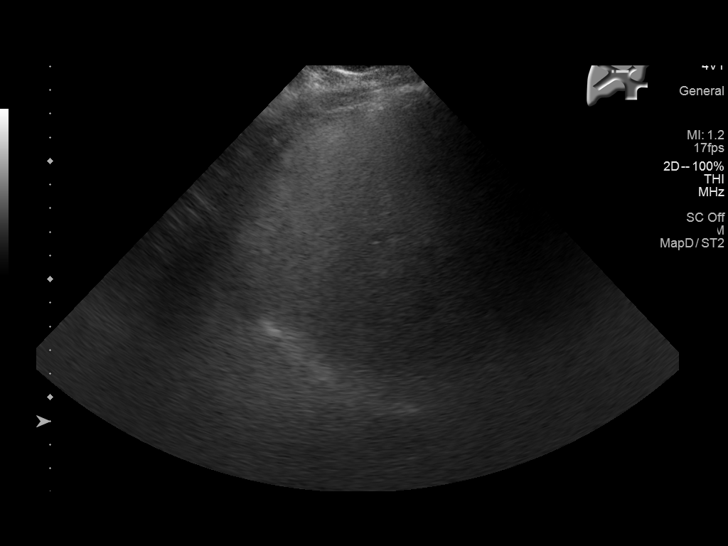
[im 46/46]
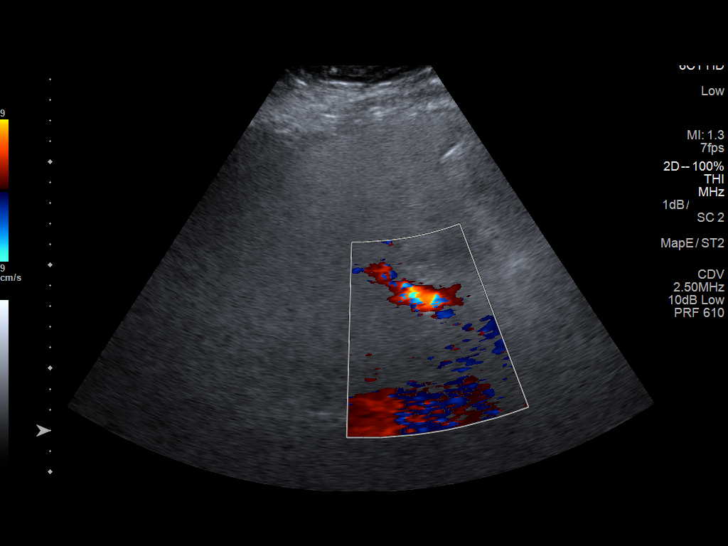

[14 of 25 positions shown; findings below may reference images not displayed]

FINDINGS: Gallbladder:

Small amount of sludge within the gallbladder. No visible stones,
wall thickening or sonographic Murphy sign.

Common bile duct:

Diameter: Normal caliber, 5 mm

Liver:

Increased echotexture throughout the liver compatible with fatty
infiltration. Area of focal fatty sparing near the gallbladder
fossa.
IMPRESSION: Fatty infiltration of the liver.

Small amount of gallbladder sludge without cholelithiasis or changes
of acute cholecystitis.

## 2018-02-12 ENCOUNTER — Other Ambulatory Visit: Payer: Self-pay | Admitting: Internal Medicine

## 2018-02-12 DIAGNOSIS — Z794 Long term (current) use of insulin: Secondary | ICD-10-CM

## 2018-02-12 DIAGNOSIS — I10 Essential (primary) hypertension: Secondary | ICD-10-CM

## 2018-02-12 DIAGNOSIS — E119 Type 2 diabetes mellitus without complications: Secondary | ICD-10-CM

## 2018-02-17 ENCOUNTER — Other Ambulatory Visit: Payer: Self-pay

## 2018-02-17 ENCOUNTER — Emergency Department (HOSPITAL_COMMUNITY)
Admission: EM | Admit: 2018-02-17 | Discharge: 2018-02-18 | Disposition: A | Discharge status: Home / Self Care | Payer: BLUE CROSS/BLUE SHIELD | Attending: Emergency Medicine | Admitting: Emergency Medicine

## 2018-02-17 DIAGNOSIS — E1165 Type 2 diabetes mellitus with hyperglycemia: Secondary | ICD-10-CM | POA: Diagnosis not present

## 2018-02-17 DIAGNOSIS — R739 Hyperglycemia, unspecified: Secondary | ICD-10-CM

## 2018-02-17 DIAGNOSIS — R112 Nausea with vomiting, unspecified: Secondary | ICD-10-CM | POA: Insufficient documentation

## 2018-02-17 LAB — CBC
HEMATOCRIT: 39.9 % (ref 39.0–52.0)
Hemoglobin: 13.4 g/dL (ref 13.0–17.0)
MCH: 27.8 pg (ref 26.0–34.0)
MCHC: 33.6 g/dL (ref 30.0–36.0)
MCV: 82.8 fL (ref 80.0–100.0)
Platelets: 223 10*3/uL (ref 150–400)
RBC: 4.82 MIL/uL (ref 4.22–5.81)
RDW: 14.6 % (ref 11.5–15.5)
WBC: 8 10*3/uL (ref 4.0–10.5)
nRBC: 0 % (ref 0.0–0.2)

## 2018-02-17 LAB — CBG MONITORING, ED: Glucose-Capillary: 294 mg/dL — ABNORMAL HIGH (ref 70–99)

## 2018-02-17 LAB — URINALYSIS, ROUTINE W REFLEX MICROSCOPIC
BACTERIA UA: NONE SEEN
BILIRUBIN URINE: NEGATIVE
Glucose, UA: >=500 mg/dL — AB
HGB URINE DIPSTICK: NEGATIVE
KETONES UR: 5 mg/dL — AB
Leukocytes, UA: NEGATIVE
NITRITE: NEGATIVE
PH: 5 (ref 5.0–8.0)
Protein, ur: NEGATIVE mg/dL
SPECIFIC GRAVITY, URINE: 1.021 (ref 1.005–1.030)

## 2018-02-17 LAB — COMPREHENSIVE METABOLIC PANEL
ALBUMIN: 4.8 g/dL (ref 3.5–5.0)
ALK PHOS: 58 U/L (ref 38–126)
ALT: 61 U/L — AB (ref 0–44)
AST: 39 U/L (ref 15–41)
Anion gap: 8 (ref 5–15)
BILIRUBIN TOTAL: 1.9 mg/dL — AB (ref 0.3–1.2)
BUN: 13 mg/dL (ref 6–20)
CHLORIDE: 103 mmol/L (ref 98–111)
CO2: 25 mmol/L (ref 22–32)
CREATININE: 0.92 mg/dL (ref 0.61–1.24)
Calcium: 8.9 mg/dL (ref 8.9–10.3)
Glucose, Bld: 304 mg/dL — ABNORMAL HIGH (ref 70–99)
Potassium: 3.9 mmol/L (ref 3.5–5.1)
SODIUM: 136 mmol/L (ref 135–145)
TOTAL PROTEIN: 8.2 g/dL — AB (ref 6.5–8.1)

## 2018-02-17 LAB — LIPASE, BLOOD: LIPASE: 28 U/L (ref 11–51)

## 2018-02-17 MED ORDER — ONDANSETRON HCL 4 MG/2ML IJ SOLN
4.0000 mg | Freq: Once | INTRAMUSCULAR | Status: AC
  Administered 2018-02-17: 4 mg via INTRAVENOUS
  Filled 2018-02-17: qty 2

## 2018-02-17 MED ORDER — SODIUM CHLORIDE 0.9 % IV BOLUS
1000.0000 mL | Freq: Once | INTRAVENOUS | Status: AC
  Administered 2018-02-17: 1000 mL via INTRAVENOUS

## 2018-02-17 NOTE — ED Triage Notes (Signed)
Pt to ed with c/o of emesis since Monday and hyperglycemia. Pt is diabetic type 2 and takes Metformin and insulin. Pt states he has just been vomiting anything that he eats. Pt states no nausea but has had a few episodes this week of diarrhea. Pt is A&O x4 and ambulatory.

## 2018-02-17 NOTE — ED Provider Notes (Signed)
[facility] DEPT Provider Note   CSN: [id number] Arrival date & time: 02/17/18  1951     History   Chief Complaint Chief Complaint  Patient presents with  . Emesis  . Hyperglycemia    HPI William Martin is a 37 y.o. male.  The history is provided by the patient and medical records.  Emesis    Hyperglycemia  Associated symptoms: nausea and vomiting      37 y.o. M with history of diabetes, GERD, tobacco abuse, hyperlipidemia, hypertension, presenting to the ED with nausea and vomiting.  Patient reports this is been ongoing for about 5 days.  States he has no issues drinking fluids but every time he tries to eat solid food he vomits it back up.  States this has overall decreased his appetite.  Denies any pain after eating or difficulty swallowing.  He denies any abdominal pain or diarrhea.  He has not had any fever, chills, cough, or other upper respiratory symptoms.  States his glucose at home has been running a lot higher than normal, 500 range.  States he checked his glucose yesterday and his meter just read "high".  States he has not done this in quite some time.  States he pretty much stopped taking his insulin regularly in April of this year and has not been back to his doctor for recheck.  He has continued taking his metformin, but admits sometimes he forgets that too.   Past Medical History:  Diagnosis Date  . Diabetes mellitus without complication ([place])   . GERD (gastroesophageal reflux disease)   . Tobacco abuse     Patient Active Problem List   Diagnosis Date Noted  . Immunization due 01/27/2017  . Essential hypertension 01/27/2017  . Former cigar smoker 01/27/2017  . Obesity 03/14/2016  . Hyperlipidemia 03/14/2016  . Hyperlipidemia due to type 2 diabetes mellitus ([place]) 03/14/2016  . New onset type 2 diabetes mellitus ([place]) 03/11/2016  . GERD (gastroesophageal reflux disease) 03/11/2016    No past surgical history on  file.      Home Medications    Prior to Admission medications   Medication Sig Start Date End Date Taking? Authorizing Provider  Alcohol Swabs (ALCOHOL PADS) 70 % PADS 1 application by Does not apply route 4 (four) times daily. Patient not taking: Reported on 11/12/2016 03/14/16   Dhungel, Flonnie Overman, MD  blood glucose meter kit and supplies KIT Dispense based on patient and insurance preference. Use up to four times daily as directed. (FOR ICD-9 250.00, 250.01). 01/20/17   Melynda Ripple, MD  insulin NPH-regular Human (NOVOLIN 70/30) (70-30) 100 UNIT/ML injection Inject 15 Units into the skin daily with supper. 01/27/17   Ladell Pier, MD  Insulin Syringe-Needle U-100 (INSULIN SYRINGE 1CC/28G) 28G X 1/2" 1 ML MISC 1 application by Does not apply route 2 (two) times daily. 11/12/16   Veryl Speak, MD  lisinopril (PRINIVIL,ZESTRIL) 10 MG tablet Take 1 tablet (10 mg total) by mouth daily. 01/27/17   Ladell Pier, MD  lovastatin (MEVACOR) 40 MG tablet Take 1 tablet (40 mg total) by mouth at bedtime. 01/27/17   Ladell Pier, MD  metFORMIN (GLUCOPHAGE) 1000 MG tablet Take 1 tablet (1,000 mg total) by mouth 2 (two) times daily with a meal. 01/27/17   Ladell Pier, MD  omeprazole (PRILOSEC OTC) 20 MG tablet Take 20 mg by mouth daily as needed (reflux/heartburn.).    [provider]    Family History Family History  Problem  Relation Age of Onset  . Diabetes type II Mother     Social History Social History   Tobacco Use  . Smoking status: Current Some Day Smoker  . Smokeless tobacco: Never Used  Substance Use Topics  . Alcohol use: Yes    Comment: occasional  . Drug use: No     Allergies   Bee venom   Review of Systems Review of Systems  Gastrointestinal: Positive for nausea and vomiting.  All other systems reviewed and are negative.    Physical Exam Updated Vital Signs BP (!) 146/97 (BP Location: Right Arm)   Pulse 97   Temp 98.1 F (36.7 C)  (Oral)   Resp 18   Ht _0  (1.778 m)   Wt 124.5 kg   SpO2 97%   BMI 39.37 kg/m   Physical Exam  Constitutional: He is oriented to person, place, and time. He appears well-developed and well-nourished.  Drinking water, NAD  HENT:  Head: Normocephalic and atraumatic.  Mouth/Throat: Oropharynx is clear and moist.  Dry mucous membranes  Eyes: Pupils are equal, round, and reactive to light. Conjunctivae and EOM are normal.  Neck: Normal range of motion.  Cardiovascular: Normal rate, regular rhythm and normal heart sounds.  Pulmonary/Chest: Effort normal and breath sounds normal. No stridor. No respiratory distress.  Abdominal: Soft. Bowel sounds are normal. There is no tenderness. There is no rebound.  Soft, benign  Musculoskeletal: Normal range of motion.  Neurological: He is alert and oriented to person, place, and time.  Skin: Skin is warm and dry.  Psychiatric: He has a normal mood and affect.  Nursing note and vitals reviewed.    ED Treatments / Results  Labs (all labs ordered are listed, but only abnormal results are displayed) Labs Reviewed  COMPREHENSIVE METABOLIC PANEL - Abnormal; Notable for the following components:      Result Value   Glucose, Bld 304 (*)    Total Protein 8.2 (*)    ALT 61 (*)    Total Bilirubin 1.9 (*)    All other components within normal limits  URINALYSIS, ROUTINE W REFLEX MICROSCOPIC - Abnormal; Notable for the following components:   Glucose, UA >=500 (*)    Ketones, ur 5 (*)    All other components within normal limits  CBG MONITORING, ED - Abnormal; Notable for the following components:   Glucose-Capillary 294 (*)    All other components within normal limits  CBG MONITORING, ED - Abnormal; Notable for the following components:   Glucose-Capillary 265 (*)    All other components within normal limits  LIPASE, BLOOD  CBC    EKG None  Radiology No results found.  Procedures Procedures (including critical care  time)  Medications Ordered in ED Medications  sodium chloride 0.9 % bolus 1,000 mL (1,000 mLs Intravenous New Bag/Given 02/17/18 2356)  ondansetron (ZOFRAN) injection 4 mg (4 mg Intravenous Given 02/17/18 2356)     Initial Impression / Assessment and Plan / ED Course  I have reviewed the triage vital signs and the nursing notes.  Pertinent labs & imaging results that were available during my care of the patient were reviewed by me and considered in my medical decision making (see chart for details).  37 year old male here with episodes of hyperglycemia.  States sugars have been in the 500 range and had one reading a few days ago "high".  He is awake, alert and peripherally oriented.  Vital signs are stable.  He does appear somewhat clinically dry.  Screening labs here with hyperglycemia but no evidence of DKA with normal bicarb and anion gap.  When talking with patient, it seems he has not had his nighttime insulin in several months as he has not been back to see his primary care doctor and ran out of refills.  He was treated here with IV fluids and Zofran with improvement of symptoms.  He has tolerated almost 500 cc of oral fluids without issue.  CBG improved to 265 with fluids alone.  Feel he is stable for discharge home.  Will restart his nighttime insulin at prior dose.  We will have him monitor sugars closely at home.  He will follow-up closely with his primary care doctor.  He will return here for any new or worsening symptoms.  Final Clinical Impressions(s) / ED Diagnoses   Final diagnoses:  Hyperglycemia  Non-intractable vomiting with nausea, unspecified vomiting type    ED Discharge Orders         Ordered    insulin NPH-regular Human (NOVOLIN 70/30) (70-30) 100 UNIT/ML injection  Daily with supper     02/18/18 0141    metFORMIN (GLUCOPHAGE) 1000 MG tablet  2 times daily     02/18/18 0141           Larene Pickett, PA-C 02/18/18 [place], Dan, DO 02/18/18 1504

## 2018-02-18 LAB — CBG MONITORING, ED: Glucose-Capillary: 265 mg/dL — ABNORMAL HIGH (ref 70–99)

## 2018-02-18 MED ORDER — METFORMIN HCL 1000 MG PO TABS: 1000.0000 mg | ORAL_TABLET | Freq: Two times a day (BID) | ORAL | 2 refills | Status: DC

## 2018-02-18 MED ORDER — INSULIN NPH ISOPHANE & REGULAR (70-30) 100 UNIT/ML ~~LOC~~ SUSP: 15.0000 [IU] | Freq: Every day | SUBCUTANEOUS | 2 refills | Status: DC

## 2018-02-18 NOTE — Discharge Instructions (Signed)
Take the prescribed medication as directed.  Keep a close check of your blood sugar at home.  Watch your carbs and sugar intake. Follow-up with your primary care doctor. Return to the ED for new or worsening symptoms.

## 2018-02-18 NOTE — ED Notes (Signed)
Pt ambulating well at DC to go home with son.  Pt ambulating well at DC.  Pt states understanding of DC instructions, readiness for DC.

## 2018-06-27 ENCOUNTER — Other Ambulatory Visit: Payer: Self-pay | Admitting: Internal Medicine

## 2018-06-27 DIAGNOSIS — E785 Hyperlipidemia, unspecified: Secondary | ICD-10-CM

## 2018-07-02 DIAGNOSIS — H1030 Unspecified acute conjunctivitis, unspecified eye: Secondary | ICD-10-CM | POA: Diagnosis not present

## 2018-07-05 DIAGNOSIS — H20012 Primary iridocyclitis, left eye: Secondary | ICD-10-CM | POA: Diagnosis not present

## 2018-07-05 DIAGNOSIS — H21542 Posterior synechiae (iris), left eye: Secondary | ICD-10-CM | POA: Diagnosis not present

## 2018-07-10 DIAGNOSIS — H21542 Posterior synechiae (iris), left eye: Secondary | ICD-10-CM | POA: Diagnosis not present

## 2018-07-10 DIAGNOSIS — H20012 Primary iridocyclitis, left eye: Secondary | ICD-10-CM | POA: Diagnosis not present

## 2018-07-22 ENCOUNTER — Other Ambulatory Visit: Payer: Self-pay | Admitting: Internal Medicine

## 2018-07-22 DIAGNOSIS — E785 Hyperlipidemia, unspecified: Secondary | ICD-10-CM

## 2018-07-31 ENCOUNTER — Other Ambulatory Visit: Payer: Self-pay

## 2018-07-31 ENCOUNTER — Ambulatory Visit: Payer: BLUE CROSS/BLUE SHIELD | Attending: Internal Medicine | Admitting: Internal Medicine

## 2018-07-31 ENCOUNTER — Encounter: Payer: Self-pay | Admitting: Internal Medicine

## 2018-07-31 DIAGNOSIS — F17200 Nicotine dependence, unspecified, uncomplicated: Secondary | ICD-10-CM

## 2018-07-31 DIAGNOSIS — I10 Essential (primary) hypertension: Secondary | ICD-10-CM

## 2018-07-31 DIAGNOSIS — E669 Obesity, unspecified: Secondary | ICD-10-CM

## 2018-07-31 DIAGNOSIS — E1169 Type 2 diabetes mellitus with other specified complication: Secondary | ICD-10-CM | POA: Diagnosis not present

## 2018-07-31 DIAGNOSIS — E785 Hyperlipidemia, unspecified: Secondary | ICD-10-CM

## 2018-07-31 MED ORDER — LOVASTATIN 40 MG PO TABS: 40.0000 mg | ORAL_TABLET | Freq: Every day | ORAL | 3 refills | Status: DC

## 2018-07-31 MED ORDER — METFORMIN HCL 1000 MG PO TABS: 1000.0000 mg | ORAL_TABLET | Freq: Two times a day (BID) | ORAL | 3 refills | Status: DC

## 2018-07-31 MED ORDER — INSULIN NPH ISOPHANE & REGULAR (70-30) 100 UNIT/ML ~~LOC~~ SUSP: 15.0000 [IU] | Freq: Every day | SUBCUTANEOUS | 3 refills | Status: DC

## 2018-07-31 MED ORDER — LISINOPRIL 10 MG PO TABS: 10.0000 mg | ORAL_TABLET | Freq: Every day | ORAL | 3 refills | Status: DC

## 2018-07-31 NOTE — Progress Notes (Signed)
Virtual Visit via Telephone Note Due to current restrictions/limitations of in-office visits due to the COVID-19 pandemic, this scheduled clinical appointment was converted to a telehealth visit  I connected with William Martin on 07/31/18 at 4:20 p.m by telephone from my office and verified that I am speaking with the correct person using two identifiers.  Patient is at home.  Only the patient and myself participated in this encounter   I discussed the limitations, risks, security and privacy concerns of performing an evaluation and management service by telephone and the availability of in person appointments. I also discussed with the patient that there may be a patient responsible charge related to this service. The patient expressed understanding and agreed to proceed.   History of Present Illness: Patient with history of DM, HTN, HL, obesity, tobacco dependence.  Patient last seen 01/2017.   Pt states he started working new job 04/2017 5 days a wk 12 hr shifts and just did not have the time to follow-up.  DM/Obesity:  Has device but does not check BS Meds:  Reports compliance with Metformin and Novolin 70/30 15 units/daily Eating habits:  Making progress. Exercise: "not as much as I should" due to work schedule.  No blurred vision.  Reportedly saw eye doctor [facility]) 3 x last mth for an issue that he was having with his eyes; told his duct was not draining properly.  No diabetic retinopathy No numbness in feet wgh has remained around 170 lbs.    HYPERTENSION Currently taking: see medication list Med Adherence: []  Yes    [x]  No, out of Lisinopril for a while Medication side effects: []  Yes    [x]  No Adherence with salt restriction: [x]  Yes    []  No Home Monitoring?: []  Yes    [x]  No, no device at home Monitoring Frequency: []  Yes    []  No Home BP results range: []  Yes    []  No SOB? []  Yes    [x]  No Chest Pain?: []  Yes    [x]  No Leg swelling?: []  Yes    [x]   No Headaches?: [x]  Yes, occasional HA     Dizziness? []  Yes    [x]  No Comments:   HL: was out of Lovastatin for a period before recent RF.  Tolerating the med  Tob dep:  Does not smoke cigarettes.  Smokes cigars.  Not ready to quit.    Observations/Objective:  No direct observation done as this was a telephone encounter Assessment and Plan: 1. Diabetes mellitus type 2 in obese [facility]) Encourage patient to check blood sugars at least twice a day with goal being 90-130 before meals and less than 180 2 hours after meal. Healthy eating habits discussed and encouraged. Patient will come fasting to the lab at his earliest convenience to have blood test done - insulin NPH-regular Human (NOVOLIN 70/30) (70-30) 100 UNIT/ML injection; Inject 15 Units into the skin daily with supper.  Dispense: 10 mL; Refill: 3 - metFORMIN (GLUCOPHAGE) 1000 MG tablet; Take 1 tablet (1,000 mg total) by mouth 2 (two) times daily.  Dispense: 60 tablet; Refill: 3 - CBC; Future - Comprehensive metabolic panel; Future - Lipid panel; Future - Hemoglobin A1c; Future - Microalbumin / creatinine urine ratio; Future  2. Essential hypertension Advised to restart lisinopril.  I recommend sending a prescription to his pharmacy to get a home blood pressure monitoring device but patient declined.  Discussed health risks associated with uncontrolled blood pressure - lisinopril (ZESTRIL) 10 MG tablet;  Take 1 tablet (10 mg total) by mouth daily.  Dispense: 90 tablet; Refill: 3  3. Hyperlipidemia, unspecified hyperlipidemia type - lovastatin (MEVACOR) 40 MG tablet; Take 1 tablet (40 mg total) by mouth at bedtime.  Dispense: 30 tablet; Refill: 3  4. Tobacco dependence Patient advised to quit smoking. Discussed health risks associated with smoking including lung and other types of cancers, chronic lung diseases and CV risks.. Pt not ready to give trail of quitting.  Less than 5 minutes spent on counseling  Follow Up  Instructions: 2 mths   I discussed the assessment and treatment plan with the patient. The patient was provided an opportunity to ask questions and all were answered. The patient agreed with the plan and demonstrated an understanding of the instructions.   The patient was advised to call back or seek an in-person evaluation if the symptoms worsen or if the condition fails to improve as anticipated.  I provided 16 minutes of non-face-to-face time during this encounter.   Jonah Blueeborah Johnson, MD

## 2018-08-15 ENCOUNTER — Other Ambulatory Visit: Payer: Self-pay | Admitting: Internal Medicine

## 2018-08-15 DIAGNOSIS — H20012 Primary iridocyclitis, left eye: Secondary | ICD-10-CM | POA: Diagnosis not present

## 2018-08-15 LAB — HM DIABETES EYE EXAM

## 2018-08-22 DIAGNOSIS — H20012 Primary iridocyclitis, left eye: Secondary | ICD-10-CM | POA: Diagnosis not present

## 2018-10-01 ENCOUNTER — Ambulatory Visit: Payer: BLUE CROSS/BLUE SHIELD | Admitting: Internal Medicine

## 2018-12-27 ENCOUNTER — Encounter (HOSPITAL_COMMUNITY): Payer: Self-pay | Admitting: Emergency Medicine

## 2018-12-27 ENCOUNTER — Other Ambulatory Visit: Payer: Self-pay

## 2018-12-27 ENCOUNTER — Emergency Department (HOSPITAL_COMMUNITY)
Admission: EM | Admit: 2018-12-27 | Discharge: 2018-12-27 | Disposition: A | Discharge status: Home / Self Care | Payer: BC Managed Care – PPO | Attending: Emergency Medicine | Admitting: Emergency Medicine

## 2018-12-27 DIAGNOSIS — E1169 Type 2 diabetes mellitus with other specified complication: Secondary | ICD-10-CM | POA: Insufficient documentation

## 2018-12-27 DIAGNOSIS — I10 Essential (primary) hypertension: Secondary | ICD-10-CM | POA: Insufficient documentation

## 2018-12-27 DIAGNOSIS — E1165 Type 2 diabetes mellitus with hyperglycemia: Secondary | ICD-10-CM | POA: Insufficient documentation

## 2018-12-27 DIAGNOSIS — R739 Hyperglycemia, unspecified: Secondary | ICD-10-CM

## 2018-12-27 DIAGNOSIS — F17200 Nicotine dependence, unspecified, uncomplicated: Secondary | ICD-10-CM | POA: Insufficient documentation

## 2018-12-27 DIAGNOSIS — E785 Hyperlipidemia, unspecified: Secondary | ICD-10-CM | POA: Insufficient documentation

## 2018-12-27 DIAGNOSIS — Z794 Long term (current) use of insulin: Secondary | ICD-10-CM | POA: Insufficient documentation

## 2018-12-27 DIAGNOSIS — Z79899 Other long term (current) drug therapy: Secondary | ICD-10-CM | POA: Insufficient documentation

## 2018-12-27 LAB — COMPREHENSIVE METABOLIC PANEL
ALT: 42 U/L (ref 0–44)
AST: 25 U/L (ref 15–41)
Albumin: 5.3 g/dL — ABNORMAL HIGH (ref 3.5–5.0)
Alkaline Phosphatase: 69 U/L (ref 38–126)
Anion gap: 15 (ref 5–15)
BUN: 25 mg/dL — ABNORMAL HIGH (ref 6–20)
CO2: 25 mmol/L (ref 22–32)
Calcium: 9.6 mg/dL (ref 8.9–10.3)
Chloride: 94 mmol/L — ABNORMAL LOW (ref 98–111)
Creatinine, Ser: 1.31 mg/dL — ABNORMAL HIGH (ref 0.61–1.24)
GFR calc Af Amer: >60 mL/min (ref >60)
GFR calc non Af Amer: >60 mL/min (ref >60)
Glucose, Bld: 400 mg/dL — ABNORMAL HIGH (ref 70–99)
Potassium: 3.8 mmol/L (ref 3.5–5.1)
Sodium: 134 mmol/L — ABNORMAL LOW (ref 135–145)
Total Bilirubin: 2.7 mg/dL — ABNORMAL HIGH (ref 0.3–1.2)
Total Protein: 9.1 g/dL — ABNORMAL HIGH (ref 6.5–8.1)

## 2018-12-27 LAB — BLOOD GAS, VENOUS
Acid-Base Excess: 3.3 mmol/L — ABNORMAL HIGH (ref 0.0–2.0)
Bicarbonate: 29 mmol/L — ABNORMAL HIGH (ref 20.0–28.0)
O2 Saturation: 40.2 %
Patient temperature: 98.6
pCO2, Ven: 50.4 mmHg (ref 44.0–60.0)
pH, Ven: 7.379 (ref 7.250–7.430)

## 2018-12-27 LAB — URINALYSIS, ROUTINE W REFLEX MICROSCOPIC
Bacteria, UA: NONE SEEN
Bilirubin Urine: NEGATIVE
Glucose, UA: >=500 mg/dL — AB
Hgb urine dipstick: NEGATIVE
Ketones, ur: 5 mg/dL — AB
Leukocytes,Ua: NEGATIVE
Nitrite: NEGATIVE
Protein, ur: NEGATIVE mg/dL
Specific Gravity, Urine: 1.016 (ref 1.005–1.030)
pH: 5 (ref 5.0–8.0)

## 2018-12-27 LAB — CBC
HCT: 42 % (ref 39.0–52.0)
Hemoglobin: 14 g/dL (ref 13.0–17.0)
MCH: 27.2 pg (ref 26.0–34.0)
MCHC: 33.3 g/dL (ref 30.0–36.0)
MCV: 81.7 fL (ref 80.0–100.0)
Platelets: 291 10*3/uL (ref 150–400)
RBC: 5.14 MIL/uL (ref 4.22–5.81)
RDW: 14.5 % (ref 11.5–15.5)
WBC: 10.5 10*3/uL (ref 4.0–10.5)
nRBC: 0 % (ref 0.0–0.2)

## 2018-12-27 LAB — LIPASE, BLOOD: Lipase: 21 U/L (ref 11–51)

## 2018-12-27 LAB — CBG MONITORING, ED: Glucose-Capillary: 375 mg/dL — ABNORMAL HIGH (ref 70–99)

## 2018-12-27 MED ORDER — INSULIN ASPART 100 UNIT/ML ~~LOC~~ SOLN
10.0000 [IU] | Freq: Once | SUBCUTANEOUS | Status: AC
  Administered 2018-12-27: 17:00:00 10 [IU] via SUBCUTANEOUS
  Filled 2018-12-27: qty 0.1

## 2018-12-27 MED ORDER — SODIUM CHLORIDE 0.9 % IV SOLN: INTRAVENOUS | Status: DC

## 2018-12-27 MED ORDER — SODIUM CHLORIDE 0.9 % IV BOLUS
2000.0000 mL | Freq: Once | INTRAVENOUS | Status: AC
  Administered 2018-12-27: 2000 mL via INTRAVENOUS

## 2018-12-27 NOTE — ED Provider Notes (Signed)
[facility] DEPT Provider Note   CSN: [id number] Arrival date & time: 12/27/18  1331     History   Chief Complaint Chief Complaint  Patient presents with  . Emesis  . Polydipsia    HPI Jovahn Pursley is a 38 y.o. male.     38 year old male with history of type 2 diabetes presents with polyuria and polydipsia.  He is also had some emesis but denies any abdominal discomfort.  Patient states that he has not been compliant with a diabetic diet.  Has had some dizziness when he stands up.  No recent fever or chills.  No cough or congestion.  States compliance with his diabetic medications.     Past Medical History:  Diagnosis Date  . Diabetes mellitus without complication ([place])   . GERD (gastroesophageal reflux disease)   . Tobacco abuse     Patient Active Problem List   Diagnosis Date Noted  . Tobacco dependence 07/31/2018  . Immunization due 01/27/2017  . Essential hypertension 01/27/2017  . Former cigar smoker 01/27/2017  . Obesity 03/14/2016  . Hyperlipidemia 03/14/2016  . Hyperlipidemia due to type 2 diabetes mellitus ([place]) 03/14/2016  . New onset type 2 diabetes mellitus ([place]) 03/11/2016  . GERD (gastroesophageal reflux disease) 03/11/2016    History reviewed. No pertinent surgical history.      Home Medications    Prior to Admission medications   Medication Sig Start Date End Date Taking? Authorizing Provider  Alcohol Swabs (ALCOHOL PADS) 70 % PADS 1 application by Does not apply route 4 (four) times daily. Patient not taking: Reported on 11/12/2016 03/14/16   Dhungel, Flonnie Overman, MD  B-D INS SYR ULTRAFINE 1CC/30G 30G X 1/2" 1 ML MISC USE AS DIRECTED TWICE A DAY 08/15/18   Ladell Pier, MD  blood glucose meter kit and supplies KIT Dispense based on patient and insurance preference. Use up to four times daily as directed. (FOR ICD-9 250.00, 250.01). 01/20/17   Melynda Ripple, MD  insulin NPH-regular Human (NOVOLIN 70/30)  (70-30) 100 UNIT/ML injection Inject 15 Units into the skin daily with supper. 07/31/18   Ladell Pier, MD  lisinopril (ZESTRIL) 10 MG tablet Take 1 tablet (10 mg total) by mouth daily. 07/31/18   Ladell Pier, MD  lovastatin (MEVACOR) 40 MG tablet Take 1 tablet (40 mg total) by mouth at bedtime. 07/31/18   Ladell Pier, MD  metFORMIN (GLUCOPHAGE) 1000 MG tablet Take 1 tablet (1,000 mg total) by mouth 2 (two) times daily. 07/31/18   Ladell Pier, MD  omeprazole (PRILOSEC OTC) 20 MG tablet Take 20 mg by mouth daily as needed (reflux/heartburn.).    [provider]    Family History Family History  Problem Relation Age of Onset  . Diabetes type II Mother     Social History Social History   Tobacco Use  . Smoking status: Current Some Day Smoker  . Smokeless tobacco: Never Used  Substance Use Topics  . Alcohol use: Yes    Comment: occasional  . Drug use: No     Allergies   Bee venom   Review of Systems Review of Systems  All other systems reviewed and are negative.    Physical Exam Updated Vital Signs BP 122/80 (BP Location: Left Arm)   Pulse (!) 113   Temp 97.9 F (36.6 C) (Oral)   Resp 17   Ht 1.778 m (5' 10" )   SpO2 96%   BMI 39.37 kg/m   Physical Exam  Vitals signs and nursing note reviewed.  Constitutional:      General: He is not in acute distress.    Appearance: Normal appearance. He is well-developed. He is not toxic-appearing.  HENT:     Head: Normocephalic and atraumatic.  Eyes:     General: Lids are normal.     Conjunctiva/sclera: Conjunctivae normal.     Pupils: Pupils are equal, round, and reactive to light.  Neck:     Musculoskeletal: Normal range of motion and neck supple.     Thyroid: No thyroid mass.     Trachea: No tracheal deviation.  Cardiovascular:     Rate and Rhythm: Regular rhythm. Tachycardia present.     Heart sounds: Normal heart sounds. No murmur. No gallop.   Pulmonary:     Effort: Pulmonary effort is  normal. No respiratory distress.     Breath sounds: Normal breath sounds. No stridor. No decreased breath sounds, wheezing, rhonchi or rales.  Abdominal:     General: Bowel sounds are normal. There is no distension.     Palpations: Abdomen is soft.     Tenderness: There is no abdominal tenderness. There is no rebound.  Musculoskeletal: Normal range of motion.        General: No tenderness.  Skin:    General: Skin is warm and dry.     Findings: No abrasion or rash.  Neurological:     Mental Status: He is alert and oriented to person, place, and time.     GCS: GCS eye subscore is 4. GCS verbal subscore is 5. GCS motor subscore is 6.     Cranial Nerves: No cranial nerve deficit.     Sensory: No sensory deficit.  Psychiatric:        Speech: Speech normal.        Behavior: Behavior normal.      ED Treatments / Results  Labs (all labs ordered are listed, but only abnormal results are displayed) Labs Reviewed  COMPREHENSIVE METABOLIC PANEL - Abnormal; Notable for the following components:      Result Value   Sodium 134 (*)    Chloride 94 (*)    Glucose, Bld 400 (*)    BUN 25 (*)    Creatinine, Ser 1.31 (*)    Total Protein 9.1 (*)    Albumin 5.3 (*)    Total Bilirubin 2.7 (*)    All other components within normal limits  LIPASE, BLOOD  CBC  URINALYSIS, ROUTINE W REFLEX MICROSCOPIC  BLOOD GAS, VENOUS    EKG None  Radiology No results found.  Procedures Procedures (including critical care time)  Medications Ordered in ED Medications  sodium chloride 0.9 % bolus 2,000 mL (has no administration in time range)  0.9 %  sodium chloride infusion (has no administration in time range)  insulin aspart (novoLOG) injection 10 Units (has no administration in time range)     Initial Impression / Assessment and Plan / ED Course  I have reviewed the triage vital signs and the nursing notes.  Pertinent labs & imaging results that were available during my care of the patient  were reviewed by me and considered in my medical decision making (see chart for details).        Patient without evidence of ketoacidosis.  Given IV fluids as well as insulin here and blood sugar has now started to trend lower.  Patient able to drink Coke 0 here without any trouble.  Patient instructed to be compliant with his  diabetic diet as well as take his medications as directed.  Return precautions given  Final Clinical Impressions(s) / ED Diagnoses   Final diagnoses:  None    ED Discharge Orders    None       Lacretia Leigh, MD 12/27/18 1729

## 2018-12-27 NOTE — ED Triage Notes (Signed)
Pt reports having "diabetic sickness". Got patient to be more specific with vomiting after drinking and being thirsty for 2 days.

## 2018-12-27 NOTE — ED Notes (Addendum)
   Test: venous blood gas PO2 Critical Value: below reportable range  Name of Provider Notified:Allen

## 2018-12-27 NOTE — Discharge Instructions (Addendum)
Please follow a diabetic diet and take your medications as directed

## 2019-01-09 ENCOUNTER — Other Ambulatory Visit: Payer: Self-pay

## 2019-01-09 ENCOUNTER — Emergency Department (HOSPITAL_COMMUNITY)
Admission: EM | Admit: 2019-01-09 | Discharge: 2019-01-09 | Disposition: A | Discharge status: Home / Self Care | Payer: Self-pay | Attending: Emergency Medicine | Admitting: Emergency Medicine

## 2019-01-09 DIAGNOSIS — Z79899 Other long term (current) drug therapy: Secondary | ICD-10-CM | POA: Insufficient documentation

## 2019-01-09 DIAGNOSIS — R739 Hyperglycemia, unspecified: Secondary | ICD-10-CM

## 2019-01-09 DIAGNOSIS — Z794 Long term (current) use of insulin: Secondary | ICD-10-CM | POA: Insufficient documentation

## 2019-01-09 DIAGNOSIS — I10 Essential (primary) hypertension: Secondary | ICD-10-CM | POA: Insufficient documentation

## 2019-01-09 DIAGNOSIS — E1165 Type 2 diabetes mellitus with hyperglycemia: Secondary | ICD-10-CM | POA: Insufficient documentation

## 2019-01-09 DIAGNOSIS — E1169 Type 2 diabetes mellitus with other specified complication: Secondary | ICD-10-CM

## 2019-01-09 DIAGNOSIS — R1110 Vomiting, unspecified: Secondary | ICD-10-CM | POA: Insufficient documentation

## 2019-01-09 DIAGNOSIS — E785 Hyperlipidemia, unspecified: Secondary | ICD-10-CM

## 2019-01-09 DIAGNOSIS — F17200 Nicotine dependence, unspecified, uncomplicated: Secondary | ICD-10-CM | POA: Insufficient documentation

## 2019-01-09 LAB — CBC
HCT: 39.7 % (ref 39.0–52.0)
Hemoglobin: 13.3 g/dL (ref 13.0–17.0)
MCH: 27.5 pg (ref 26.0–34.0)
MCHC: 33.5 g/dL (ref 30.0–36.0)
MCV: 82 fL (ref 80.0–100.0)
Platelets: 254 10*3/uL (ref 150–400)
RBC: 4.84 MIL/uL (ref 4.22–5.81)
RDW: 14.6 % (ref 11.5–15.5)
WBC: 10.2 10*3/uL (ref 4.0–10.5)
nRBC: 0 % (ref 0.0–0.2)

## 2019-01-09 LAB — COMPREHENSIVE METABOLIC PANEL
ALT: 55 U/L — ABNORMAL HIGH (ref 0–44)
AST: 27 U/L (ref 15–41)
Albumin: 4.7 g/dL (ref 3.5–5.0)
Alkaline Phosphatase: 65 U/L (ref 38–126)
Anion gap: 14 (ref 5–15)
BUN: 16 mg/dL (ref 6–20)
CO2: 22 mmol/L (ref 22–32)
Calcium: 9.3 mg/dL (ref 8.9–10.3)
Chloride: 98 mmol/L (ref 98–111)
Creatinine, Ser: 1.18 mg/dL (ref 0.61–1.24)
GFR calc Af Amer: >60 mL/min (ref >60)
GFR calc non Af Amer: >60 mL/min (ref >60)
Glucose, Bld: 411 mg/dL — ABNORMAL HIGH (ref 70–99)
Potassium: 4.1 mmol/L (ref 3.5–5.1)
Sodium: 134 mmol/L — ABNORMAL LOW (ref 135–145)
Total Bilirubin: 3 mg/dL — ABNORMAL HIGH (ref 0.3–1.2)
Total Protein: 8.1 g/dL (ref 6.5–8.1)

## 2019-01-09 LAB — CBG MONITORING, ED
Glucose-Capillary: 398 mg/dL — ABNORMAL HIGH (ref 70–99)
Glucose-Capillary: 298 mg/dL — ABNORMAL HIGH (ref 70–99)

## 2019-01-09 LAB — URINALYSIS, ROUTINE W REFLEX MICROSCOPIC
Bilirubin Urine: NEGATIVE
Glucose, UA: >=500 mg/dL — AB
Hgb urine dipstick: NEGATIVE
Ketones, ur: 20 mg/dL — AB
Leukocytes,Ua: NEGATIVE
Nitrite: NEGATIVE
Protein, ur: NEGATIVE mg/dL
Specific Gravity, Urine: 1.02 (ref 1.005–1.030)
pH: 5 (ref 5.0–8.0)

## 2019-01-09 LAB — LIPASE, BLOOD: Lipase: 25 U/L (ref 11–51)

## 2019-01-09 MED ORDER — INSULIN ASPART 100 UNIT/ML ~~LOC~~ SOLN
3.0000 [IU] | Freq: Once | SUBCUTANEOUS | Status: AC
  Administered 2019-01-09: 3 [IU] via INTRAVENOUS
  Filled 2019-01-09: qty 0.03

## 2019-01-09 MED ORDER — NOVOLIN 70/30 (70-30) 100 UNIT/ML ~~LOC~~ SUSP: 15.0000 [IU] | Freq: Every day | SUBCUTANEOUS | 3 refills | Status: DC

## 2019-01-09 MED ORDER — LISINOPRIL 10 MG PO TABS: 10.0000 mg | ORAL_TABLET | Freq: Every day | ORAL | 3 refills | Status: DC

## 2019-01-09 MED ORDER — ONDANSETRON HCL 4 MG/2ML IJ SOLN
4.0000 mg | Freq: Once | INTRAMUSCULAR | Status: AC
  Administered 2019-01-09: 4 mg via INTRAVENOUS
  Filled 2019-01-09: qty 2

## 2019-01-09 MED ORDER — LOVASTATIN 40 MG PO TABS: 40.0000 mg | ORAL_TABLET | Freq: Every day | ORAL | 3 refills | Status: DC

## 2019-01-09 MED ORDER — SODIUM CHLORIDE 0.9 % IV BOLUS
1000.0000 mL | Freq: Once | INTRAVENOUS | Status: AC
  Administered 2019-01-09: 1000 mL via INTRAVENOUS

## 2019-01-09 MED ORDER — INSULIN ASPART 100 UNIT/ML ~~LOC~~ SOLN
6.0000 [IU] | Freq: Once | SUBCUTANEOUS | Status: AC
  Administered 2019-01-09: 6 [IU] via INTRAVENOUS
  Filled 2019-01-09: qty 0.06

## 2019-01-09 MED ORDER — METFORMIN HCL 1000 MG PO TABS: 1000.0000 mg | ORAL_TABLET | Freq: Two times a day (BID) | ORAL | 3 refills | Status: DC

## 2019-01-09 MED ORDER — SODIUM CHLORIDE 0.9% FLUSH
3.0000 mL | Freq: Once | INTRAVENOUS | Status: AC
  Administered 2019-01-09: 12:00:00 3 mL via INTRAVENOUS

## 2019-01-09 MED ORDER — OMEPRAZOLE 20 MG PO CPDR: 20.0000 mg | DELAYED_RELEASE_CAPSULE | Freq: Every day | ORAL | 0 refills | Status: DC | PRN

## 2019-01-09 MED FILL — NOVOLIN 70/30 100 UNITS/ML: (70-30) 100 | 28 days supply | Qty: 10 | Fill #0

## 2019-01-09 MED FILL — OMEPRAZOLE 20 MG CAP: 20 | 30 days supply | Qty: 30 | Fill #0

## 2019-01-09 MED FILL — LOVASTATIN 40 MG TABS: 40 | 30 days supply | Qty: 30 | Fill #0

## 2019-01-09 MED FILL — LISINOPRIL 10 MG TABS: 10 | 30 days supply | Qty: 30 | Fill #0

## 2019-01-09 MED FILL — metFORMIN HCL 1000 MG TABS: 1000 | 30 days supply | Qty: 60 | Fill #0

## 2019-01-09 NOTE — TOC Initial Note (Signed)
Transition of Care [facility]) - Initial/Assessment Note    Patient Details  Name: William Martin MRN: [id number] Date of Birth: Sep 04, 1980  [facility] CM/SW Contact:    Erenest Rasher, RN Phone Number: [phone] 01/09/2019, 2:57 PM  Clinical Narrative:                  Spoke to pt and states he lost his [employer] that he was paying for out of pocket. He recently started a new job but funds are limited for meds copay. Will use [facility] for pt with $3 copay and he can use once per year. Explained to pt that [facility] will offer a discount for meds and he can use that pharmacy.    Expected Discharge Plan: Home/Self Care Barriers to Discharge: No Barriers Identified   Patient Goals and CMS Choice        Expected Discharge Plan and Services Expected Discharge Plan: Home/Self Care   Discharge Planning Services: CM Consult, Medication Assistance, [place] Program   Living arrangements for the past 2 months: Apartment                                      Prior Living Arrangements/Services Living arrangements for the past 2 months: Apartment Lives with:: Self Patient language and need for interpreter reviewed:: Yes Do you feel safe going back to the place where you live?: Yes      Need for Family Participation in Patient Care: No (Comment) Care giver support system in place?: No (comment)   Criminal Activity/Legal Involvement Pertinent to Current Situation/Hospitalization: No - Comment as needed  Activities of Daily Living      Permission Sought/Granted Permission sought to share information with : Case Manager, PCP, Family Supports Permission granted to share information with : Yes, Verbal Permission Granted              Emotional Assessment   Attitude/Demeanor/Rapport: Engaged Affect (typically observed): Accepting Orientation: : Oriented to Self, Oriented to Place, Oriented to  Time, Oriented to Situation   Psych Involvement: No (comment)  Admission  diagnosis:  emesis Patient Active Problem List   Diagnosis Date Noted  . Tobacco dependence 07/31/2018  . Immunization due 01/27/2017  . Essential hypertension 01/27/2017  . Former cigar smoker 01/27/2017  . Obesity 03/14/2016  . Hyperlipidemia 03/14/2016  . Hyperlipidemia due to type 2 diabetes mellitus ([place]) 03/14/2016  . New onset type 2 diabetes mellitus ([place]) 03/11/2016  . GERD (gastroesophageal reflux disease) 03/11/2016   PCP:  Ladell Pier, MD Pharmacy:   [facility], [address] [place] [address] Phone: [phone] Fax: [phone]  [id number] - [place], [place] [address] [place] [address] Phone: [phone] Fax: [phone]     Social Determinants of Health (SDOH) Interventions    Readmission Risk Interventions No flowsheet data found.

## 2019-01-09 NOTE — ED Provider Notes (Signed)
[facility] DEPT Provider Note   CSN: [id number] Arrival date & time: 01/09/19  2585     History   Chief Complaint Chief Complaint  Patient presents with  . Diarrhea  . Emesis    HPI William Martin is a 38 y.o. male past history of diabetes, GERD who presents for evaluation of vomiting that began today.  Patient states that he has had multiple episodes of nonbloody, nonbilious vomiting.  He also reports some episodes of nonbloody diarrhea.  He states he does not have any abdominal pain.  He is concerned about his sugars.  He reports he is a type II diabetic who is on Metformin and insulin.  He reports that he has been out of his insulin for several weeks.  He states that he tried to take a friend's medication but he does not think it is strong enough.  He notes he has also had some polydipsia and polyuria.  He has not noted any pain with urination or hematuria.  Patient states he has not any fevers, chills.  He has not had any known sick contacts.  He denies any recent travel, known COVID-19 exposure.  Denies any chest pain, difficulty breathing, urinary complaints.     The history is provided by the patient.  Emesis Associated symptoms: diarrhea   Associated symptoms: no abdominal pain, no cough, no fever and no headaches     Past Medical History:  Diagnosis Date  . Diabetes mellitus without complication ([place])   . GERD (gastroesophageal reflux disease)   . Tobacco abuse     Patient Active Problem List   Diagnosis Date Noted  . Tobacco dependence 07/31/2018  . Immunization due 01/27/2017  . Essential hypertension 01/27/2017  . Former cigar smoker 01/27/2017  . Obesity 03/14/2016  . Hyperlipidemia 03/14/2016  . Hyperlipidemia due to type 2 diabetes mellitus ([place]) 03/14/2016  . New onset type 2 diabetes mellitus ([place]) 03/11/2016  . GERD (gastroesophageal reflux disease) 03/11/2016    No past surgical history on file.      Home Medications     Prior to Admission medications   Medication Sig Start Date End Date Taking? Authorizing Provider  insulin NPH-regular Human (NOVOLIN 70/30) (70-30) 100 UNIT/ML injection Inject 15 Units into the skin daily with supper. 01/09/19   Volanda Napoleon, PA-C  lisinopril (ZESTRIL) 10 MG tablet Take 1 tablet (10 mg total) by mouth daily. 01/09/19   Volanda Napoleon, PA-C  lovastatin (MEVACOR) 40 MG tablet Take 1 tablet (40 mg total) by mouth at bedtime. 01/09/19   Volanda Napoleon, PA-C  metFORMIN (GLUCOPHAGE) 1000 MG tablet Take 1 tablet (1,000 mg total) by mouth 2 (two) times daily. 01/09/19   Volanda Napoleon, PA-C  omeprazole (PRILOSEC) 20 MG capsule Take 1 capsule (20 mg total) by mouth daily as needed (heartburn). 01/09/19   Volanda Napoleon, PA-C    Family History Family History  Problem Relation Age of Onset  . Diabetes type II Mother     Social History Social History   Tobacco Use  . Smoking status: Current Some Day Smoker  . Smokeless tobacco: Never Used  Substance Use Topics  . Alcohol use: Yes    Comment: occasional  . Drug use: No     Allergies   Bee venom   Review of Systems Review of Systems  Constitutional: Negative for fever.  Respiratory: Negative for cough and shortness of breath.   Cardiovascular: Negative for chest pain.  Gastrointestinal: Positive  for diarrhea, nausea and vomiting. Negative for abdominal pain.  Endocrine: Positive for polydipsia and polyuria.  Genitourinary: Negative for dysuria and hematuria.  Neurological: Negative for headaches.  All other systems reviewed and are negative.    Physical Exam Updated Vital Signs BP 128/89   Pulse 95   Temp 98.9 F (37.2 C) (Oral)   Resp 16   SpO2 95%   Physical Exam Vitals signs and nursing note reviewed.  Constitutional:      Appearance: Normal appearance. He is well-developed.  HENT:     Head: Normocephalic and atraumatic.  Eyes:     General: Lids are normal.      Conjunctiva/sclera: Conjunctivae normal.     Pupils: Pupils are equal, round, and reactive to light.  Neck:     Musculoskeletal: Full passive range of motion without pain.  Cardiovascular:     Rate and Rhythm: Normal rate and regular rhythm.     Pulses: Normal pulses.     Heart sounds: Normal heart sounds. No murmur. No friction rub. No gallop.   Pulmonary:     Effort: Pulmonary effort is normal.     Breath sounds: Normal breath sounds.     Comments: Lungs clear to auscultation bilaterally.  Symmetric chest rise.  No wheezing, rales, rhonchi. Abdominal:     Palpations: Abdomen is soft. Abdomen is not rigid.     Tenderness: There is no abdominal tenderness. There is no right CVA tenderness, left CVA tenderness or guarding.     Comments: Abdomen is soft, non-distended, non-tender. No rigidity, No guarding. No peritoneal signs.  No CVA tenderness bilaterally.  Musculoskeletal: Normal range of motion.  Skin:    General: Skin is warm and dry.     Capillary Refill: Capillary refill takes less than 2 seconds.  Neurological:     Mental Status: He is alert and oriented to person, place, and time.  Psychiatric:        Speech: Speech normal.      ED Treatments / Results  Labs (all labs ordered are listed, but only abnormal results are displayed) Labs Reviewed  COMPREHENSIVE METABOLIC PANEL - Abnormal; Notable for the following components:      Result Value   Sodium 134 (*)    Glucose, Bld 411 (*)    ALT 55 (*)    Total Bilirubin 3.0 (*)    All other components within normal limits  URINALYSIS, ROUTINE W REFLEX MICROSCOPIC - Abnormal; Notable for the following components:   Color, Urine STRAW (*)    Glucose, UA >=500 (*)    Ketones, ur 20 (*)    Bacteria, UA RARE (*)    All other components within normal limits  CBG MONITORING, ED - Abnormal; Notable for the following components:   Glucose-Capillary 398 (*)    All other components within normal limits  CBG MONITORING, ED -  Abnormal; Notable for the following components:   Glucose-Capillary 298 (*)    All other components within normal limits  LIPASE, BLOOD  CBC    EKG None  Radiology No results found.  Procedures Procedures (including critical care time)  Medications Ordered in ED Medications  sodium chloride flush (NS) 0.9 % injection 3 mL (3 mLs Intravenous Given 01/09/19 1227)  sodium chloride 0.9 % bolus 1,000 mL (0 mLs Intravenous Stopped 01/09/19 1334)  ondansetron (ZOFRAN) injection 4 mg (4 mg Intravenous Given 01/09/19 1407)  insulin aspart (novoLOG) injection 6 Units (6 Units Intravenous Given 01/09/19 1408)  insulin aspart (novoLOG)  injection 3 Units (3 Units Intravenous Given 01/09/19 1514)     Initial Impression / Assessment and Plan / ED Course  I have reviewed the triage vital signs and the nursing notes.  Pertinent labs & imaging results that were available during my care of the patient were reviewed by me and considered in my medical decision making (see chart for details).        38 year old male who presents for evaluation of nausea/vomiting and diarrhea that began today.  History of diabetes reports he has not been on his insulin for several weeks.  No abdominal pain, fevers.  No known COVID-19 exposure.  Initially arrival, he is afebrile.  He is slightly tachycardic and hypertensive.  Vitals otherwise stable.  Benign abdominal exam.  Will give fluids.  Concern for DKA versus infectious etiology.  Plan for labs, UA.   UA does show small amount of ketones.  No evidence of infectious etiology.  Blood glucose is 411, sodium is 134, bicarb is 22, AST and ALT unremarkable.  Anion gap is 14.  At this time, he does have a small amount of ketones in his urine his anion gap and bicarb are within normal limits.  Do not suspect that he is in acute DKA.  Give fluids, insulin and recheck his sugar.  Repeat CBG shows 298 after fluids and insulin.  He has been eating ice chips and water  without any vomiting.  Repeat abdominal exam benign.  Do not feel he needs any imaging.  Discussed with case manager who will help him get his medications.  I discussed with patient regarding the importance of taking his insulin as directed.  At this time, does not appear that he is in DKA.  Patient stable for discharge at this time. Patient had ample opportunity for questions and discussion. All patient's questions were answered with full understanding. Strict return precautions discussed. Patient expresses understanding and agreement to plan.   Portions of this note were generated with Scientist, clinical (histocompatibility and immunogenetics). Dictation errors may occur despite best attempts at proofreading.    Final Clinical Impressions(s) / ED Diagnoses   Final diagnoses:  Hyperglycemia    ED Discharge Orders         Ordered    insulin NPH-regular Human (NOVOLIN 70/30) (70-30) 100 UNIT/ML injection  Daily with supper     01/09/19 1509    lisinopril (ZESTRIL) 10 MG tablet  Daily     01/09/19 1509    lovastatin (MEVACOR) 40 MG tablet  Daily at bedtime     01/09/19 1509    metFORMIN (GLUCOPHAGE) 1000 MG tablet  2 times daily     01/09/19 1509    omeprazole (PRILOSEC) 20 MG capsule  Daily PRN     01/09/19 1509           Maxwell Caul, PA-C 01/09/19 1515    Curatolo, Adam, DO 01/09/19 1516

## 2019-01-09 NOTE — Discharge Instructions (Signed)
It is very important that you take your insulin.  Please follow-up with [facility] to get your medications.  Return emergency department for any worsening vomiting, abdominal pain or any other worsening or concerning symptoms.

## 2019-01-09 NOTE — ED Triage Notes (Signed)
Patient reports he has been having diarrhea and nausea and emesis. Patient has a hx of diabetes. Patient reports he has not checked his CBG.

## 2019-01-23 ENCOUNTER — Other Ambulatory Visit: Payer: Self-pay | Admitting: Internal Medicine

## 2019-01-23 DIAGNOSIS — E785 Hyperlipidemia, unspecified: Secondary | ICD-10-CM

## 2019-01-23 DIAGNOSIS — E1169 Type 2 diabetes mellitus with other specified complication: Secondary | ICD-10-CM

## 2019-01-23 DIAGNOSIS — E669 Obesity, unspecified: Secondary | ICD-10-CM

## 2019-01-31 ENCOUNTER — Encounter (HOSPITAL_COMMUNITY): Payer: Self-pay

## 2019-01-31 ENCOUNTER — Emergency Department (HOSPITAL_COMMUNITY)
Admission: EM | Admit: 2019-01-31 | Discharge: 2019-01-31 | Disposition: A | Discharge status: Home / Self Care | Payer: Self-pay | Attending: Emergency Medicine | Admitting: Emergency Medicine

## 2019-01-31 DIAGNOSIS — E1165 Type 2 diabetes mellitus with hyperglycemia: Secondary | ICD-10-CM | POA: Insufficient documentation

## 2019-01-31 DIAGNOSIS — R739 Hyperglycemia, unspecified: Secondary | ICD-10-CM

## 2019-01-31 DIAGNOSIS — I10 Essential (primary) hypertension: Secondary | ICD-10-CM | POA: Insufficient documentation

## 2019-01-31 DIAGNOSIS — Z794 Long term (current) use of insulin: Secondary | ICD-10-CM | POA: Insufficient documentation

## 2019-01-31 DIAGNOSIS — Z79899 Other long term (current) drug therapy: Secondary | ICD-10-CM | POA: Insufficient documentation

## 2019-01-31 DIAGNOSIS — F17200 Nicotine dependence, unspecified, uncomplicated: Secondary | ICD-10-CM | POA: Insufficient documentation

## 2019-01-31 LAB — BASIC METABOLIC PANEL
Anion gap: 14 (ref 5–15)
BUN: 15 mg/dL (ref 6–20)
CO2: 21 mmol/L — ABNORMAL LOW (ref 22–32)
Calcium: 8.9 mg/dL (ref 8.9–10.3)
Chloride: 95 mmol/L — ABNORMAL LOW (ref 98–111)
Creatinine, Ser: 1.13 mg/dL (ref 0.61–1.24)
GFR calc Af Amer: >60 mL/min (ref >60)
GFR calc non Af Amer: >60 mL/min (ref >60)
Glucose, Bld: 447 mg/dL — ABNORMAL HIGH (ref 70–99)
Potassium: 4.2 mmol/L (ref 3.5–5.1)
Sodium: 130 mmol/L — ABNORMAL LOW (ref 135–145)

## 2019-01-31 LAB — URINALYSIS, ROUTINE W REFLEX MICROSCOPIC
Bacteria, UA: NONE SEEN
Bilirubin Urine: NEGATIVE
Glucose, UA: >=500 mg/dL — AB
Hgb urine dipstick: NEGATIVE
Ketones, ur: 20 mg/dL — AB
Leukocytes,Ua: NEGATIVE
Nitrite: NEGATIVE
Protein, ur: NEGATIVE mg/dL
Specific Gravity, Urine: 1.021 (ref 1.005–1.030)
pH: 6 (ref 5.0–8.0)

## 2019-01-31 LAB — CBC
HCT: 39.6 % (ref 39.0–52.0)
Hemoglobin: 13.3 g/dL (ref 13.0–17.0)
MCH: 27.5 pg (ref 26.0–34.0)
MCHC: 33.6 g/dL (ref 30.0–36.0)
MCV: 82 fL (ref 80.0–100.0)
Platelets: 289 10*3/uL (ref 150–400)
RBC: 4.83 MIL/uL (ref 4.22–5.81)
RDW: 14.6 % (ref 11.5–15.5)
WBC: 7.5 10*3/uL (ref 4.0–10.5)
nRBC: 0 % (ref 0.0–0.2)

## 2019-01-31 LAB — CBG MONITORING, ED
Glucose-Capillary: 507 mg/dL (ref 70–99)
Glucose-Capillary: 386 mg/dL — ABNORMAL HIGH (ref 70–99)
Glucose-Capillary: 293 mg/dL — ABNORMAL HIGH (ref 70–99)

## 2019-01-31 MED ORDER — INSULIN ASPART 100 UNIT/ML ~~LOC~~ SOLN
8.0000 [IU] | Freq: Once | SUBCUTANEOUS | Status: AC
  Administered 2019-01-31: 8 [IU] via INTRAVENOUS
  Filled 2019-01-31: qty 0.08

## 2019-01-31 MED ORDER — SODIUM CHLORIDE 0.9 % IV BOLUS
1000.0000 mL | Freq: Once | INTRAVENOUS | Status: AC
  Administered 2019-01-31: 1000 mL via INTRAVENOUS

## 2019-01-31 NOTE — Progress Notes (Addendum)
Inpatient Diabetes Program Recommendations  AACE/ADA: New Consensus Statement on Inpatient Glycemic Control (2015)  Target Ranges:  Prepandial:   less than 140 mg/dL      Peak postprandial:   less than 180 mg/dL (1-2 hours)      Critically ill patients:  140 - 180 mg/dL   Lab Results  Component Value Date   GLUCAP 386 (H) 01/31/2019   HGBA1C 5.9 01/27/2017    Review of Glycemic Control  Diabetes history: DM 2 Outpatient Diabetes medications: 70/30 15 units Daily at supper, Metformin 1000 mg Daily Current orders for Inpatient glycemic control: Being evaluated in ED  Inpatient Diabetes Program Recommendations:    Note home regimen 70/30 insulin dose only lasts for 12 hours. This insulin is made to be taken bid. Also last ED visit patient metformin was increased to bid but his home med rec still shows Daily dosing.  Pt got assistance from CM last time in the ED and was set up with a [facility] and follow up at [facility] for further insulin and PCP needs (assuming he has or will establish care).  May consider increasing his 70/30 dose to BID dosing with frequent CBG checks and follow up at [facility] if appropriate at time of discharge from ED or hospital.   Thanks,  Tama Headings RN, MSN, BC-ADM Inpatient Diabetes Coordinator Team Pager [phone] (8a-5p)

## 2019-01-31 NOTE — ED Triage Notes (Signed)
Pt presents with c/o hyperglycemia. Pt reports he checked his CBG at home and it was 530. Pt reports he feels sluggish. Pt alert and oriented, able to answer all questions.

## 2019-02-04 NOTE — ED Provider Notes (Signed)
[name] DEPT Provider Note   CSN: [id number] Arrival date & time: 01/31/19  7353     History   Chief Complaint Chief Complaint  Patient presents with  . Hyperglycemia    HPI William Martin is a 38 y.o. male.     HPI  29-year male with hyperglycemia.  Reports that his blood sugar at home was 530.  He feels generally fatigued/sluggish.  Mild nausea.  No vomiting.  No abdominal pain.  No acute visual changes.  Some polydipsia.  No polyuria.  No fevers or chills.  He reports ports compliance with his medications.  Past Medical History:  Diagnosis Date  . Diabetes mellitus without complication ([place])   . GERD (gastroesophageal reflux disease)   . Tobacco abuse     Patient Active Problem List   Diagnosis Date Noted  . Tobacco dependence 07/31/2018  . Immunization due 01/27/2017  . Essential hypertension 01/27/2017  . Former cigar smoker 01/27/2017  . Obesity 03/14/2016  . Hyperlipidemia 03/14/2016  . Hyperlipidemia due to type 2 diabetes mellitus ([place]) 03/14/2016  . New onset type 2 diabetes mellitus ([place]) 03/11/2016  . GERD (gastroesophageal reflux disease) 03/11/2016    History reviewed. No pertinent surgical history.      Home Medications    Prior to Admission medications   Medication Sig Start Date End Date Taking? Authorizing Provider  insulin NPH-regular Human (NOVOLIN 70/30) (70-30) 100 UNIT/ML injection Inject 15 Units into the skin daily with supper. 01/09/19  Yes Providence Lanius A, PA-C  lisinopril (ZESTRIL) 10 MG tablet Take 1 tablet (10 mg total) by mouth daily. 01/09/19  Yes Volanda Napoleon, PA-C  lovastatin (MEVACOR) 40 MG tablet Take 1 tablet (40 mg total) by mouth at bedtime. 01/09/19  Yes Volanda Napoleon, PA-C  metFORMIN (GLUCOPHAGE) 1000 MG tablet Take 1 tablet (1,000 mg total) by mouth 2 (two) times daily. Patient taking differently: Take 1,000 mg by mouth daily with breakfast.  01/09/19  Yes Volanda Napoleon,  PA-C  omeprazole (PRILOSEC) 20 MG capsule Take 1 capsule (20 mg total) by mouth daily as needed (heartburn). 01/09/19  Yes Volanda Napoleon, PA-C    Family History Family History  Problem Relation Age of Onset  . Diabetes type II Mother     Social History Social History   Tobacco Use  . Smoking status: Current Some Day Smoker  . Smokeless tobacco: Never Used  Substance Use Topics  . Alcohol use: Yes    Comment: occasional  . Drug use: No     Allergies   Bee venom   Review of Systems Review of Systems  All systems reviewed and negative, other than as noted in HPI.  Physical Exam Updated Vital Signs BP 115/65   Pulse 76   Temp 97.9 F (36.6 C) (Oral)   Resp 16   Ht 5\' 10"  (1.778 m)   Wt 117.9 kg   SpO2 99%   BMI 37.31 kg/m   Physical Exam Vitals signs and nursing note reviewed.  Constitutional:      General: He is not in acute distress.    Appearance: He is well-developed.  HENT:     Head: Normocephalic and atraumatic.  Eyes:     General:        Right eye: No discharge.        Left eye: No discharge.     Conjunctiva/sclera: Conjunctivae normal.  Neck:     Musculoskeletal: Neck supple.  Cardiovascular:  Rate and Rhythm: Normal rate and regular rhythm.     Heart sounds: Normal heart sounds. No murmur. No friction rub. No gallop.   Pulmonary:     Effort: Pulmonary effort is normal. No respiratory distress.     Breath sounds: Normal breath sounds.  Abdominal:     General: There is no distension.     Palpations: Abdomen is soft.     Tenderness: There is no abdominal tenderness.  Musculoskeletal:        General: No tenderness.  Skin:    General: Skin is warm and dry.  Neurological:     Mental Status: He is alert.  Psychiatric:        Behavior: Behavior normal.        Thought Content: Thought content normal.      ED Treatments / Results  Labs (all labs ordered are listed, but only abnormal results are displayed) Labs Reviewed  BASIC  METABOLIC PANEL - Abnormal; Notable for the following components:      Result Value   Sodium 130 (*)    Chloride 95 (*)    CO2 21 (*)    Glucose, Bld 447 (*)    All other components within normal limits  URINALYSIS, ROUTINE W REFLEX MICROSCOPIC - Abnormal; Notable for the following components:   Color, Urine STRAW (*)    Glucose, UA >=500 (*)    Ketones, ur 20 (*)    All other components within normal limits  CBG MONITORING, ED - Abnormal; Notable for the following components:   Glucose-Capillary 507 (*)    All other components within normal limits  CBG MONITORING, ED - Abnormal; Notable for the following components:   Glucose-Capillary 386 (*)    All other components within normal limits  CBG MONITORING, ED - Abnormal; Notable for the following components:   Glucose-Capillary 293 (*)    All other components within normal limits  CBC    EKG None  Radiology No results found.  Procedures Procedures (including critical care time)  Medications Ordered in ED Medications  sodium chloride 0.9 % bolus 1,000 mL (0 mLs Intravenous Stopped 01/31/19 0908)  sodium chloride 0.9 % bolus 1,000 mL (0 mLs Intravenous Stopped 01/31/19 1118)  insulin aspart (novoLOG) injection 8 Units (8 Units Intravenous Given 01/31/19 0908)     Initial Impression / Assessment and Plan / ED Course  I have reviewed the triage vital signs and the nursing notes.  Pertinent labs & imaging results that were available during my care of the patient were reviewed by me and considered in my medical decision making (see chart for details).        38 year old male with hyperglycemia without metabolic acidosis.  Treated with improvement.  He report reports compliance with his medications.  Advised that he needs to follow-up with his prescribing doctor to discuss further if he has persistent hyperglycemia.  Final Clinical Impressions(s) / ED Diagnoses   Final diagnoses:  Hyperglycemia    ED Discharge Orders     None       Raeford Razor, MD 02/04/19 1248

## 2019-02-05 ENCOUNTER — Emergency Department (HOSPITAL_COMMUNITY)
Admission: EM | Admit: 2019-02-05 | Discharge: 2019-02-05 | Disposition: A | Discharge status: Home / Self Care | Payer: Self-pay | Attending: Emergency Medicine | Admitting: Emergency Medicine

## 2019-02-05 ENCOUNTER — Encounter (HOSPITAL_COMMUNITY): Payer: Self-pay | Admitting: Emergency Medicine

## 2019-02-05 ENCOUNTER — Other Ambulatory Visit: Payer: Self-pay

## 2019-02-05 DIAGNOSIS — R739 Hyperglycemia, unspecified: Secondary | ICD-10-CM

## 2019-02-05 DIAGNOSIS — Z794 Long term (current) use of insulin: Secondary | ICD-10-CM | POA: Insufficient documentation

## 2019-02-05 DIAGNOSIS — F17210 Nicotine dependence, cigarettes, uncomplicated: Secondary | ICD-10-CM | POA: Insufficient documentation

## 2019-02-05 DIAGNOSIS — Z79899 Other long term (current) drug therapy: Secondary | ICD-10-CM | POA: Insufficient documentation

## 2019-02-05 DIAGNOSIS — I10 Essential (primary) hypertension: Secondary | ICD-10-CM | POA: Insufficient documentation

## 2019-02-05 DIAGNOSIS — E1165 Type 2 diabetes mellitus with hyperglycemia: Secondary | ICD-10-CM | POA: Insufficient documentation

## 2019-02-05 LAB — BASIC METABOLIC PANEL
Anion gap: 14 (ref 5–15)
BUN: 16 mg/dL (ref 6–20)
CO2: 21 mmol/L — ABNORMAL LOW (ref 22–32)
Calcium: 9.3 mg/dL (ref 8.9–10.3)
Chloride: 96 mmol/L — ABNORMAL LOW (ref 98–111)
Creatinine, Ser: 1.19 mg/dL (ref 0.61–1.24)
GFR calc Af Amer: >60 mL/min (ref >60)
GFR calc non Af Amer: >60 mL/min (ref >60)
Glucose, Bld: 668 mg/dL (ref 70–99)
Potassium: 4.9 mmol/L (ref 3.5–5.1)
Sodium: 131 mmol/L — ABNORMAL LOW (ref 135–145)

## 2019-02-05 LAB — CBG MONITORING, ED
Glucose-Capillary: >600 mg/dL (ref 70–99)
Glucose-Capillary: 404 mg/dL — ABNORMAL HIGH (ref 70–99)

## 2019-02-05 LAB — URINALYSIS, ROUTINE W REFLEX MICROSCOPIC
Bacteria, UA: NONE SEEN
Bilirubin Urine: NEGATIVE
Glucose, UA: >=500 mg/dL — AB
Hgb urine dipstick: NEGATIVE
Ketones, ur: 5 mg/dL — AB
Leukocytes,Ua: NEGATIVE
Nitrite: NEGATIVE
Protein, ur: NEGATIVE mg/dL
Specific Gravity, Urine: 1.022 (ref 1.005–1.030)
pH: 6 (ref 5.0–8.0)

## 2019-02-05 LAB — CBC
HCT: 40.9 % (ref 39.0–52.0)
Hemoglobin: 13.7 g/dL (ref 13.0–17.0)
MCH: 28 pg (ref 26.0–34.0)
MCHC: 33.5 g/dL (ref 30.0–36.0)
MCV: 83.6 fL (ref 80.0–100.0)
Platelets: 303 10*3/uL (ref 150–400)
RBC: 4.89 MIL/uL (ref 4.22–5.81)
RDW: 15.1 % (ref 11.5–15.5)
WBC: 7.8 10*3/uL (ref 4.0–10.5)
nRBC: 0 % (ref 0.0–0.2)

## 2019-02-05 LAB — BLOOD GAS, VENOUS
Acid-base deficit: 0.1 mmol/L (ref 0.0–2.0)
Bicarbonate: 25 mmol/L (ref 20.0–28.0)
O2 Saturation: 93.2 %
Patient temperature: 98.6
pCO2, Ven: 44.7 mmHg (ref 44.0–60.0)
pH, Ven: 7.366 (ref 7.250–7.430)
pO2, Ven: 73.1 mmHg — ABNORMAL HIGH (ref 32.0–45.0)

## 2019-02-05 MED ORDER — SODIUM CHLORIDE 0.9 % IV BOLUS
2000.0000 mL | Freq: Once | INTRAVENOUS | Status: AC
  Administered 2019-02-05: 1000 mL via INTRAVENOUS

## 2019-02-05 MED ORDER — INSULIN ASPART 100 UNIT/ML ~~LOC~~ SOLN
10.0000 [IU] | Freq: Once | SUBCUTANEOUS | Status: AC
  Administered 2019-02-05: 15:00:00 10 [IU] via SUBCUTANEOUS
  Filled 2019-02-05: qty 0.1

## 2019-02-05 MED ORDER — SODIUM CHLORIDE 0.9 % IV SOLN: INTRAVENOUS | Status: DC

## 2019-02-05 NOTE — ED Provider Notes (Signed)
[facility] DEPT Provider Note   CSN: [id number] Arrival date & time: 02/05/19  1207     History   Chief Complaint Chief Complaint  Patient presents with  . Hyperglycemia    HPI Man Reaser is a 38 y.o. male.     The history is provided by the patient.  Hyperglycemia Blood sugar level PTA:  >500 Severity:  Moderate Onset quality:  Gradual Timing:  Constant Progression:  Unchanged Chronicity:  Recurrent Diabetes status:  Controlled with insulin Context: recent change in diet (still drinking a lot of sugar drinks)   Context: not noncompliance   Relieved by:  Nothing Associated symptoms: dehydration, increased thirst, malaise and polyuria   Associated symptoms: no abdominal pain, no altered mental status, no blurred vision, no chest pain, no confusion, no dizziness, no dysuria, no fever, no shortness of breath, no vomiting and no weakness   Risk factors: no hx of DKA     Past Medical History:  Diagnosis Date  . Diabetes mellitus without complication ([place])   . GERD (gastroesophageal reflux disease)   . Tobacco abuse     Patient Active Problem List   Diagnosis Date Noted  . Tobacco dependence 07/31/2018  . Immunization due 01/27/2017  . Essential hypertension 01/27/2017  . Former cigar smoker 01/27/2017  . Obesity 03/14/2016  . Hyperlipidemia 03/14/2016  . Hyperlipidemia due to type 2 diabetes mellitus ([place]) 03/14/2016  . New onset type 2 diabetes mellitus ([place]) 03/11/2016  . GERD (gastroesophageal reflux disease) 03/11/2016    History reviewed. No pertinent surgical history.      Home Medications    Prior to Admission medications   Medication Sig Start Date End Date Taking? Authorizing Provider  insulin NPH-regular Human (NOVOLIN 70/30) (70-30) 100 UNIT/ML injection Inject 15 Units into the skin daily with supper. 01/09/19   Volanda Napoleon, PA-C  lisinopril (ZESTRIL) 10 MG tablet Take 1 tablet (10 mg total) by mouth  daily. 01/09/19   Volanda Napoleon, PA-C  lovastatin (MEVACOR) 40 MG tablet Take 1 tablet (40 mg total) by mouth at bedtime. 01/09/19   Volanda Napoleon, PA-C  metFORMIN (GLUCOPHAGE) 1000 MG tablet Take 1 tablet (1,000 mg total) by mouth 2 (two) times daily. Patient taking differently: Take 1,000 mg by mouth daily with breakfast.  01/09/19   Volanda Napoleon, PA-C  omeprazole (PRILOSEC) 20 MG capsule Take 1 capsule (20 mg total) by mouth daily as needed (heartburn). 01/09/19   Volanda Napoleon, PA-C    Family History Family History  Problem Relation Age of Onset  . Diabetes type II Mother     Social History Social History   Tobacco Use  . Smoking status: Current Some Day Smoker  . Smokeless tobacco: Never Used  Substance Use Topics  . Alcohol use: Yes    Comment: occasional  . Drug use: No     Allergies   Bee venom   Review of Systems Review of Systems  Constitutional: Negative for chills and fever.  HENT: Negative for ear pain and sore throat.   Eyes: Negative for blurred vision, pain and visual disturbance.  Respiratory: Negative for cough and shortness of breath.   Cardiovascular: Negative for chest pain and palpitations.  Gastrointestinal: Negative for abdominal pain and vomiting.  Endocrine: Positive for polydipsia and polyuria.  Genitourinary: Negative for dysuria and hematuria.  Musculoskeletal: Negative for arthralgias and back pain.  Skin: Negative for color change and rash.  Neurological: Negative for dizziness, seizures, syncope and  weakness.  Psychiatric/Behavioral: Negative for confusion.  All other systems reviewed and are negative.    Physical Exam Updated Vital Signs BP 127/89 (BP Location: Left Arm)   Pulse (!) 108   Temp 98.4 F (36.9 C) (Oral)   Resp 16   SpO2 99%   Physical Exam Vitals signs and nursing note reviewed.  Constitutional:      Appearance: He is well-developed.  HENT:     Head: Normocephalic and atraumatic.      Mouth/Throat:     Mouth: Mucous membranes are dry.  Eyes:     Extraocular Movements: Extraocular movements intact.     Conjunctiva/sclera: Conjunctivae normal.  Neck:     Musculoskeletal: Normal range of motion and neck supple.  Cardiovascular:     Rate and Rhythm: Regular rhythm. Tachycardia present.     Pulses: Normal pulses.     Heart sounds: Normal heart sounds. No murmur.  Pulmonary:     Effort: Pulmonary effort is normal. No respiratory distress.     Breath sounds: Normal breath sounds.  Abdominal:     Palpations: Abdomen is soft.     Tenderness: There is no abdominal tenderness.  Musculoskeletal: Normal range of motion.     Right lower leg: No edema.     Left lower leg: No edema.  Skin:    General: Skin is warm and dry.     Capillary Refill: Capillary refill takes less than 2 seconds.  Neurological:     General: No focal deficit present.     Mental Status: He is alert.      ED Treatments / Results  Labs (all labs ordered are listed, but only abnormal results are displayed) Labs Reviewed  BASIC METABOLIC PANEL - Abnormal; Notable for the following components:      Result Value   Sodium 131 (*)    Chloride 96 (*)    CO2 21 (*)    Glucose, Bld 668 (*)    All other components within normal limits  CBG MONITORING, ED - Abnormal; Notable for the following components:   Glucose-Capillary >600 (*)    All other components within normal limits  CBC  URINALYSIS, ROUTINE W REFLEX MICROSCOPIC  BLOOD GAS, VENOUS  CBG MONITORING, ED    EKG None  Radiology No results found.  Procedures .Critical Care Performed by: Virgina Norfolkuratolo, Adelbert Gaspard, DO Authorized by: Virgina Norfolkuratolo, Ronella Plunk, DO   Critical care provider statement:    Critical care time (minutes):  35   Critical care was necessary to treat or prevent imminent or life-threatening deterioration of the following conditions:  Dehydration and metabolic crisis   Critical care was time spent personally by me on the following  activities:  Development of treatment plan with patient or surrogate, examination of patient, obtaining history from patient or surrogate, ordering and performing treatments and interventions, ordering and review of laboratory studies, ordering and review of radiographic studies, pulse oximetry, re-evaluation of patient's condition and review of old charts   I assumed direction of critical care for this patient from another provider in my specialty: no     (including critical care time)  Medications Ordered in ED Medications  0.9 %  sodium chloride infusion (has no administration in time range)  insulin aspart (novoLOG) injection 10 Units (has no administration in time range)  sodium chloride 0.9 % bolus 2,000 mL (1,000 mLs Intravenous New Bag/Given 02/05/19 1445)     Initial Impression / Assessment and Plan / ED Course  I have reviewed the  triage vital signs and the nursing notes.  Pertinent labs & imaging results that were available during my care of the patient were reviewed by me and considered in my medical decision making (see chart for details).     William Martin is a 38 year old male with history of diabetes, hypertension who presents to the ED with hyperglycemia.  Patient states that blood sugars greater than 500 at home.  Has history of the same.  Has been compliant with both his insulin and Metformin.  Follows up with community wellness.  Blood sugar upon arrival here is 668.  However there does not appear to be any signs of DKA.  Bicarb is 21.  Creatinine is normal.  Will give 2 L of normal saline and give 10 units of IV insulin.  Will reevaluate and anticipate discharge home.  I have had prolonged discussion with the patient about dietary modifications.  Have put in an ambulatory referral to endocrinology.  Patient is mildly tachycardic likely from volume depletion.  Will replete with 2 L of fluids and anticipate discharge to home.  Patient was signed out to oncoming ED staff with  patient pending hydration, insulin, repeat CBG.  Patient understands dietary modifications and follow-up for endocrinology.  Recommend that he continue checking his blood sugar at least 2 times a day so that his primary care doctor or endocrinologist can make medication adjustments.  This chart was dictated using voice recognition software.  Despite best efforts to proofread,  errors can occur which can change the documentation meaning.    Final Clinical Impressions(s) / ED Diagnoses   Final diagnoses:  Hyperglycemia    ED Discharge Orders         Ordered    Ambulatory referral to Endocrinology     02/05/19 [address], [place] 02/05/19 1445

## 2019-02-05 NOTE — ED Notes (Signed)
ED Provider at bedside. 

## 2019-02-05 NOTE — ED Notes (Signed)
Date and time results received: 02/05/19 1:07 PM  Test: Glucose Critical Value: 668  Name of Provider Notified: Zenia Resides  Orders Received? Or Actions Taken?:

## 2019-02-05 NOTE — ED Provider Notes (Signed)
Pt is feeling well.  Ready to go home.  Blood sugar has decreased, 400.  Discussed importance of dietary compliance with patient again.  Stable for discharge.   Dorie Rank, MD 02/05/19 [id number]

## 2019-02-05 NOTE — Progress Notes (Signed)
Referral TOC   TOC CM spoke to pt about his medications and follow up with PCP. States he has made many changes to his diet but feels like he may need more education. Pt has an appt on 03/04/2019, takes he plans to keep his appt. He has all his medications, and gets from [facility]. Referral sent to Diabetes Coordinator. [place], [place] ED TOC CM [phone]

## 2019-02-05 NOTE — Discharge Instructions (Signed)
Endocrinology will call for follow-up appointment.  Please follow-up with your primary care doctor for further adjustments in medications.  Continue to track your blood sugars at home.  Make dietary changes as discussed.

## 2019-02-05 NOTE — ED Triage Notes (Signed)
Patient reports ongoing hyperglycemia. Seen last week x for same. Reports taking insulin and metformin as prescribed. CBG reading HI in triage.

## 2019-03-04 ENCOUNTER — Ambulatory Visit: Payer: Self-pay | Attending: Internal Medicine | Admitting: Internal Medicine

## 2019-03-04 ENCOUNTER — Encounter: Payer: Self-pay | Admitting: Internal Medicine

## 2019-03-04 ENCOUNTER — Other Ambulatory Visit: Payer: Self-pay

## 2019-03-04 VITALS — BP 135/97 | HR 86 | Temp 99.7°F | Resp 16 | Wt 262.8 lb

## 2019-03-04 DIAGNOSIS — E785 Hyperlipidemia, unspecified: Secondary | ICD-10-CM

## 2019-03-04 DIAGNOSIS — F17200 Nicotine dependence, unspecified, uncomplicated: Secondary | ICD-10-CM

## 2019-03-04 DIAGNOSIS — Z23 Encounter for immunization: Secondary | ICD-10-CM

## 2019-03-04 DIAGNOSIS — R945 Abnormal results of liver function studies: Secondary | ICD-10-CM

## 2019-03-04 DIAGNOSIS — E1169 Type 2 diabetes mellitus with other specified complication: Secondary | ICD-10-CM | POA: Insufficient documentation

## 2019-03-04 DIAGNOSIS — I10 Essential (primary) hypertension: Secondary | ICD-10-CM

## 2019-03-04 DIAGNOSIS — Z114 Encounter for screening for human immunodeficiency virus [HIV]: Secondary | ICD-10-CM

## 2019-03-04 DIAGNOSIS — R7989 Other specified abnormal findings of blood chemistry: Secondary | ICD-10-CM

## 2019-03-04 DIAGNOSIS — E669 Obesity, unspecified: Secondary | ICD-10-CM

## 2019-03-04 LAB — POCT GLYCOSYLATED HEMOGLOBIN (HGB A1C): HbA1c, POC (controlled diabetic range): 12.1 % — AB (ref 0.0–7.0)

## 2019-03-04 LAB — GLUCOSE, POCT (MANUAL RESULT ENTRY): POC Glucose: 307 mg/dl — AB (ref 70–99)

## 2019-03-04 MED ORDER — PEN NEEDLES 31G X 8 MM MISC: 6 refills | Status: DC

## 2019-03-04 MED ORDER — LOVASTATIN 40 MG PO TABS: 40.0000 mg | ORAL_TABLET | Freq: Every day | ORAL | 6 refills | Status: DC

## 2019-03-04 MED ORDER — METFORMIN HCL 1000 MG PO TABS: 1000.0000 mg | ORAL_TABLET | Freq: Two times a day (BID) | ORAL | 6 refills | Status: DC

## 2019-03-04 MED ORDER — LISINOPRIL 10 MG PO TABS: 10.0000 mg | ORAL_TABLET | Freq: Every day | ORAL | 3 refills | Status: DC

## 2019-03-04 MED ORDER — NOVOLIN 70/30 (70-30) 100 UNIT/ML ~~LOC~~ SUSP: 15.0000 [IU] | Freq: Two times a day (BID) | SUBCUTANEOUS | 1 refills | Status: DC

## 2019-03-04 MED ORDER — LANTUS SOLOSTAR 100 UNIT/ML ~~LOC~~ SOPN: 20.0000 [IU] | PEN_INJECTOR | Freq: Every day | SUBCUTANEOUS | 11 refills | Status: DC

## 2019-03-04 MED FILL — NOVOLIN 70/30 100 UNITS/ML: (70-30) 100 | 33 days supply | Qty: 10 | Fill #0

## 2019-03-04 MED FILL — metFORMIN HCL 1000 MG TABS: 1000 | 30 days supply | Qty: 60 | Fill #0

## 2019-03-04 MED FILL — !LANTUS SOLOSTAR 100UNITS/M: 100 | 30 days supply | Qty: 6 | Fill #0

## 2019-03-04 MED FILL — LISINOPRIL 10 MG TABS: 10 | 30 days supply | Qty: 30 | Fill #0

## 2019-03-04 MED FILL — LOVASTATIN 40 MG TABS: 40 | 30 days supply | Qty: 30 | Fill #0

## 2019-03-04 NOTE — Progress Notes (Signed)
Patient ID: William Martin, male    DOB: [birth year]  MRN: [id number]  CC: Hospitalization Follow-up (ED)   Subjective: William Martin is a 38 y.o. male who presents for f/u from ER and chronic ds management. His concerns today include:  Patient with history ofDM, HTN,HL,obesity, tobacco dependence (cigars).  Patient was last evaluated by me 07/2018.  DIABETES TYPE 2 Last A1C:   Results for orders placed or performed in visit on 03/04/19  Glucose (CBG)  Result Value Ref Range   POC Glucose 307 (A) 70 - 99 mg/dl  HgB A1c  Result Value Ref Range   Hemoglobin A1C     HbA1c POC (<> result, manual entry)     HbA1c, POC (prediabetic range)     HbA1c, POC (controlled diabetic range) 12.1 (A) 0.0 - 7.0 %  Patient seen in the emergency room 02/05/2019 with hyperglycemia with reports of blood sugars at home being in the 500s.  He reported compliance with medications.  Blood sugar was 668.  He did not have anion gap.  Given IV fluid and insulin.  By the time of discharge blood sugar was 400.  They did not make any changes to his insulin regimen or added any new medication.  Med Adherence:  [x]  Yes.  Ran out of Novolin 70/30 insulin 4 days ago.  He was taking 15 units daily and Metformin 1000 mg once a day.  He was supposed to be on Metformin at 1000 mg twice a day.  He tells me that he started taking it twice a day after he ran out of his insulin 4 days ago.      []  No Medication side effects:  []  Yes    [x]  No Home Monitoring?  [x]  Yes - before meals QOD   []  No Home glucose results range: high 300s Diet Adherence: "getting better - more greens, less carbs and smaller porions.  Given up sweet tea altogether."  Drinks water and die sodas Exercise: not as much.  Just started a new job.  Trying to figure out how to implement exercise into it.  Hypoglycemic episodes?: []  Yes    [x]  No Numbness of the feet? []  Yes    [x]  No Retinopathy hx? []  Yes    [x]  No Last eye exam:  Comments:    HYPERTENSION Currently taking: see medication list Med Adherence: [x]  Yes - but ou of Lisinopril x 2 days   []  No Medication side effects: []  Yes    [x]  No Adherence with salt restriction: [x]  Yes    []  No Home Monitoring?: []  Yes    [x]  No Monitoring Frequency: []  Yes    []  No Home BP results range: []  Yes    []  No SOB? []  Yes    [x]  No Chest Pain?: []  Yes    [x]  No Leg swelling?: []  Yes    [x]  No Headaches?: []  Yes    [x]  No Dizziness? []  Yes    [x]  No Comments:   HL;  Out of Lovastain x 2 days.  Still has RF on current rxn.  He had mild elevation in ALT on liver studies done a few months ago.  Tells me that he quit drinking alcoholic beverages about 3 months ago.  Tob Dep: He has not smoked in 3 months.  However he tells me that every now and again he still gets the desire to smoke a cigar. He also tells me that he quit drinking alcoholic beverages 3 months  ago.  Patient Active Problem List   Diagnosis Date Noted  . Tobacco dependence 07/31/2018  . Immunization due 01/27/2017  . Essential hypertension 01/27/2017  . Former cigar smoker 01/27/2017  . Obesity 03/14/2016  . Hyperlipidemia 03/14/2016  . Hyperlipidemia due to type 2 diabetes mellitus ([place]) 03/14/2016  . New onset type 2 diabetes mellitus ([place]) 03/11/2016  . GERD (gastroesophageal reflux disease) 03/11/2016     Current Outpatient Medications on File Prior to Visit  Medication Sig Dispense Refill  . omeprazole (PRILOSEC) 20 MG capsule Take 1 capsule (20 mg total) by mouth daily as needed (heartburn). 30 capsule 0   No current facility-administered medications on file prior to visit.     Allergies  Allergen Reactions  . Bee Venom Swelling    Social History   Socioeconomic History  . Marital status: Single    Spouse name: Not on file  . Number of children: Not on file  . Years of education: Not on file  . Highest education level: Not on file  Occupational History  . Not on file  Social Needs  .  Financial resource strain: Not on file  . Food insecurity    Worry: Not on file    Inability: Not on file  . Transportation needs    Medical: Not on file    Non-medical: Not on file  Tobacco Use  . Smoking status: Current Some Day Smoker  . Smokeless tobacco: Never Used  Substance and Sexual Activity  . Alcohol use: Yes    Comment: occasional  . Drug use: No  . Sexual activity: Not on file  Lifestyle  . Physical activity    Days per week: Not on file    Minutes per session: Not on file  . Stress: Not on file  Relationships  . Social Herbalist on phone: Not on file    Gets together: Not on file    Attends religious service: Not on file    Active member of club or organization: Not on file    Attends meetings of clubs or organizations: Not on file    Relationship status: Not on file  . Intimate partner violence    Fear of current or ex partner: Not on file    Emotionally abused: Not on file    Physically abused: Not on file    Forced sexual activity: Not on file  Other Topics Concern  . Not on file  Social History Narrative  . Not on file    Family History  Problem Relation Age of Onset  . Diabetes type II Mother     No past surgical history on file.  ROS: Review of Systems Negative except as stated above  PHYSICAL EXAM: BP (!) 135/97   Pulse 86   Temp 99.7 F (37.6 C) (Oral)   Resp 16   Wt 262 lb 12.8 oz (119.2 kg)   SpO2 96%   BMI 37.71 kg/m   Wt Readings from Last 3 Encounters:  03/04/19 262 lb 12.8 oz (119.2 kg)  01/31/19 260 lb (117.9 kg)  02/17/18 274 lb 6.4 oz (124.5 kg)    Physical Exam General appearance - alert, well appearing, young to middle-aged African-American male and in no distress Mental status - normal mood, behavior, speech, dress, motor activity, and thought processes Eyes - pupils equal and reactive, extraocular eye movements intact Mouth - mucous membranes moist, pharynx normal without lesions Neck - supple, no  significant adenopathy Chest - clear  to auscultation, no wheezes, rales or rhonchi, symmetric air entry Heart - normal rate, regular rhythm, normal S1, S2, no murmurs, rubs, clicks or gallops Extremities - peripheral pulses normal, no pedal edema, no clubbing or cyanosis Diabetic Foot Exam - Simple   Simple Foot Form Visual Inspection No deformities, no ulcerations, no other skin breakdown bilaterally: Yes Sensation Testing Intact to touch and monofilament testing bilaterally: Yes Pulse Check Posterior Tibialis and Dorsalis pulse intact bilaterally: Yes Comments     CMP Latest Ref Rng & Units 02/05/2019 01/31/2019 01/09/2019  Glucose 70 - 99 mg/dL 668(HH) 447(H) 411(H)  BUN 6 - 20 mg/dL 16 15 16   Creatinine 0.61 - 1.24 mg/dL 1.19 1.13 1.18  Sodium 135 - 145 mmol/L 131(L) 130(L) 134(L)  Potassium 3.5 - 5.1 mmol/L 4.9 4.2 4.1  Chloride 98 - 111 mmol/L 96(L) 95(L) 98  CO2 22 - 32 mmol/L 21(L) 21(L) 22  Calcium 8.9 - 10.3 mg/dL 9.3 8.9 9.3  Total Protein 6.5 - 8.1 g/dL - - 8.1  Total Bilirubin 0.3 - 1.2 mg/dL - - 3.0(H)  Alkaline Phos 38 - 126 U/L - - 65  AST 15 - 41 U/L - - 27  ALT 0 - 44 U/L - - 55(H)   Lipid Panel     Component Value Date/Time   CHOL 245 (H) 01/27/2017 1446   TRIG 196 (H) 01/27/2017 1446   HDL 33 (L) 01/27/2017 1446   CHOLHDL 7.4 (H) 01/27/2017 1446   CHOLHDL 9.7 03/12/2016 0557   VLDL UNABLE TO CALCULATE IF TRIGLYCERIDE OVER 400 mg/dL 03/12/2016 0557   LDLCALC 173 (H) 01/27/2017 1446    CBC    Component Value Date/Time   WBC 7.8 02/05/2019 1227   RBC 4.89 02/05/2019 1227   HGB 13.7 02/05/2019 1227   HCT 40.9 02/05/2019 1227   PLT 303 02/05/2019 1227   MCV 83.6 02/05/2019 1227   MCH 28.0 02/05/2019 1227   MCHC 33.5 02/05/2019 1227   RDW 15.1 02/05/2019 1227    ASSESSMENT AND PLAN: 1. Diabetes mellitus type 2 in obese (HCC) Uncontrolled. Dietary counseling given.  Encouraged him to get in about 150 minutes total per week of moderate intensity  exercise. We discussed changing Novolin 70/30 to Lantus insulin 20 units daily.  However our clinical pharmacist is not here today to do teaching on Lantus pen.  We will schedule him to come back later this week for teaching.  In the meantime I have advised that he continue the Novolin 70/30 but increase it to 15 units twice a day.  Increase Metformin to 1000 mg twice a day.  Advised to check blood sugars at least twice a day before meals and bring in those readings on follow-up visit in 1 month. - Microalbumin / creatinine urine ratio - Glucose (CBG) - HgB A1c - insulin NPH-regular Human (NOVOLIN 70/30) (70-30) 100 UNIT/ML injection; Inject 15 Units into the skin 2 (two) times daily with a meal. Until patient able to met with pharmacist to make change to Lantus.  Dispense: 10 mL; Refill: 1 - metFORMIN (GLUCOPHAGE) 1000 MG tablet; Take 1 tablet (1,000 mg total) by mouth 2 (two) times daily.  Dispense: 60 tablet; Refill: 6 - Insulin Glargine (LANTUS SOLOSTAR) 100 UNIT/ML Solostar Pen; Inject 20 Units into the skin daily.  Dispense: 15 mL; Refill: 11 - Insulin Pen Needle (PEN NEEDLES) 31G X 8 MM MISC; UAD  Dispense: 100 each; Refill: 6  2. Hyperlipidemia associated with type 2 diabetes mellitus (HCC) - lovastatin (  MEVACOR) 40 MG tablet; Take 1 tablet (40 mg total) by mouth at bedtime.  Dispense: 30 tablet; Refill: 6  3. Essential hypertension Not at goal but he has been out of his medication.  Refills given. - lisinopril (ZESTRIL) 10 MG tablet; Take 1 tablet (10 mg total) by mouth daily.  Dispense: 90 tablet; Refill: 3  4. Tobacco dependence Commended him on not smoking in 3 months.  Encouraged him to remain free of nicotine.  Discussed health risks associated with smoking.  Less than 5 minutes spent on counseling.  5. Need for influenza vaccination Given today.  6. Screening for HIV (human immunodeficiency virus) Due in his health maintenance and patient agreeable to being screened. - HIV  antibody  7. Abnormal LFTs - Hepatitis C Antibody    Patient was given the opportunity to ask questions.  Patient verbalized understanding of the plan and was able to repeat key elements of the plan.   Orders Placed This Encounter  Procedures  . HIV antibody  . Hepatitis C Antibody  . Glucose (CBG)  . HgB A1c     Requested Prescriptions   Signed Prescriptions Disp Refills  . insulin NPH-regular Human (NOVOLIN 70/30) (70-30) 100 UNIT/ML injection 10 mL 1    Sig: Inject 15 Units into the skin 2 (two) times daily with a meal. Until patient able to met with pharmacist to make change to Lantus.  . metFORMIN (GLUCOPHAGE) 1000 MG tablet 60 tablet 6    Sig: Take 1 tablet (1,000 mg total) by mouth 2 (two) times daily.  Marland Kitchen lovastatin (MEVACOR) 40 MG tablet 30 tablet 6    Sig: Take 1 tablet (40 mg total) by mouth at bedtime.  Marland Kitchen lisinopril (ZESTRIL) 10 MG tablet 90 tablet 3    Sig: Take 1 tablet (10 mg total) by mouth daily.  . Insulin Glargine (LANTUS SOLOSTAR) 100 UNIT/ML Solostar Pen 15 mL 11    Sig: Inject 20 Units into the skin daily.  . Insulin Pen Needle (PEN NEEDLES) 31G X 8 MM MISC 100 each 6    Sig: UAD    Return in about 1 month (around 04/04/2019).  Karle Plumber, MD, FACP

## 2019-03-04 NOTE — Patient Instructions (Addendum)
We plan to switch him to Lantus insulin 20 units at bedtime.  You will need to meet with the clinical pharmacist so that he can teach you how to use the insulin pen.  In the meantime continue the Novolin 70/30 but increase it to 15 units twice a day.  Increase Metformin to 1000 mg twice a day.  Check your blood sugars twice a day before meals.  The goal is to get those blood sugars between 90-130.  Always keep a small snack with you in the event that the blood sugars drop below.  Please schedule pt an appointment with Franky Macho for this week   Diabetes Mellitus and Nutrition, Adult When you have diabetes (diabetes mellitus), it is very important to have healthy eating habits because your blood sugar (glucose) levels are greatly affected by what you eat and drink. Eating healthy foods in the appropriate amounts, at about the same times every day, can help you:  Control your blood glucose.  Lower your risk of heart disease.  Improve your blood pressure.  Reach or maintain a healthy weight. Every person with diabetes is different, and each person has different needs for a meal plan. Your health care provider may recommend that you work with a diet and nutrition specialist (dietitian) to make a meal plan that is best for you. Your meal plan may vary depending on factors such as:  The calories you need.  The medicines you take.  Your weight.  Your blood glucose, blood pressure, and cholesterol levels.  Your activity level.  Other health conditions you have, such as heart or kidney disease. How do carbohydrates affect me? Carbohydrates, also called carbs, affect your blood glucose level more than any other type of food. Eating carbs naturally raises the amount of glucose in your blood. Carb counting is a method for keeping track of how many carbs you eat. Counting carbs is important to keep your blood glucose at a healthy level, especially if you use insulin or take certain oral diabetes  medicines. It is important to know how many carbs you can safely have in each meal. This is different for every person. Your dietitian can help you calculate how many carbs you should have at each meal and for each snack. Foods that contain carbs include:  Bread, cereal, rice, pasta, and crackers.  Potatoes and corn.  Peas, beans, and lentils.  Milk and yogurt.  Fruit and juice.  Desserts, such as cakes, cookies, ice cream, and candy. How does alcohol affect me? Alcohol can cause a sudden decrease in blood glucose (hypoglycemia), especially if you use insulin or take certain oral diabetes medicines. Hypoglycemia can be a life-threatening condition. Symptoms of hypoglycemia (sleepiness, dizziness, and confusion) are similar to symptoms of having too much alcohol. If your health care provider says that alcohol is safe for you, follow these guidelines:  Limit alcohol intake to no more than 1 drink per day for nonpregnant women and 2 drinks per day for men. One drink equals 12 oz of beer, 5 oz of wine, or 1 oz of hard liquor.  Do not drink on an empty stomach.  Keep yourself hydrated with water, diet soda, or unsweetened iced tea.  Keep in mind that regular soda, juice, and other mixers may contain a lot of sugar and must be counted as carbs. What are tips for following this plan?  Reading food labels  Start by checking the serving size on the "Nutrition Facts" label of packaged foods and drinks.  The amount of calories, carbs, fats, and other nutrients listed on the label is based on one serving of the item. Many items contain more than one serving per package.  Check the total grams (g) of carbs in one serving. You can calculate the number of servings of carbs in one serving by dividing the total carbs by 15. For example, if a food has 30 g of total carbs, it would be equal to 2 servings of carbs.  Check the number of grams (g) of saturated and trans fats in one serving. Choose foods  that have low or no amount of these fats.  Check the number of milligrams (mg) of salt (sodium) in one serving. Most people should limit total sodium intake to less than 2,300 mg per day.  Always check the nutrition information of foods labeled as "low-fat" or "nonfat". These foods may be higher in added sugar or refined carbs and should be avoided.  Talk to your dietitian to identify your daily goals for nutrients listed on the label. Shopping  Avoid buying canned, premade, or processed foods. These foods tend to be high in fat, sodium, and added sugar.  Shop around the outside edge of the grocery store. This includes fresh fruits and vegetables, bulk grains, fresh meats, and fresh dairy. Cooking  Use low-heat cooking methods, such as baking, instead of high-heat cooking methods like deep frying.  Cook using healthy oils, such as olive, canola, or sunflower oil.  Avoid cooking with butter, cream, or high-fat meats. Meal planning  Eat meals and snacks regularly, preferably at the same times every day. Avoid going long periods of time without eating.  Eat foods high in fiber, such as fresh fruits, vegetables, beans, and whole grains. Talk to your dietitian about how many servings of carbs you can eat at each meal.  Eat 4-6 ounces (oz) of lean protein each day, such as lean meat, chicken, fish, eggs, or tofu. One oz of lean protein is equal to: ? 1 oz of meat, chicken, or fish. ? 1 egg. ?  cup of tofu.  Eat some foods each day that contain healthy fats, such as avocado, nuts, seeds, and fish. Lifestyle  Check your blood glucose regularly.  Exercise regularly as told by your health care provider. This may include: ? 150 minutes of moderate-intensity or vigorous-intensity exercise each week. This could be brisk walking, biking, or water aerobics. ? Stretching and doing strength exercises, such as yoga or weightlifting, at least 2 times a week.  Take medicines as told by your  health care provider.  Do not use any products that contain nicotine or tobacco, such as cigarettes and e-cigarettes. If you need help quitting, ask your health care provider.  Work with a Veterinary surgeon or diabetes educator to identify strategies to manage stress and any emotional and social challenges. Questions to ask a health care provider  Do I need to meet with a diabetes educator?  Do I need to meet with a dietitian?  What number can I call if I have questions?  When are the best times to check my blood glucose? Where to find more information:  American Diabetes Association: diabetes.org  Academy of Nutrition and Dietetics: www.eatright.[employer] of Diabetes and Digestive and Kidney Diseases (NIH): CarFlippers.tn Summary  A healthy meal plan will help you control your blood glucose and maintain a healthy lifestyle.  Working with a diet and nutrition specialist (dietitian) can help you make a meal plan that is best  for you.  Keep in mind that carbohydrates (carbs) and alcohol have immediate effects on your blood glucose levels. It is important to count carbs and to use alcohol carefully. This information is not intended to replace advice given to you by your health care provider. Make sure you discuss any questions you have with your health care provider. Document Released: 12/09/2004 Document Revised: 02/24/2017 Document Reviewed: 04/18/2016 Elsevier Patient Education  2020 Elsevier Inc.   Influenza Virus Vaccine injection (Fluarix) What is this medicine? INFLUENZA VIRUS VACCINE (in floo EN zuh VAHY ruhs vak SEEN) helps to reduce the risk of getting influenza also known as the flu. This medicine may be used for other purposes; ask your health care provider or pharmacist if you have questions. COMMON BRAND NAME(S): Fluarix, Fluzone What should I tell my health care provider before I take this medicine? They need to know if you have any of these  conditions:  bleeding disorder like hemophilia  fever or infection  Guillain-Barre syndrome or other neurological problems  immune system problems  infection with the human immunodeficiency virus (HIV) or AIDS  low blood platelet counts  multiple sclerosis  an unusual or allergic reaction to influenza virus vaccine, eggs, chicken proteins, latex, gentamicin, other medicines, foods, dyes or preservatives  pregnant or trying to get pregnant  breast-feeding How should I use this medicine? This vaccine is for injection into a muscle. It is given by a health care professional. A copy of Vaccine Information Statements will be given before each vaccination. Read this sheet carefully each time. The sheet may change frequently. Talk to your pediatrician regarding the use of this medicine in children. Special care may be needed. Overdosage: If you think you have taken too much of this medicine contact a poison control center or emergency room at once. NOTE: This medicine is only for you. Do not share this medicine with others. What if I miss a dose? This does not apply. What may interact with this medicine?  chemotherapy or radiation therapy  medicines that lower your immune system like etanercept, anakinra, infliximab, and adalimumab  medicines that treat or prevent blood clots like warfarin  phenytoin  steroid medicines like prednisone or cortisone  theophylline  vaccines This list may not describe all possible interactions. Give your health care provider a list of all the medicines, herbs, non-prescription drugs, or dietary supplements you use. Also tell them if you smoke, drink alcohol, or use illegal drugs. Some items may interact with your medicine. What should I watch for while using this medicine? Report any side effects that do not go away within 3 days to your doctor or health care professional. Call your health care provider if any unusual symptoms occur within 6 weeks  of receiving this vaccine. You may still catch the flu, but the illness is not usually as bad. You cannot get the flu from the vaccine. The vaccine will not protect against colds or other illnesses that may cause fever. The vaccine is needed every year. What side effects may I notice from receiving this medicine? Side effects that you should report to your doctor or health care professional as soon as possible:  allergic reactions like skin rash, itching or hives, swelling of the face, lips, or tongue Side effects that usually do not require medical attention (report to your doctor or health care professional if they continue or are bothersome):  fever  headache  muscle aches and pains  pain, tenderness, redness, or swelling at site where  injected  weak or tired This list may not describe all possible side effects. Call your doctor for medical advice about side effects. You may report side effects to FDA at 1-800-FDA-1088. Where should I keep my medicine? This vaccine is only given in a clinic, pharmacy, doctor's office, or other health care setting and will not be stored at home. NOTE: This sheet is a summary. It may not cover all possible information. If you have questions about this medicine, talk to your doctor, pharmacist, or health care provider.  2020 Elsevier/Gold Standard (2007-10-10 09:30:40)

## 2019-03-05 LAB — HEPATITIS C ANTIBODY: Hep C Virus Ab: <0.1 {s_co_ratio} (ref 0.0–0.9)

## 2019-03-05 LAB — MICROALBUMIN / CREATININE URINE RATIO
Creatinine, Urine: 81.4 mg/dL
Microalb/Creat Ratio: 7 mg/g{creat} (ref 0–29)
Microalbumin, Urine: 6 ug/mL

## 2019-03-05 LAB — HIV ANTIBODY (ROUTINE TESTING W REFLEX): HIV Screen 4th Generation wRfx: NONREACTIVE

## 2019-03-06 ENCOUNTER — Telehealth: Payer: Self-pay

## 2019-03-06 NOTE — Telephone Encounter (Signed)
Contacted pt to go over lab results pt is aware and doesn't have any questions or concerns 

## 2019-03-11 ENCOUNTER — Ambulatory Visit: Payer: Self-pay | Attending: Internal Medicine | Admitting: Pharmacist

## 2019-03-11 ENCOUNTER — Other Ambulatory Visit: Payer: Self-pay

## 2019-03-11 DIAGNOSIS — Z79899 Other long term (current) drug therapy: Secondary | ICD-10-CM

## 2019-03-11 NOTE — Progress Notes (Signed)
Patient was educated on the use of the Lantus pen. Reviewed necessary supplies and operation of the pen. Also reviewed goal blood glucose levels. Patient was able to demonstrate use. All questions and concerns were addressed.  

## 2019-04-12 ENCOUNTER — Ambulatory Visit: Payer: Self-pay | Attending: Internal Medicine | Admitting: Internal Medicine

## 2019-04-12 ENCOUNTER — Other Ambulatory Visit: Payer: Self-pay

## 2019-04-12 DIAGNOSIS — I10 Essential (primary) hypertension: Secondary | ICD-10-CM

## 2019-04-12 DIAGNOSIS — E1169 Type 2 diabetes mellitus with other specified complication: Secondary | ICD-10-CM

## 2019-04-12 DIAGNOSIS — E669 Obesity, unspecified: Secondary | ICD-10-CM

## 2019-04-12 MED FILL — LANTUS SOLOSTAR 100 UNITS/M: 100 | 30 days supply | Qty: 6 | Fill #1

## 2019-04-12 MED FILL — LISINOPRIL 10 MG TABS: 10 | 30 days supply | Qty: 30 | Fill #1

## 2019-04-12 MED FILL — LOVASTATIN 40 MG TABS: 40 | 30 days supply | Qty: 30 | Fill #1

## 2019-04-12 MED FILL — metFORMIN HCL 1000 MG TABS: 1000 | 30 days supply | Qty: 60 | Fill #1

## 2019-04-12 NOTE — Progress Notes (Signed)
Virtual Visit via Telephone Note Due to current restrictions/limitations of in-office visits due to the COVID-19 pandemic, this scheduled clinical appointment was converted to a telehealth visit  I connected with William Martin on 04/12/19 at 8:43 p.m by telephone and verified that I am speaking with the correct person using two identifiers. I am in my office.  The patient is at home.  Only the patient and myself participated in this encounter.  I discussed the limitations, risks, security and privacy concerns of performing an evaluation and management service by telephone and the availability of in person appointments. I also discussed with the patient that there may be a patient responsible charge related to this service. The patient expressed understanding and agreed to proceed.   History of Present Illness: Patient with history ofDM, HTN,HL,obesity,tobacco dependence (cigars). Last seen 02/2019.     DM:  Checking BS every few days before meals. Gives range 120-130 Reports compliance with Lantus 20 units daily and Metformin BID. Out of both medicines x1 day.  He plans to come to the pharmacy today to pick up his medications. Eating Habits:  Better.  Less fast foods.  Not too much fried foods.  Drinks mostly water or diet soda.  Doing more walking and moving around at work.    HTN: he did fill rxn for Lisinopril and taking as prescribed.  Out x 1 day.  No CP/SOB/LE edema Outpatient Encounter Medications as of 04/12/2019  Medication Sig  . Insulin Glargine (LANTUS SOLOSTAR) 100 UNIT/ML Solostar Pen Inject 20 Units into the skin daily.  . insulin NPH-regular Human (NOVOLIN 70/30) (70-30) 100 UNIT/ML injection Inject 15 Units into the skin 2 (two) times daily with a meal. Until patient able to met with pharmacist to make change to Lantus.  . Insulin Pen Needle (PEN NEEDLES) 31G X 8 MM MISC UAD  . lisinopril (ZESTRIL) 10 MG tablet Take 1 tablet (10 mg total) by mouth daily.  Marland Kitchen lovastatin  (MEVACOR) 40 MG tablet Take 1 tablet (40 mg total) by mouth at bedtime.  . metFORMIN (GLUCOPHAGE) 1000 MG tablet Take 1 tablet (1,000 mg total) by mouth 2 (two) times daily.  Marland Kitchen omeprazole (PRILOSEC) 20 MG capsule Take 1 capsule (20 mg total) by mouth daily as needed (heartburn).   No facility-administered encounter medications on file as of 04/12/2019.    Observations/Objective: Results for orders placed or performed in visit on 03/04/19  Microalbumin / creatinine urine ratio  Result Value Ref Range   Creatinine, Urine 81.4 Not Estab. mg/dL   Microalbumin, Urine 6.0 Not Estab. ug/mL   Microalb/Creat Ratio 7 0 - 29 mg/g creat  HIV antibody  Result Value Ref Range   HIV Screen 4th Generation wRfx Non Reactive Non Reactive  Hepatitis C Antibody  Result Value Ref Range   Hep C Virus Ab <0.1 0.0 - 0.9 s/co ratio  Glucose (CBG)  Result Value Ref Range   POC Glucose 307 (A) 70 - 99 mg/dl  HgB A1c  Result Value Ref Range   Hemoglobin A1C     HbA1c POC (<> result, manual entry)     HbA1c, POC (prediabetic range)     HbA1c, POC (controlled diabetic range) 12.1 (A) 0.0 - 7.0 %     Assessment and Plan: 1. Diabetes mellitus type 2 in obese [facility]) Reported blood sugars are at goal.  However he is not checking blood sugars every day.  Have encouraged him to check his blood sugars at least once a day in the  mornings before breakfast.  He will keep a log.  We will have him meet with the clinical pharmacist in 1 month.  Advised to bring his blood sugar logs with him. -Commended him on changes made in the diet so far.  Further dietary counseling given. Continue current dose of Metformin and Lantus.  Advised to avoid running out of medications  2. Essential hypertension Continue lisinopril.  He plans to pick up his next refill today   Follow Up Instructions: 3 mths   I discussed the assessment and treatment plan with the patient. The patient was provided an opportunity to ask questions and all  were answered. The patient agreed with the plan and demonstrated an understanding of the instructions.   The patient was advised to call back or seek an in-person evaluation if the symptoms worsen or if the condition fails to improve as anticipated.  I provided 8 minutes of non-face-to-face time during this encounter.   Karle Plumber, MD

## 2019-04-17 ENCOUNTER — Telehealth: Payer: Self-pay

## 2019-04-17 NOTE — Telephone Encounter (Signed)
Contacted pt to schedule 1 month f/u with Franky Macho for DM and 3 months with Dr. Laural Benes. Pt didn't answer lvm asking pt to give a call back to schedule

## 2019-05-13 ENCOUNTER — Other Ambulatory Visit: Payer: Self-pay

## 2019-05-13 ENCOUNTER — Ambulatory Visit: Payer: Self-pay | Attending: Internal Medicine | Admitting: Pharmacist

## 2019-05-13 DIAGNOSIS — E1169 Type 2 diabetes mellitus with other specified complication: Secondary | ICD-10-CM

## 2019-05-13 DIAGNOSIS — E669 Obesity, unspecified: Secondary | ICD-10-CM

## 2019-05-13 LAB — GLUCOSE, POCT (MANUAL RESULT ENTRY): POC Glucose: 177 mg/dl — AB (ref 70–99)

## 2019-05-13 MED ORDER — ATORVASTATIN CALCIUM 40 MG PO TABS: 40.0000 mg | ORAL_TABLET | Freq: Every day | ORAL | 2 refills | Status: DC

## 2019-05-13 MED FILL — LANTUS SOLOSTAR 100 UNITS/M: 100 | 30 days supply | Qty: 6 | Fill #2

## 2019-05-13 MED FILL — metFORMIN HCL 1000 MG TABS: 1000 | 30 days supply | Qty: 60 | Fill #2

## 2019-05-13 MED FILL — LISINOPRIL 10 MG TABS: 10 | 30 days supply | Qty: 30 | Fill #2

## 2019-05-13 MED FILL — ATORVASTATIN CALCIUM 40 MG: 40 | 30 days supply | Qty: 30 | Fill #0

## 2019-05-13 NOTE — Progress Notes (Signed)
    S:    PCP: Dr. Laural Benes   No chief complaint on file.  Patient arrives in good spirits.  Presents for diabetes evaluation, education, and management Patient was referred and last seen by Primary Care Provider on 04/12/19.    Family/Social History:  - FHx: DM - Tobacco: denies any recent use - Alcohol: endorses occasional use   Insurance coverage/medication affordability: None   Patient denies adherence with medications.  Current diabetes medications include: Lantus 20 units daily, metformin 1000 mg BID (taking one daily) Current hypertension medications include: lisinopril 10 mg daily  Current hyperlipidemia medications include: lovastatin 40 mg daily   Patient denies hypoglycemic events.  Patient reported dietary habits: Reports changing his diet at reported during last PCP encounter.  Patient-reported exercise habits: none outside of work   Patient denies nocturia (nighttime urination).  Patient denies neuropathy (nerve pain). Patient denies visual changes. Patient reports self foot exams.     O:  Physical Exam   ROS   Lab Results  Component Value Date   HGBA1C 12.1 (A) 03/04/2019   There were no vitals filed for this visit.  Lipid Panel     Component Value Date/Time   CHOL 245 (H) 01/27/2017 1446   TRIG 196 (H) 01/27/2017 1446   HDL 33 (L) 01/27/2017 1446   CHOLHDL 7.4 (H) 01/27/2017 1446   CHOLHDL 9.7 03/12/2016 0557   VLDL UNABLE TO CALCULATE IF TRIGLYCERIDE OVER 400 mg/dL 09/62/8366 2947   LDLCALC 173 (H) 01/27/2017 1446   Home blood sugars: 83 - 141. 1 outlier 218  Clinical Atherosclerotic Cardiovascular Disease (ASCVD): No  The ASCVD Risk score Denman George DC Jr., et al., 2013) failed to calculate for the following reasons:   The 2013 ASCVD risk score is only valid for ages 69 to 72   A/P: Diabetes longstanding currently uncontrolled based on A1c, however, home CBGs reveal improvement. Patient is able to verbalize appropriate hypoglycemia  management plan. Patient is not adherent with medication. Recommended to increase metformin to BID as prescribed.  -Continued current regimen.  -Extensively discussed pathophysiology of diabetes, recommended lifestyle interventions, dietary effects on blood sugar control -Counseled on s/sx of and management of hypoglycemia -Next A1C anticipated 05/2019.   ASCVD risk - primary prevention in patient with diabetes. Last LDL is not controlled, but we need updated panel. ASCVD risk score cannot be calculated.  I recommend to intensify to high intensity statin.  -Discontinued lovastatin 40 mg.  -Started atorvastatin 40 mg.   HM: UTD on recommended vaccines.   Written patient instructions provided. Total time in face to face counseling 15 minutes.   Follow up Pharmacist Clinic Visit in 1 month.    Butch Penny, PharmD, CPP Clinical Pharmacist [facility] [phone]

## 2019-05-13 NOTE — Patient Instructions (Addendum)
Thank you for coming to see me today. Please do the following:  1. Start taking metformin twice a day. 2. Decrease Lantus to 18 units daily. After several days, if you get low blood sugar levels, you can decrease further to 16 units.  3. Stop Lovastatin. Start atorvastatin 40 mg. Take 1 tablet daily.  4. Continue checking blood sugars at home. 5. Continue making the lifestyle changes we've discussed together during our visit. Diet and exercise play a significant role in improving your blood sugars.  6. Follow-up with me in 1 month.    Hypoglycemia or low blood sugar:   Low blood sugar can happen quickly and may become an emergency if not treated right away.   While this shouldn't happen often, it can be brought upon if you skip a meal or do not eat enough. Also, if your insulin or other diabetes medications are dosed too high, this can cause your blood sugar to go to low.   Warning signs of low blood sugar include: 1. Feeling shaky or dizzy 2. Feeling weak or tired  3. Excessive hunger 4. Feeling anxious or upset  5. Sweating even when you aren't exercising  What to do if I experience low blood sugar? 1. Check your blood sugar with your meter. If lower than 70, proceed to step 2.  2. Treat with 3-4 glucose tablets or 3 packets of regular sugar. If these aren't around, you can try hard candy. Yet another option would be to drink 4 ounces of fruit juice or 6 ounces of REGULAR soda.  3. Re-check your sugar in 15 minutes. If it is still below 70, do what you did in step 2 again. If has come back up, go ahead and eat a snack or small meal at this time.

## 2019-06-10 ENCOUNTER — Other Ambulatory Visit: Payer: Self-pay

## 2019-06-10 ENCOUNTER — Ambulatory Visit: Payer: Self-pay | Attending: Internal Medicine | Admitting: Pharmacist

## 2019-06-10 DIAGNOSIS — I10 Essential (primary) hypertension: Secondary | ICD-10-CM

## 2019-06-10 NOTE — Progress Notes (Signed)
    S:    PCP: Dr. Laural Benes   No chief complaint on file.  Patient arrives in good spirits.  Presents for diabetes evaluation, education, and management Patient was referred and last seen by Primary Care Provider on 04/12/19. I saw him last month and had him take metformin BID as prescribed (he had reported taking it once daily).   Family/Social History:  - FHx: DM - Tobacco: denies any recent use - Alcohol: endorses occasional use   Insurance coverage/medication affordability: None   Patient reports adherence with medications.  Current diabetes medications include: Lantus 20 units daily, metformin 1000 mg BID  Current hypertension medications include: lisinopril 10 mg daily  Current hyperlipidemia medications include: atorvastatin 40 mg daily   Patient denies hypoglycemic events.  Patient reported dietary habits:  - Reports maintaining his changes in diet   Patient-reported exercise habits:  - None outside of work   Patient denies nocturia (nighttime urination).  Patient denies neuropathy (nerve pain). Patient denies visual changes. Patient reports self foot exams.     O:  Lab Results  Component Value Date   HGBA1C 12.1 (A) 03/04/2019   There were no vitals filed for this visit.  Lipid Panel     Component Value Date/Time   CHOL 245 (H) 01/27/2017 1446   TRIG 196 (H) 01/27/2017 1446   HDL 33 (L) 01/27/2017 1446   CHOLHDL 7.4 (H) 01/27/2017 1446   CHOLHDL 9.7 03/12/2016 0557   VLDL UNABLE TO CALCULATE IF TRIGLYCERIDE OVER 400 mg/dL 53/29/9242 6834   LDLCALC 173 (H) 01/27/2017 1446   Home blood sugars: 85 - 132. Denies any home CBG > 180.   Clinical Atherosclerotic Cardiovascular Disease (ASCVD): No  The ASCVD Risk score Denman George DC Jr., et al., 2013) failed to calculate for the following reasons:   The 2013 ASCVD risk score is only valid for ages 76 to 36   A/P: Diabetes longstanding currently uncontrolled based on A1c, however, home CBGs reveal improvement.  Patient is able to verbalize appropriate hypoglycemia management plan. Patient is adherent with medication, including to metformin 1000 mg BID. -Continued current regimen.  -Extensively discussed pathophysiology of diabetes, recommended lifestyle interventions, dietary effects on blood sugar control -Counseled on s/sx of and management of hypoglycemia -A1c due today; will hold off until PCP visit next month.   ASCVD risk - primary prevention in patient with diabetes. Last LDL is not controlled, but we need updated panel. At last visit, we switched to atorvastatin 40 mg daily. Patient denies any muscle aches or fatigue.  -Continue atorvastatin 40 mg.   HM: UTD on recommended vaccines.   Written patient instructions provided. Total time in face to face counseling 15 minutes.   Follow up with PCP next month.    Butch Penny, PharmD, CPP Clinical Pharmacist [facility] [phone]

## 2019-06-12 MED FILL — !LANTUS SOLOSTAR 100UNITS/M: 100 | 30 days supply | Qty: 6 | Fill #3

## 2019-06-12 MED FILL — LISINOPRIL 10 MG TABS: 10 | 30 days supply | Qty: 30 | Fill #3

## 2019-06-12 MED FILL — ATORVASTATIN CALCIUM 40 MG: 40 | 30 days supply | Qty: 30 | Fill #1

## 2019-06-12 MED FILL — metFORMIN HCL 1000 MG TABS: 1000 | 30 days supply | Qty: 60 | Fill #3

## 2019-07-19 ENCOUNTER — Other Ambulatory Visit: Payer: Self-pay | Admitting: Internal Medicine

## 2019-07-19 ENCOUNTER — Encounter: Payer: Self-pay | Admitting: Internal Medicine

## 2019-07-19 ENCOUNTER — Other Ambulatory Visit: Payer: Self-pay

## 2019-07-19 ENCOUNTER — Ambulatory Visit: Payer: BC Managed Care – PPO | Attending: Internal Medicine | Admitting: Internal Medicine

## 2019-07-19 VITALS — BP 150/100 | HR 78 | Temp 98.2°F | Resp 16 | Wt 291.0 lb

## 2019-07-19 DIAGNOSIS — I10 Essential (primary) hypertension: Secondary | ICD-10-CM

## 2019-07-19 DIAGNOSIS — Z6841 Body Mass Index (BMI) 40.0 and over, adult: Secondary | ICD-10-CM

## 2019-07-19 DIAGNOSIS — E669 Obesity, unspecified: Secondary | ICD-10-CM

## 2019-07-19 DIAGNOSIS — F17200 Nicotine dependence, unspecified, uncomplicated: Secondary | ICD-10-CM | POA: Diagnosis not present

## 2019-07-19 DIAGNOSIS — E785 Hyperlipidemia, unspecified: Secondary | ICD-10-CM

## 2019-07-19 DIAGNOSIS — E1169 Type 2 diabetes mellitus with other specified complication: Secondary | ICD-10-CM

## 2019-07-19 LAB — GLUCOSE, POCT (MANUAL RESULT ENTRY): POC Glucose: 130 mg/dl — AB (ref 70–99)

## 2019-07-19 MED ORDER — ATORVASTATIN CALCIUM 40 MG PO TABS: 40.0000 mg | ORAL_TABLET | Freq: Every day | ORAL | 5 refills | Status: DC

## 2019-07-19 MED FILL — !LANTUS SOLOSTAR 100UNITS/M: 100 | 30 days supply | Qty: 6 | Fill #4

## 2019-07-19 MED FILL — ?ATORVASTATIN 40MG TABLET: 40 | 30 days supply | Qty: 30 | Fill #2

## 2019-07-19 MED FILL — metFORMIN HCL 1000 MG TABS: 1000 | 30 days supply | Qty: 60 | Fill #4

## 2019-07-19 MED FILL — LISINOPRIL 10 MG TABS: 10 | 30 days supply | Qty: 30 | Fill #4

## 2019-07-19 NOTE — Patient Instructions (Signed)
You can get the COVID-19 vaccine from any outside pharmacy or at the health department.   Diabetes Mellitus and Exercise Exercising regularly is important for your overall health, especially when you have diabetes (diabetes mellitus). Exercising is not only about losing weight. It has many other health benefits, such as increasing muscle strength and bone density and reducing body fat and stress. This leads to improved fitness, flexibility, and endurance, all of which result in better overall health. Exercise has additional benefits for people with diabetes, including:  Reducing appetite.  Helping to lower and control blood glucose.  Lowering blood pressure.  Helping to control amounts of fatty substances (lipids) in the blood, such as cholesterol and triglycerides.  Helping the body to respond better to insulin (improving insulin sensitivity).  Reducing how much insulin the body needs.  Decreasing the risk for heart disease by: ? Lowering cholesterol and triglyceride levels. ? Increasing the levels of good cholesterol. ? Lowering blood glucose levels. What is my activity plan? Your health care provider or certified diabetes educator can help you make a plan for the type and frequency of exercise (activity plan) that works for you. Make sure that you:  Do at least 150 minutes of moderate-intensity or vigorous-intensity exercise each week. This could be brisk walking, biking, or water aerobics. ? Do stretching and strength exercises, such as yoga or weightlifting, at least 2 times a week. ? Spread out your activity over at least 3 days of the week.  Get some form of physical activity every day. ? Do not go more than 2 days in a row without some kind of physical activity. ? Avoid being inactive for more than 30 minutes at a time. Take frequent breaks to walk or stretch.  Choose a type of exercise or activity that you enjoy, and set realistic goals.  Start slowly, and gradually  increase the intensity of your exercise over time. What do I need to know about managing my diabetes?   Check your blood glucose before and after exercising. ? If your blood glucose is 240 mg/dL (11.9 mmol/L) or higher before you exercise, check your urine for ketones. If you have ketones in your urine, do not exercise until your blood glucose returns to normal. ? If your blood glucose is 100 mg/dL (5.6 mmol/L) or lower, eat a snack containing 15-20 grams of carbohydrate. Check your blood glucose 15 minutes after the snack to make sure that your level is above 100 mg/dL (5.6 mmol/L) before you start your exercise.  Know the symptoms of low blood glucose (hypoglycemia) and how to treat it. Your risk for hypoglycemia increases during and after exercise. Common symptoms of hypoglycemia can include: ? Hunger. ? Anxiety. ? Sweating and feeling clammy. ? Confusion. ? Dizziness or feeling light-headed. ? Increased heart rate or palpitations. ? Blurry vision. ? Tingling or numbness around the mouth, lips, or tongue. ? Tremors or shakes. ? Irritability.  Keep a rapid-acting carbohydrate snack available before, during, and after exercise to help prevent or treat hypoglycemia.  Avoid injecting insulin into areas of the body that are going to be exercised. For example, avoid injecting insulin into: ? The arms, when playing tennis. ? The legs, when jogging.  Keep records of your exercise habits. Doing this can help you and your health care provider adjust your diabetes management plan as needed. Write down: ? Food that you eat before and after you exercise. ? Blood glucose levels before and after you exercise. ? The type and  amount of exercise you have done. ? When your insulin is expected to peak, if you use insulin. Avoid exercising at times when your insulin is peaking.  When you start a new exercise or activity, work with your health care provider to make sure the activity is safe for you, and  to adjust your insulin, medicines, or food intake as needed.  Drink plenty of water while you exercise to prevent dehydration or heat stroke. Drink enough fluid to keep your urine clear or pale yellow. Summary  Exercising regularly is important for your overall health, especially when you have diabetes (diabetes mellitus).  Exercising has many health benefits, such as increasing muscle strength and bone density and reducing body fat and stress.  Your health care provider or certified diabetes educator can help you make a plan for the type and frequency of exercise (activity plan) that works for you.  When you start a new exercise or activity, work with your health care provider to make sure the activity is safe for you, and to adjust your insulin, medicines, or food intake as needed. This information is not intended to replace advice given to you by your health care provider. Make sure you discuss any questions you have with your health care provider. Document Revised: 10/06/2016 Document Reviewed: 08/24/2015 Elsevier Patient Education  [place].

## 2019-07-19 NOTE — Progress Notes (Signed)
Patient ID: William Martin, male    DOB: Nov 01, 1980  MRN: [id number]  CC: Diabetes and Hypertension   Subjective: William Martin is a 39 y.o. male who presents for chronic ds management His concerns today include:  Patient with history ofDM, HTN,HL,obesity,tobacco dependence(cigars  DIABETES TYPE 2/Obesity Last A1C:   Results for orders placed or performed in visit on 07/19/19  POCT glucose (manual entry)  Result Value Ref Range   POC Glucose 130 (A) 70 - 99 mg/dl    Med Adherence:  [x]  Yes - Metformin BID and Lantus 16 units    []  No Medication side effects:  []  Yes    [x]  No Home Monitoring?  [x]  Yes late at nights when he gets off work    []  No Home glucose results range:100-130 Diet Adherence: []  Yes    [x]  No.  Gained almost 30 lbs since 02/2019  Exercise: []  Yes    [x]  No - plans to start moving more.  Has treadmill and boxing bag.  Hypoglycemic episodes?: []  Yes    [x]  No Numbness of the feet? []  Yes    [x]  No Retinopathy hx? []  Yes    []  No Last eye exam:  Comments:   HYPERTENSION Currently taking: see medication list.  On Lisinopril. Med Adherence: [x]  Yes but ran out yesterday   []  No Medication side effects: []  Yes    [x]  No Adherence with salt restriction: [x]  Yes    []  No Home Monitoring?: []  Yes    [x]  No Monitoring Frequency: []  Yes    []  No Home BP results range: []  Yes    []  No SOB? []  Yes    [x]  No Chest Pain?: []  Yes    [x]  No Leg swelling?: []  Yes    [x]  No Headaches?: []  Yes    [x]  No Dizziness? []  Yes    [x]  No Comments:   HL:  Compliant with Lipitor.  Tob dep: down to 2 cigars a mth.  Not wanting to quit.  He likes the way it relaxes him.  Patient Active Problem List   Diagnosis Date Noted  . Hyperlipidemia associated with type 2 diabetes mellitus (HCC) 03/04/2019  . Tobacco dependence 07/31/2018  . Immunization due 01/27/2017  . Essential hypertension 01/27/2017  . Former cigar smoker 01/27/2017  . Obesity 03/14/2016  .  Hyperlipidemia 03/14/2016  . Hyperlipidemia due to type 2 diabetes mellitus (HCC) 03/14/2016  . New onset type 2 diabetes mellitus (HCC) 03/11/2016  . GERD (gastroesophageal reflux disease) 03/11/2016     Current Outpatient Medications on File Prior to Visit  Medication Sig Dispense Refill  . atorvastatin (LIPITOR) 40 MG tablet Take 1 tablet (40 mg total) by mouth daily. 30 tablet 2  . Insulin Glargine (LANTUS SOLOSTAR) 100 UNIT/ML Solostar Pen Inject 20 Units into the skin daily. 15 mL 11  . Insulin Pen Needle (PEN NEEDLES) 31G X 8 MM MISC UAD 100 each 6  . lisinopril (ZESTRIL) 10 MG tablet Take 1 tablet (10 mg total) by mouth daily. 90 tablet 3  . metFORMIN (GLUCOPHAGE) 1000 MG tablet Take 1 tablet (1,000 mg total) by mouth 2 (two) times daily. 60 tablet 6  . omeprazole (PRILOSEC) 20 MG capsule Take 1 capsule (20 mg total) by mouth daily as needed (heartburn). 30 capsule 0   No current facility-administered medications on file prior to visit.    Allergies  Allergen Reactions  . Bee Venom Swelling    Social History   Socioeconomic  History  . Marital status: Single    Spouse name: Not on file  . Number of children: Not on file  . Years of education: Not on file  . Highest education level: Not on file  Occupational History  . Not on file  Tobacco Use  . Smoking status: Current Some Day Smoker  . Smokeless tobacco: Never Used  Substance and Sexual Activity  . Alcohol use: Yes    Comment: occasional  . Drug use: No  . Sexual activity: Not on file  Other Topics Concern  . Not on file  Social History Narrative  . Not on file   Social Determinants of Health   Financial Resource Strain:   . Difficulty of Paying Living Expenses:   Food Insecurity:   . Worried About Charity fundraiser in the Last Year:   . Arboriculturist in the Last Year:   Transportation Needs:   . Film/video editor (Medical):   Marland Kitchen Lack of Transportation (Non-Medical):   Physical Activity:   .  Days of Exercise per Week:   . Minutes of Exercise per Session:   Stress:   . Feeling of Stress :   Social Connections:   . Frequency of Communication with Friends and Family:   . Frequency of Social Gatherings with Friends and Family:   . Attends Religious Services:   . Active Member of Clubs or Organizations:   . Attends Archivist Meetings:   Marland Kitchen Marital Status:   Intimate Partner Violence:   . Fear of Current or Ex-Partner:   . Emotionally Abused:   Marland Kitchen Physically Abused:   . Sexually Abused:     Family History  Problem Relation Age of Onset  . Diabetes type II Mother     No past surgical history on file.  ROS: Review of Systems Negative except as stated above  PHYSICAL EXAM: BP (!) 150/100   Pulse 78   Temp 98.2 F (36.8 C)   Resp 16   Wt 291 lb (132 kg)   SpO2 98%   BMI 41.75 kg/m   Wt Readings from Last 3 Encounters:  07/19/19 291 lb (132 kg)  03/04/19 262 lb 12.8 oz (119.2 kg)  01/31/19 260 lb (117.9 kg)    Physical Exam      General appearance - alert, well appearing, and in no distress Mental status - normal mood, behavior, speech, dress, motor activity, and thought processes Mouth - mucous membranes moist, pharynx normal without lesions Neck - supple, no significant adenopathy Chest - clear to auscultation, no wheezes, rales or rhonchi, symmetric air entry Heart - normal rate, regular rhythm, normal S1, S2, no murmurs, rubs, clicks or gallops Extremities - peripheral pulses normal, no pedal edema, no clubbing or cyanosis  CMP Latest Ref Rng & Units 02/05/2019 01/31/2019 01/09/2019  Glucose 70 - 99 mg/dL 668(HH) 447(H) 411(H)  BUN 6 - 20 mg/dL 16 15 16   Creatinine 0.61 - 1.24 mg/dL 1.19 1.13 1.18  Sodium 135 - 145 mmol/L 131(L) 130(L) 134(L)  Potassium 3.5 - 5.1 mmol/L 4.9 4.2 4.1  Chloride 98 - 111 mmol/L 96(L) 95(L) 98  CO2 22 - 32 mmol/L 21(L) 21(L) 22  Calcium 8.9 - 10.3 mg/dL 9.3 8.9 9.3  Total Protein 6.5 - 8.1 g/dL - - 8.1  Total  Bilirubin 0.3 - 1.2 mg/dL - - 3.0(H)  Alkaline Phos 38 - 126 U/L - - 65  AST 15 - 41 U/L - - 27  ALT  0 - 44 U/L - - 55(H)   Lipid Panel     Component Value Date/Time   CHOL 245 (H) 01/27/2017 1446   TRIG 196 (H) 01/27/2017 1446   HDL 33 (L) 01/27/2017 1446   CHOLHDL 7.4 (H) 01/27/2017 1446   CHOLHDL 9.7 03/12/2016 0557   VLDL UNABLE TO CALCULATE IF TRIGLYCERIDE OVER 400 mg/dL 68/01/5725 2035   LDLCALC 173 (H) 01/27/2017 1446    CBC    Component Value Date/Time   WBC 7.8 02/05/2019 1227   RBC 4.89 02/05/2019 1227   HGB 13.7 02/05/2019 1227   HCT 40.9 02/05/2019 1227   PLT 303 02/05/2019 1227   MCV 83.6 02/05/2019 1227   MCH 28.0 02/05/2019 1227   MCHC 33.5 02/05/2019 1227   RDW 15.1 02/05/2019 1227    ASSESSMENT AND PLAN: 1. Diabetes mellitus type 2 in obese [facility]) Dietary counseling given. Exercise counseling given.  Encouraged him to get in about 150 minutes/week total of moderate intensity exercise.  However he will start low and go slow and gradually build up to 150 minutes/week. Continue current dose of Lantus and Metformin.  He will stop at the lab today for A1c check. - POCT glucose (manual entry) - atorvastatin (LIPITOR) 40 MG tablet; Take 1 tablet (40 mg total) by mouth daily.  Dispense: 30 tablet; Refill: 5 - Hemoglobin A1c  2. Class 3 severe obesity due to excess calories with serious comorbidity and body mass index (BMI) of 40.0 to 44.9 in adult [facility]) See #1 above  3. Essential hypertension Not at goal.  However he has not taken lisinopril today as he is out.  He plans to pick up refill today from the pharmacy  4. Hyperlipidemia associated with type 2 diabetes mellitus (HCC) Continue Lipitor.  5. Tobacco dependence Strongly advised to quit.  Discussed health risks associated with smoking.  Patient not ready to give a trial of quitting.  Less than 5 minutes spent on counseling.  Discussed COVID-19 vaccine and encouraged him to get vaccinated.  After our  discussion he feels that he will get the vaccine and I have told him of places where he can go to get it.    Patient was given the opportunity to ask questions.  Patient verbalized understanding of the plan and was able to repeat key elements of the plan.   Orders Placed This Encounter  Procedures  . POCT glucose (manual entry)     Requested Prescriptions    No prescriptions requested or ordered in this encounter    No follow-ups on file.  Jonah Blue, MD, FACP

## 2019-07-20 LAB — HEMOGLOBIN A1C
Est. average glucose Bld gHb Est-mCnc: 148 mg/dL
Hgb A1c MFr Bld: 6.8 % — ABNORMAL HIGH (ref 4.8–5.6)

## 2019-08-06 DIAGNOSIS — H20011 Primary iridocyclitis, right eye: Secondary | ICD-10-CM | POA: Diagnosis not present

## 2019-08-09 DIAGNOSIS — H20011 Primary iridocyclitis, right eye: Secondary | ICD-10-CM | POA: Diagnosis not present

## 2019-08-21 MED FILL — metFORMIN HCL 1000 MG TABS: 1000 | 30 days supply | Qty: 60 | Fill #5

## 2019-08-21 MED FILL — ATORVASTATIN CALCIUM 40 MG: 40 | 30 days supply | Qty: 30 | Fill #0

## 2019-08-21 MED FILL — LISINOPRIL 10 MG TABS: 10 | 30 days supply | Qty: 30 | Fill #5

## 2019-08-21 MED FILL — !LANTUS SOLOSTAR 100UNITS/M: 100 | 30 days supply | Qty: 6 | Fill #5

## 2019-09-20 MED FILL — LISINOPRIL 10 MG TABS: 10 | 30 days supply | Qty: 30 | Fill #6

## 2019-09-20 MED FILL — ATORVASTATIN CALCIUM 40 MG: 40 | 30 days supply | Qty: 30 | Fill #1

## 2019-09-20 MED FILL — !LANTUS SOLOSTAR 100UNITS/M: 100 | 30 days supply | Qty: 6 | Fill #6

## 2019-10-18 ENCOUNTER — Ambulatory Visit: Payer: BC Managed Care – PPO | Attending: Internal Medicine | Admitting: Internal Medicine

## 2019-10-18 ENCOUNTER — Other Ambulatory Visit: Payer: Self-pay

## 2019-10-18 ENCOUNTER — Encounter: Payer: Self-pay | Admitting: Internal Medicine

## 2019-10-18 DIAGNOSIS — E1169 Type 2 diabetes mellitus with other specified complication: Secondary | ICD-10-CM

## 2019-10-18 DIAGNOSIS — E785 Hyperlipidemia, unspecified: Secondary | ICD-10-CM

## 2019-10-18 DIAGNOSIS — I10 Essential (primary) hypertension: Secondary | ICD-10-CM | POA: Diagnosis not present

## 2019-10-18 DIAGNOSIS — E669 Obesity, unspecified: Secondary | ICD-10-CM

## 2019-10-18 DIAGNOSIS — Z6841 Body Mass Index (BMI) 40.0 and over, adult: Secondary | ICD-10-CM

## 2019-10-18 DIAGNOSIS — F17200 Nicotine dependence, unspecified, uncomplicated: Secondary | ICD-10-CM | POA: Diagnosis not present

## 2019-10-18 LAB — POCT GLYCOSYLATED HEMOGLOBIN (HGB A1C): HbA1c, POC (controlled diabetic range): 7.1 % — AB (ref 0.0–7.0)

## 2019-10-18 LAB — GLUCOSE, POCT (MANUAL RESULT ENTRY): POC Glucose: 131 mg/dl — AB (ref 70–99)

## 2019-10-18 NOTE — Patient Instructions (Signed)

## 2019-10-18 NOTE — Progress Notes (Signed)
Patient ID: William Martin, male    DOB: October 18, 1980  MRN: [id number]  CC: Diabetes and Hypertension   Subjective: William Martin is a 39 y.o. male who presents for chronic s management. His concerns today include:  Patient with history ofDM, HTN,HL,obesity,tobacco dependence(cigars).  Last seen 06/2019  HYPERTENSION Currently taking: see medication list.  He is on lisinopril Med Adherence: [x]  Yes but did not take as yet for the morning Medication side effects: []  Yes    [x]  No Adherence with salt restriction: [x]  Yes    []  No Home Monitoring?: []  Yes    [x]  No Monitoring Frequency: []  Yes    []  No Home BP results range: []  Yes    []  No SOB? []  Yes    [x]  No Chest Pain?: []  Yes    [x]  No Leg swelling?: []  Yes    [x]  No Headaches?: []  Yes    [x]  No Dizziness? []  Yes    [x]  No Comments:   DIABETES TYPE 2/Obesity Last A1C:   Results for orders placed or performed in visit on 10/18/19  POCT glucose (manual entry)  Result Value Ref Range   POC Glucose 131 (A) 70 - 99 mg/dl  POCT glycosylated hemoglobin (Hb A1C)  Result Value Ref Range   Hemoglobin A1C     HbA1c POC (<> result, manual entry)     HbA1c, POC (prediabetic range)     HbA1c, POC (controlled diabetic range) 7.1 (A) 0.0 - 7.0 %    Med Adherence:  [x]  Yes - Lantus 16 units and Metformin BID   Medication side effects:  []  Yes    [x]  No Home Monitoring?  [x]  Yes  But not as often as he should Home glucose results range: Diet Adherence: eats fast food out of convenience.  Works 2nd shift and tends to eat fast food on break.  Drinks sweet tea. Gained 30 lbs over past 7 months Exercise: []  Yes    [x]  No.  Works 8-10 hr shift. In past, he worked out right after he gets off work.   Hypoglycemic episodes?: []  Yes    []  No Numbness of the feet? []  Yes    [x]  No Retinopathy hx? []  Yes    [x]  No Last eye exam: Reports having eye exam in Feb or March of this yr with [facility].  Told no retinopathy.  Comments:     HL:  Tolerating Lipitor  Tobacco Dep: He has cut back on smoking cigars but not ready to quit completely.  HM:  Completed Moderna COVID vac - 6/1 and 09/24/19.   Patient Active Problem List   Diagnosis Date Noted  . Hyperlipidemia associated with type 2 diabetes mellitus (HCC) 03/04/2019  . Tobacco dependence 07/31/2018  . Immunization due 01/27/2017  . Essential hypertension 01/27/2017  . Former cigar smoker 01/27/2017  . Obesity 03/14/2016  . Hyperlipidemia 03/14/2016  . Hyperlipidemia due to type 2 diabetes mellitus (HCC) 03/14/2016  . New onset type 2 diabetes mellitus (HCC) 03/11/2016  . GERD (gastroesophageal reflux disease) 03/11/2016     Current Outpatient Medications on File Prior to Visit  Medication Sig Dispense Refill  . atorvastatin (LIPITOR) 40 MG tablet Take 1 tablet (40 mg total) by mouth daily. 30 tablet 5  . Insulin Glargine (LANTUS SOLOSTAR) 100 UNIT/ML Solostar Pen Inject 20 Units into the skin daily. 15 mL 11  . Insulin Pen Needle (PEN NEEDLES) 31G X 8 MM MISC UAD 100 each 6  .  lisinopril (ZESTRIL) 10 MG tablet Take 1 tablet (10 mg total) by mouth daily. 90 tablet 3  . metFORMIN (GLUCOPHAGE) 1000 MG tablet Take 1 tablet (1,000 mg total) by mouth 2 (two) times daily. 60 tablet 6  . omeprazole (PRILOSEC) 20 MG capsule Take 1 capsule (20 mg total) by mouth daily as needed (heartburn). 30 capsule 0   No current facility-administered medications on file prior to visit.    Allergies  Allergen Reactions  . Bee Venom Swelling    Social History   Socioeconomic History  . Marital status: Single    Spouse name: Not on file  . Number of children: Not on file  . Years of education: Not on file  . Highest education level: Not on file  Occupational History  . Not on file  Tobacco Use  . Smoking status: Current Some Day Smoker  . Smokeless tobacco: Never Used  Vaping Use  . Vaping Use: Never used  Substance and Sexual Activity  . Alcohol use: Yes     Comment: occasional  . Drug use: No  . Sexual activity: Not on file  Other Topics Concern  . Not on file  Social History Narrative  . Not on file   Social Determinants of Health   Financial Resource Strain:   . Difficulty of Paying Living Expenses:   Food Insecurity:   . Worried About Programme researcher, broadcasting/film/video in the Last Year:   . Barista in the Last Year:   Transportation Needs:   . Freight forwarder (Medical):   Marland Kitchen Lack of Transportation (Non-Medical):   Physical Activity:   . Days of Exercise per Week:   . Minutes of Exercise per Session:   Stress:   . Feeling of Stress :   Social Connections:   . Frequency of Communication with Friends and Family:   . Frequency of Social Gatherings with Friends and Family:   . Attends Religious Services:   . Active Member of Clubs or Organizations:   . Attends Banker Meetings:   Marland Kitchen Marital Status:   Intimate Partner Violence:   . Fear of Current or Ex-Partner:   . Emotionally Abused:   Marland Kitchen Physically Abused:   . Sexually Abused:     Family History  Problem Relation Age of Onset  . Diabetes type II Mother     No past surgical history on file.  ROS: Review of Systems Negative except as stated above  PHYSICAL EXAM: BP (!) 150/100   Pulse 76   Resp 16   Wt (!) 292 lb 9.6 oz (132.7 kg)   SpO2 98%   BMI 41.98 kg/m   Wt Readings from Last 3 Encounters:  10/18/19 (!) 292 lb 9.6 oz (132.7 kg)  07/19/19 291 lb (132 kg)  03/04/19 262 lb 12.8 oz (119.2 kg)    Physical Exam General appearance - alert, well appearing, obese African-American male and in no distress Mental status - normal mood, behavior, speech, dress, motor activity, and thought processes Mouth - mucous membranes moist, pharynx normal without lesions Neck - supple, no significant adenopathy Chest - clear to auscultation, no wheezes, rales or rhonchi, symmetric air entry Heart - normal rate, regular rhythm, normal S1, S2, no murmurs, rubs,  clicks or gallops Extremities - peripheral pulses normal, no pedal edema, no clubbing or cyanosis Diabetic Foot Exam - Simple   Simple Foot Form Visual Inspection No deformities, no ulcerations, no other skin breakdown bilaterally: Yes Sensation Testing Intact  to touch and monofilament testing bilaterally: Yes Pulse Check Posterior Tibialis and Dorsalis pulse intact bilaterally: Yes Comments     CMP Latest Ref Rng & Units 02/05/2019 01/31/2019 01/09/2019  Glucose 70 - 99 mg/dL 235(TI) 144(R) 154(M)  BUN 6 - 20 mg/dL 16 15 16   Creatinine 0.61 - 1.24 mg/dL 0.86 7.61  Sodium 135 - 145 mmol/L 131(L) 130(L) 134(L)  Potassium 3.5 - 5.1 mmol/L 4.9 4.2 4.1  Chloride 98 - 111 mmol/L 96(L) 95(L) 98  CO2 22 - 32 mmol/L 21(L) 21(L) 22  Calcium 8.9 - 10.3 mg/dL 9.3 8.9 9.3  Total Protein 6.5 - 8.1 g/dL - - 8.1  Total Bilirubin 0.3 - 1.2 mg/dL - - 3.0(H)  Alkaline Phos 38 - 126 U/L - - 65  AST 15 - 41 U/L - - 27  ALT 0 - 44 U/L - - 55(H)   Lipid Panel     Component Value Date/Time   CHOL 245 (H) 01/27/2017 1446   TRIG 196 (H) 01/27/2017 1446   HDL 33 (L) 01/27/2017 1446   CHOLHDL 7.4 (H) 01/27/2017 1446   CHOLHDL 9.7 03/12/2016 0557   VLDL UNABLE TO CALCULATE IF TRIGLYCERIDE OVER 400 mg/dL 03/14/2016 93/26/7124   LDLCALC 173 (H) 01/27/2017 1446    CBC    Component Value Date/Time   WBC 7.8 02/05/2019 1227   RBC 4.89 02/05/2019 1227   HGB 13.7 02/05/2019 1227   HCT 40.9 02/05/2019 1227   PLT 303 02/05/2019 1227   MCV 83.6 02/05/2019 1227   MCH 28.0 02/05/2019 1227   MCHC 33.5 02/05/2019 1227   RDW 15.1 02/05/2019 1227    ASSESSMENT AND PLAN:  1. Type 2 diabetes mellitus with morbid obesity (HCC) A1c is close to goal but has increased since last visit. Discussed healthy eating habits.  Encouraged him to take lunch from home to work with him to avoid eating fast foods. Encouraged him to try to get in 30 minutes of exercise 3 to 4 days a week.  Continue current dose of  Metformin and Lantus. - POCT glucose (manual entry) - POCT glycosylated hemoglobin (Hb A1C)  2. Class 3 severe obesity due to excess calories with serious comorbidity and body mass index (BMI) of 40.0 to 44.9 in adult [facility]) See #1 above.  3. Essential hypertension Not at goal but patient has not taken medicine as yet for today.  I will have him follow-up with the clinical pharmacist in 1 to 2 weeks for blood pressure recheck.  Advised to take the lisinopril before he comes to the visit.  If blood pressure is elevated on that visit, we can increase the lisinopril to 20 mg or add Norvasc.  4. Hyperlipidemia associated with type 2 diabetes mellitus (HCC) Continue atorvastatin  5. Tobacco dependence Advised to quit due to health risks associated with smoking.  Patient not ready to give a trial of quitting completely.  Less than 5 minutes spent on counseling.    Patient was given the opportunity to ask questions.  Patient verbalized understanding of the plan and was able to repeat key elements of the plan.   Orders Placed This Encounter  Procedures  . POCT glucose (manual entry)  . POCT glycosylated hemoglobin (Hb A1C)     Requested Prescriptions    No prescriptions requested or ordered in this encounter    Return in about 4 months (around 02/18/2020) for Give appointment with [facility] in 2 weeks for blood pressure recheck.  [facility], MD, FACP

## 2019-10-25 MED FILL — metFORMIN HCL 1000 MG TABS: 1000 | 30 days supply | Qty: 60 | Fill #6

## 2019-10-25 MED FILL — ATORVASTATIN CALCIUM 40 MG: 40 | 30 days supply | Qty: 30 | Fill #2

## 2019-10-25 MED FILL — LANTUS SOLOSTAR 100 UNITS/M: 100 | 30 days supply | Qty: 6 | Fill #7

## 2019-10-25 MED FILL — LISINOPRIL 10 MG TABS: 10 | 30 days supply | Qty: 30 | Fill #7

## 2019-11-01 ENCOUNTER — Encounter: Payer: Self-pay | Admitting: Pharmacist

## 2019-11-01 ENCOUNTER — Other Ambulatory Visit: Payer: Self-pay

## 2019-11-01 ENCOUNTER — Ambulatory Visit: Payer: BC Managed Care – PPO | Attending: Internal Medicine | Admitting: Pharmacist

## 2019-11-01 VITALS — BP 152/106 | HR 87

## 2019-11-01 DIAGNOSIS — I10 Essential (primary) hypertension: Secondary | ICD-10-CM | POA: Diagnosis not present

## 2019-11-01 MED ORDER — AMLODIPINE BESYLATE 5 MG PO TABS: 5.0000 mg | ORAL_TABLET | Freq: Every day | ORAL | 0 refills | Status: DC

## 2019-11-01 MED FILL — AMLODIPINE BESYLATE 5 MG TA: 5 | 30 days supply | Qty: 30 | Fill #0

## 2019-11-01 NOTE — Progress Notes (Signed)
S:    Patient arrives in no acute distress. Presents to the clinic for hypertension evaluation, counseling, and management.  Patient was referred and last seen by Primary Care Provider on 10/18/19.   Current BP Medications include:   1. Lisinopril 10 mg daily  Adherence: reports a few missed doses but no overall issues Adverse effects: possible dry cough, pt does not notice. Denies any other adverse effects.  Antihypertensives tried in the past include: none  Diet: denies adding salt to meals, eats [place] calendar or other frozen meals at home, mix of eating out/cooking at home.  Exercise: none currently. Used to hit punching bag at home for 30 min. Smoking: smokes 1-3 cigars on weekends Work: sedentary (forklift) ASCVD risk factors include:T2DM  O:  Home BP readings: none, does not own BP cuff  Vitals:   11/01/19 1039 11/01/19 1048  BP: (!) 158/98 (!) 152/106  Pulse: 75 87    Renal function: Last BMET from 02/05/19 Scr stable.  Clinical ASCVD: 10 year ASCVD risk (using age 21): 25.4%  A/P: Hypertension: - Clinic BP above goal of < 130/90 mmHg. Adherent to current medication, tolerating well. - Start amlodipine 5 mg daily  - Continue lisinopril 10 mg daily  - Lifestyle:  - Start exercise via punching bag 30 min at least 1x/week  - Add in frozen vegetables  - Limit sodium intake from frozen meals, goal < 1500 mg/day  - Consider reducing/eliminate tobacco use - Pt considering purchasing BP cuff  ASCVD: - Elevated 10 year risk d/t uncontrolled HTN, HLD, tobacco use  - On high intensity statin, overdue for repeat FLP  Total time in face-to-face counseling 30 minutes.   F/U Pharmacist Clinic Visit in 3 weeks.  Laverna Peace, PharmD PGY-1 Ambulatory Care Pharmacy Resident 11/01/2019 11:53 AM   Butch Penny, PharmD, CPP Clinical Pharmacist [facility] & [facility] [phone]

## 2019-11-21 NOTE — Progress Notes (Signed)
   S:    PCP: Dr. Laural Benes  Patient arrives in no acute distress. Presents to the clinic for hypertension evaluation, counseling, and management.  Patient was referred and last seen by Primary Care Provider on 10/18/19.  Last seen by Clinical Pharmacist on 11/01/19, amlodipine started.  Current BP Medications include:   1. Lisinopril 10 mg daily - has not taken in a few days due to forgetting 2. Amlodipine 5 mg daily - has not started yet  Adverse effects: possible dry cough, denies any other side effects Adherence: denies missed doses other than past few days of lisinopril. Does not use any remembering technique.   Antihypertensives tried in the past include: none  Diet: eating more salads either purchased or makes at home with chicken or [place], less frozen meals since last visit. denies adding salt to meals Caffeine: tea but not daily Exercise: none currently. Used to hit punching bag at home for 30 min. limited to work schedule changing.  Smoking: decreased to 2 cigars in last month (prior 1-3 on weekends) Alcohol: 4-6 standard drinks on weekends Stress: denies Sleep: denies issues NSAID use: denies  Prior to visit: - BP meds taken today: No  - Caffeine use: No  - Tobacco use: No  - Exercise: No   O:  Home BP readings: does not own BP cuff  Vitals:   11/22/19 1021  BP: (!) 142/107  Pulse: (!) 102    BMET    Component Value Date/Time   NA 131 (L) 02/05/2019 1227   NA 141 01/27/2017 1446   K 4.9 02/05/2019 1227   CL 96 (L) 02/05/2019 1227   CO2 21 (L) 02/05/2019 1227   GLUCOSE 668 (HH) 02/05/2019 1227   BUN 16 02/05/2019 1227   BUN 12 01/27/2017 1446   CREATININE 1.19 02/05/2019 1227   CREATININE 0.89 03/17/2016 1045   CALCIUM 9.3 02/05/2019 1227   GFRNONAA >60 02/05/2019 1227   GFRAA >60 02/05/2019 1227    Renal function: CrCl cannot be calculated (Patient's most recent lab result is older than the maximum 21 days allowed.).  Clinical ASCVD:  10 year  risk 25.4% using age 39  A: - Clinic BP not at goal of < 130/80 - BP improved since last visit but still elevated due to not starting amlodipine and no BP meds in past few days. Otherwise tolerating current medications - Discussed adherence techniques if further missed doses occur including using a pill box, setting alarm, reminder sticky-notes  - Reviewed BP goal, ASCVD 10 year risk, amlodipine MOA, side effects - Continue to encourage establishing exercise routine. Limit tobacco, sodium, alcohol. - Overdue for FLP, not fasting today, consider at follow up  P:  - Start amlodipine 5 mg daily (will pick up from pharmacy today) - Continue lisinopril 10 mg daily  Total time in face-to-face counseling 30 minutes.   F/U Pharmacist Clinic Visit in 4 weeks.   Laverna Peace, PharmD PGY-1 Ambulatory Care Pharmacy Resident 11/22/2019 2:06 PM

## 2019-11-22 ENCOUNTER — Ambulatory Visit: Payer: BC Managed Care – PPO | Attending: Family Medicine | Admitting: Pharmacist

## 2019-11-22 ENCOUNTER — Other Ambulatory Visit: Payer: Self-pay | Admitting: Internal Medicine

## 2019-11-22 ENCOUNTER — Other Ambulatory Visit: Payer: Self-pay

## 2019-11-22 ENCOUNTER — Encounter: Payer: Self-pay | Admitting: Pharmacist

## 2019-11-22 DIAGNOSIS — E669 Obesity, unspecified: Secondary | ICD-10-CM

## 2019-11-22 DIAGNOSIS — E1169 Type 2 diabetes mellitus with other specified complication: Secondary | ICD-10-CM

## 2019-11-22 MED ORDER — ATORVASTATIN CALCIUM 40 MG PO TABS: 40.0000 mg | ORAL_TABLET | Freq: Every day | ORAL | 2 refills | Status: DC

## 2019-11-22 MED ORDER — METFORMIN HCL 1000 MG PO TABS: 1000.0000 mg | ORAL_TABLET | Freq: Two times a day (BID) | ORAL | 2 refills | Status: DC

## 2019-11-22 MED ORDER — OMEPRAZOLE 20 MG PO CPDR: 20.0000 mg | DELAYED_RELEASE_CAPSULE | Freq: Every day | ORAL | 2 refills | Status: DC | PRN

## 2019-11-22 MED FILL — AMLODIPINE BESYLATE 5 MG TA: 5 | 30 days supply | Qty: 30 | Fill #0

## 2019-11-22 MED FILL — LISINOPRIL 10 MG TABS: 10 | 30 days supply | Qty: 30 | Fill #8

## 2019-11-22 MED FILL — metFORMIN HCL 1000 MG TABS: 1000 | 30 days supply | Qty: 60 | Fill #0

## 2019-11-22 MED FILL — TRUEPLUS PEN NDL 31GX5/16: 31G X 8 MM | 25 days supply | Qty: 100 | Fill #0

## 2019-11-22 MED FILL — ATORVASTATIN CALCIUM 40 MG: 40 | 30 days supply | Qty: 30 | Fill #0

## 2019-11-22 MED FILL — LANTUS SOLOSTAR 100 UNITS/M: 100 | 30 days supply | Qty: 6 | Fill #8

## 2019-11-22 MED FILL — OMEPRAZOLE 20 MG CAP: 20 | 30 days supply | Qty: 30 | Fill #0

## 2019-12-10 MED FILL — TRUEPLUS PEN NDL 31GX5/16: 31G X 8 MM | 25 days supply | Qty: 100 | Fill #0

## 2019-12-23 ENCOUNTER — Other Ambulatory Visit: Payer: Self-pay | Admitting: Internal Medicine

## 2019-12-23 MED FILL — LISINOPRIL 10 MG TABS: 10 | 30 days supply | Qty: 30 | Fill #9

## 2019-12-23 MED FILL — AMLODIPINE BESYLATE 5 MG TA: 5 | 30 days supply | Qty: 30 | Fill #0

## 2019-12-23 MED FILL — ATORVASTATIN CALCIUM 40 MG: 40 | 30 days supply | Qty: 30 | Fill #3

## 2019-12-23 MED FILL — LANTUS SOLOSTAR 100 UNITS/M: 100 | 30 days supply | Qty: 6 | Fill #9

## 2019-12-23 MED FILL — OMEPRAZOLE 20 MG CAP: 20 | 30 days supply | Qty: 30 | Fill #1

## 2019-12-23 MED FILL — METFORMIN HCL 1000 MG TABS: 1000 | 30 days supply | Qty: 60 | Fill #1

## 2020-01-24 MED FILL — AMLODIPINE BESYLATE 5 MG TA: 5 | 30 days supply | Qty: 30 | Fill #1

## 2020-01-24 MED FILL — LANTUS SOLOSTAR 100 UNITS/M: 100 | 30 days supply | Qty: 6 | Fill #10

## 2020-01-24 MED FILL — LISINOPRIL 10 MG TABS: 10 | 30 days supply | Qty: 30 | Fill #10

## 2020-01-24 MED FILL — METFORMIN HCL 1000 MG TABS: 1000 | 30 days supply | Qty: 60 | Fill #2

## 2020-01-24 MED FILL — ATORVASTATIN CALCIUM 40 MG: 40 | 30 days supply | Qty: 30 | Fill #4

## 2020-01-24 MED FILL — OMEPRAZOLE 20 MG CAP: 20 | 30 days supply | Qty: 30 | Fill #2

## 2020-02-24 ENCOUNTER — Ambulatory Visit: Payer: BC Managed Care – PPO | Attending: Internal Medicine | Admitting: Internal Medicine

## 2020-02-24 ENCOUNTER — Other Ambulatory Visit: Payer: Self-pay

## 2020-02-24 DIAGNOSIS — E785 Hyperlipidemia, unspecified: Secondary | ICD-10-CM

## 2020-02-24 DIAGNOSIS — E1169 Type 2 diabetes mellitus with other specified complication: Secondary | ICD-10-CM | POA: Diagnosis not present

## 2020-02-24 DIAGNOSIS — Z23 Encounter for immunization: Secondary | ICD-10-CM | POA: Diagnosis not present

## 2020-02-24 DIAGNOSIS — E669 Obesity, unspecified: Secondary | ICD-10-CM

## 2020-02-24 DIAGNOSIS — I10 Essential (primary) hypertension: Secondary | ICD-10-CM

## 2020-02-24 MED ORDER — LANTUS SOLOSTAR 100 UNIT/ML ~~LOC~~ SOPN: 20.0000 [IU] | PEN_INJECTOR | Freq: Every day | SUBCUTANEOUS | 11 refills | Status: DC

## 2020-02-24 MED ORDER — AMLODIPINE BESYLATE 5 MG PO TABS: 5.0000 mg | ORAL_TABLET | Freq: Every day | ORAL | 1 refills | Status: DC

## 2020-02-24 MED ORDER — METFORMIN HCL 1000 MG PO TABS: 1000.0000 mg | ORAL_TABLET | Freq: Two times a day (BID) | ORAL | 1 refills | Status: DC

## 2020-02-24 MED ORDER — LISINOPRIL 10 MG PO TABS: 10.0000 mg | ORAL_TABLET | Freq: Every day | ORAL | 3 refills | Status: DC

## 2020-02-24 MED ORDER — ATORVASTATIN CALCIUM 40 MG PO TABS: 40.0000 mg | ORAL_TABLET | Freq: Every day | ORAL | 1 refills | Status: DC

## 2020-02-24 NOTE — Progress Notes (Signed)
Virtual Visit via Telephone Note  I connected with Kharson Huizar on 02/24/20 at 10:31 a.m by telephone and verified that I am speaking with the correct person using two identifiers.  Location: Patient: home Provider: office  Only the patient, myself and my CMA Carolynne Edouard participated in this encounter. I discussed the limitations, risks, security and privacy concerns of performing an evaluation and management service by telephone and the availability of in person appointments. I also discussed with the patient that there may be a patient responsible charge related to this service. The patient expressed understanding and agreed to proceed.   History of Present Illness: Patient with history ofDM, HTN,HL,obesity,tobacco dependence(cigars).  Last seen 09/2019.  HTN:  BP was elev on last visit.  Saw clinical pharmacist since last.  Norvasc added.  Reports compliance with Norvasc and Lisinopril. No device to check BP. Limits salt in foods No CP/SOB/LE/dizziness/HA  DM:  Checks BS daily before lunch.  Gives range of 110-120. Reports he is doing better with eating habits.  Not eating as much and not as much fast foods as before. Not getting in much exercise.  Working a lot. Reports compliance with Metformin and taking LAntus 16 units at nights   HL:  Taking tolerating Lipitor  Outpatient Encounter Medications as of 02/24/2020  Medication Sig  . amLODipine (NORVASC) 5 MG tablet TAKE 1 TABLET (5 MG TOTAL) BY MOUTH DAILY.  Marland Kitchen atorvastatin (LIPITOR) 40 MG tablet Take 1 tablet (40 mg total) by mouth daily.  . Insulin Glargine (LANTUS SOLOSTAR) 100 UNIT/ML Solostar Pen Inject 20 Units into the skin daily.  . Insulin Pen Needle (PEN NEEDLES) 31G X 8 MM MISC UAD  . lisinopril (ZESTRIL) 10 MG tablet Take 1 tablet (10 mg total) by mouth daily.  . metFORMIN (GLUCOPHAGE) 1000 MG tablet Take 1 tablet (1,000 mg total) by mouth 2 (two) times daily.  Marland Kitchen omeprazole (PRILOSEC) 20 MG capsule Take 1 capsule  (20 mg total) by mouth daily as needed (heartburn).   No facility-administered encounter medications on file as of 02/24/2020.    Observations/Objective: Results for orders placed or performed in visit on 10/18/19  POCT glucose (manual entry)  Result Value Ref Range   POC Glucose 131 (A) 70 - 99 mg/dl  POCT glycosylated hemoglobin (Hb A1C)  Result Value Ref Range   Hemoglobin A1C     HbA1c POC (<> result, manual entry)     HbA1c, POC (prediabetic range)     HbA1c, POC (controlled diabetic range) 7.1 (A) 0.0 - 7.0 %     Assessment and Plan: 1. Diabetes mellitus type 2 in obese [employer]) Reported blood sugars are at goal.  Continue Metformin and Lantus.  Commended him on trying to change his eating habits.  Encouraged him to move as much as he can with the goal of getting in about 150 minutes/week total of moderate intensity exercise. - metFORMIN (GLUCOPHAGE) 1000 MG tablet; Take 1 tablet (1,000 mg total) by mouth 2 (two) times daily.  Dispense: 180 tablet; Refill: 1 - insulin glargine (LANTUS SOLOSTAR) 100 UNIT/ML Solostar Pen; Inject 20 Units into the skin daily.  Dispense: 15 mL; Refill: 11 - atorvastatin (LIPITOR) 40 MG tablet; Take 1 tablet (40 mg total) by mouth daily.  Dispense: 90 tablet; Refill: 1 - CBC; Future - Comprehensive metabolic panel; Future - Microalbumin / creatinine urine ratio; Future - Hemoglobin A1c; Future  2. Essential hypertension - lisinopril (ZESTRIL) 10 MG tablet; Take 1 tablet (10 mg total) by mouth daily.  Dispense: 90 tablet; Refill: 3 - amLODipine (NORVASC) 5 MG tablet; Take 1 tablet (5 mg total) by mouth daily.  Dispense: 90 tablet; Refill: 1  3. Hyperlipidemia associated with type 2 diabetes mellitus (HCC) - Lipid panel; Future  4. Need for influenza vaccination Patient will come to get his flu shot through our clinical pharmacist.   Follow Up Instructions: 4 mths   I discussed the assessment and treatment plan with the patient. The patient  was provided an opportunity to ask questions and all were answered. The patient agreed with the plan and demonstrated an understanding of the instructions.   The patient was advised to call back or seek an in-person evaluation if the symptoms worsen or if the condition fails to improve as anticipated.  I provided 10 minutes of non-face-to-face time during this encounter.   Jonah Blue, MD

## 2020-02-25 MED FILL — LANTUS SOLOSTAR 100 UNITS/M: 100 | 30 days supply | Qty: 6 | Fill #0

## 2020-02-25 MED FILL — ATORVASTATIN CALCIUM 40 MG: 40 | 30 days supply | Qty: 30 | Fill #0

## 2020-02-25 MED FILL — LISINOPRIL 10 MG TABS: 10 | 30 days supply | Qty: 30 | Fill #0

## 2020-02-25 MED FILL — AMLODIPINE BESYLATE 5 MG TA: 5 | 30 days supply | Qty: 30 | Fill #0

## 2020-02-25 MED FILL — METFORMIN HCL 1000 MG TABS: 1000 | 30 days supply | Qty: 60 | Fill #0

## 2020-02-26 ENCOUNTER — Other Ambulatory Visit: Payer: Self-pay | Admitting: Internal Medicine

## 2020-02-27 MED FILL — OMEPRAZOLE 20 MG CAP: 20 | 30 days supply | Qty: 30 | Fill #0

## 2020-03-02 ENCOUNTER — Ambulatory Visit: Payer: BC Managed Care – PPO | Attending: Internal Medicine

## 2020-03-02 ENCOUNTER — Other Ambulatory Visit: Payer: Self-pay

## 2020-03-02 DIAGNOSIS — E669 Obesity, unspecified: Secondary | ICD-10-CM | POA: Diagnosis not present

## 2020-03-02 DIAGNOSIS — E1169 Type 2 diabetes mellitus with other specified complication: Secondary | ICD-10-CM | POA: Diagnosis not present

## 2020-03-02 DIAGNOSIS — E785 Hyperlipidemia, unspecified: Secondary | ICD-10-CM | POA: Diagnosis not present

## 2020-03-03 LAB — COMPREHENSIVE METABOLIC PANEL
ALT: 48 IU/L — ABNORMAL HIGH (ref 0–44)
AST: 23 IU/L (ref 0–40)
Albumin/Globulin Ratio: 1.5 (ref 1.2–2.2)
Albumin: 4.8 g/dL (ref 4.0–5.0)
Alkaline Phosphatase: 115 IU/L (ref 44–121)
BUN/Creatinine Ratio: 17 (ref 9–20)
BUN: 16 mg/dL (ref 6–20)
Bilirubin Total: 0.9 mg/dL (ref 0.0–1.2)
CO2: 24 mmol/L (ref 20–29)
Calcium: 9.6 mg/dL (ref 8.7–10.2)
Chloride: 95 mmol/L — ABNORMAL LOW (ref 96–106)
Creatinine, Ser: 0.96 mg/dL (ref 0.76–1.27)
GFR calc Af Amer: 115 mL/min/{1.73_m2} (ref >59)
GFR calc non Af Amer: 99 mL/min/{1.73_m2} (ref >59)
Globulin, Total: 3.1 g/dL (ref 1.5–4.5)
Glucose: 349 mg/dL — ABNORMAL HIGH (ref 65–99)
Potassium: 4.2 mmol/L (ref 3.5–5.2)
Sodium: 135 mmol/L (ref 134–144)
Total Protein: 7.9 g/dL (ref 6.0–8.5)

## 2020-03-03 LAB — HEMOGLOBIN A1C
Est. average glucose Bld gHb Est-mCnc: 275 mg/dL
Hgb A1c MFr Bld: 11.2 % — ABNORMAL HIGH (ref 4.8–5.6)

## 2020-03-03 LAB — CBC
Hematocrit: 44.9 % (ref 37.5–51.0)
Hemoglobin: 14.6 g/dL (ref 13.0–17.7)
MCH: 26.3 pg — ABNORMAL LOW (ref 26.6–33.0)
MCHC: 32.5 g/dL (ref 31.5–35.7)
MCV: 81 fL (ref 79–97)
Platelets: 240 10*3/uL (ref 150–450)
RBC: 5.55 x10E6/uL (ref 4.14–5.80)
RDW: 15.8 % — ABNORMAL HIGH (ref 11.6–15.4)
WBC: 9.6 10*3/uL (ref 3.4–10.8)

## 2020-03-03 LAB — LIPID PANEL
Chol/HDL Ratio: 6.9 ratio — ABNORMAL HIGH (ref 0.0–5.0)
Cholesterol, Total: 201 mg/dL — ABNORMAL HIGH (ref 100–199)
HDL: 29 mg/dL — ABNORMAL LOW (ref >39)
LDL Chol Calc (NIH): 66 mg/dL (ref 0–99)
Triglycerides: 690 mg/dL (ref 0–149)
VLDL Cholesterol Cal: 106 mg/dL — ABNORMAL HIGH (ref 5–40)

## 2020-03-03 LAB — MICROALBUMIN / CREATININE URINE RATIO
Creatinine, Urine: 63.8 mg/dL
Microalb/Creat Ratio: 23 mg/g creat (ref 0–29)
Microalbumin, Urine: 14.4 ug/mL

## 2020-03-04 ENCOUNTER — Telehealth (INDEPENDENT_AMBULATORY_CARE_PROVIDER_SITE_OTHER): Payer: Self-pay

## 2020-03-04 NOTE — Progress Notes (Signed)
Let patient know that his A1c is 11.2 which is significantly increased from April of this year when it was 6.8.  This means his diabetes is significantly uncontrolled.  Please confirm that he is still taking Metformin twice a day and Lantus insulin 16 units daily consistently.  If he has been doing so, I recommend increasing the Lantus insulin to 20 units.  Advised to check blood sugars twice a day before breakfast and dinner.  Give appointment with our clinical pharmacist in 2 weeks and request that he bring his blood sugar log with him.  Underscore the importance of healthy eating habits and regular exercise. -Triglyceride levels are elevated at 690 with goal being less than 150.  High triglyceride increases the risk for pancreatitis.  Please confirm that he has been taking atorvastatin consistently.  If he has, we will need to add a low-dose of another cholesterol-lowering medication called TriCor.  Please let me know so that I can send prescription. -Kidney function is good.-Mild elevation in one of his liver enzymes but has improved compared to 1 year ago.  We will continue to monitor. -Blood cell counts including red, white blood cells and platelet counts are normal.

## 2020-03-04 NOTE — Telephone Encounter (Signed)
Contacted pt to go over lab results pt is aware and doesn't have any questions or concerns   Dr. Laural Benes pt is aware of medication change and he is okay with adding the cholesterol medication. Pt would like rx sent to [facility]

## 2020-03-18 ENCOUNTER — Other Ambulatory Visit: Payer: Self-pay

## 2020-03-18 ENCOUNTER — Encounter: Payer: Self-pay | Admitting: Pharmacist

## 2020-03-18 ENCOUNTER — Ambulatory Visit: Payer: BC Managed Care – PPO | Attending: Internal Medicine | Admitting: Pharmacist

## 2020-03-18 VITALS — BP 127/77 | HR 90

## 2020-03-18 DIAGNOSIS — Z23 Encounter for immunization: Secondary | ICD-10-CM | POA: Diagnosis not present

## 2020-03-18 DIAGNOSIS — I10 Essential (primary) hypertension: Secondary | ICD-10-CM

## 2020-03-18 NOTE — Progress Notes (Signed)
   S:    PCP: Dr. Laural Benes  Patient arrives in good spirits. Presents to the clinic for hypertension evaluation, counseling, and management. Patient was referred and last seen by Primary Care Provider on 02/24/2020.   Medication adherence reported. He has taken his medications this morning.  Current BP Medications include:  Amlodipine 5 mg daily, lisinopril 10 mg daily   Dietary habits include: compliant with salt restriction; denies excessive caffeine intake  Exercise habits include: none  Family / Social history:  - FHx:  T2DM -Tobacco: current some day smoker  - Alcohol: reports weekend use   O:  Vitals:   03/18/20 1043  BP: 127/77  Pulse: 90   Home BP readings: No device to check at home   Last 3 Office BP readings: BP Readings from Last 3 Encounters:  03/18/20 127/77  11/22/19 (!) 142/107  11/01/19 (!) 152/106    BMET    Component Value Date/Time   NA 135 03/02/2020 1011   K 4.2 03/02/2020 1011   CL 95 (L) 03/02/2020 1011   CO2 24 03/02/2020 1011   GLUCOSE 349 (H) 03/02/2020 1011   GLUCOSE 668 (HH) 02/05/2019 1227   BUN 16 03/02/2020 1011   CREATININE 0.96 03/02/2020 1011   CREATININE 0.89 03/17/2016 1045   CALCIUM 9.6 03/02/2020 1011   GFRNONAA 99 03/02/2020 1011   GFRAA 115 03/02/2020 1011    Renal function: CrCl cannot be calculated (Unknown ideal weight.).  Clinical ASCVD: No  The ASCVD Risk score Denman George DC Jr., et al., 2013) failed to calculate for the following reasons:   The 2013 ASCVD risk score is only valid for ages 41 to 48  A/P: Hypertension longstanding currently controlled on current medications. BP Goal = < 130/80 mmHg. Medication adherence reported.  -Continued current regimen.  -Counseled on lifestyle modifications for blood pressure control including reduced dietary sodium, increased exercise, adequate sleep. -HM: gave flu shot  Results reviewed and written information provided.   Total time in face-to-face counseling 30 minutes.    F/U Clinic Visit with PCP.    Butch Penny, PharmD, Patsy Baltimore, CPP Clinical Pharmacist [facility] & [facility] [phone]

## 2020-03-26 ENCOUNTER — Other Ambulatory Visit: Payer: Self-pay | Admitting: Internal Medicine

## 2020-04-17 ENCOUNTER — Other Ambulatory Visit: Payer: Self-pay | Admitting: Internal Medicine

## 2020-04-30 ENCOUNTER — Other Ambulatory Visit: Payer: Self-pay | Admitting: Internal Medicine

## 2020-04-30 NOTE — Telephone Encounter (Signed)
   Notes to clinic Has upcoming appt, unsure as to Omeprazole DR vs omeprazole. Please assess.

## 2020-05-18 ENCOUNTER — Other Ambulatory Visit: Payer: Self-pay | Admitting: Internal Medicine

## 2020-05-18 ENCOUNTER — Other Ambulatory Visit: Payer: Self-pay

## 2020-05-18 ENCOUNTER — Encounter: Payer: Self-pay | Admitting: Internal Medicine

## 2020-05-18 ENCOUNTER — Ambulatory Visit: Payer: Self-pay | Attending: Internal Medicine | Admitting: Internal Medicine

## 2020-05-18 ENCOUNTER — Ambulatory Visit (HOSPITAL_BASED_OUTPATIENT_CLINIC_OR_DEPARTMENT_OTHER): Payer: Self-pay | Admitting: Pharmacist

## 2020-05-18 VITALS — BP 138/94 | HR 92 | Resp 16 | Wt 276.4 lb

## 2020-05-18 DIAGNOSIS — E1169 Type 2 diabetes mellitus with other specified complication: Secondary | ICD-10-CM

## 2020-05-18 DIAGNOSIS — F17200 Nicotine dependence, unspecified, uncomplicated: Secondary | ICD-10-CM

## 2020-05-18 DIAGNOSIS — Z7189 Other specified counseling: Secondary | ICD-10-CM

## 2020-05-18 DIAGNOSIS — E785 Hyperlipidemia, unspecified: Secondary | ICD-10-CM

## 2020-05-18 DIAGNOSIS — E669 Obesity, unspecified: Secondary | ICD-10-CM

## 2020-05-18 DIAGNOSIS — I10 Essential (primary) hypertension: Secondary | ICD-10-CM

## 2020-05-18 LAB — POCT GLYCOSYLATED HEMOGLOBIN (HGB A1C): HbA1c, POC (controlled diabetic range): 11.9 % — AB (ref 0.0–7.0)

## 2020-05-18 LAB — GLUCOSE, POCT (MANUAL RESULT ENTRY): POC Glucose: 223 mg/dl — AB (ref 70–99)

## 2020-05-18 MED ORDER — TRULICITY 0.75 MG/0.5ML ~~LOC~~ SOAJ
0.7500 mg | SUBCUTANEOUS | 6 refills | Status: DC
Start: 2020-05-18 — End: 2020-06-15

## 2020-05-18 MED ORDER — AMLODIPINE BESYLATE 10 MG PO TABS: 10.0000 mg | ORAL_TABLET | Freq: Every day | ORAL | 2 refills | Status: DC

## 2020-05-18 MED FILL — !TRULICITY 0.75 MG/0.5 ML P: 0.75 | 28 days supply | Qty: 2 | Fill #0

## 2020-05-18 NOTE — Progress Notes (Signed)
Patient was educated on the use of the Trulicity pen. Reviewed necessary supplies and operation of the pen. Also reviewed goal blood glucose levels. Patient was able to demonstrate use. All questions and concerns were addressed.  Butch Penny, PharmD, Patsy [place], CPP Clinical Pharmacist [facility] & [facility] [phone]

## 2020-05-18 NOTE — Progress Notes (Signed)
Patient ID: William Martin, male    DOB: Dec 06, 1980  MRN: [id number]  CC: Diabetes and Hypertension   Subjective: [name] is a 40 y.o. male who presents for chronic ds management His concerns today include:  Patient with history ofDM, HTN,HL,obesity,tobacco dependence(cigars).   DIABETES TYPE 2/Obesity Last A1C:   Results for orders placed or performed in visit on 05/18/20  POCT glucose (manual entry)  Result Value Ref Range   POC Glucose 223 (A) 70 - 99 mg/dl  POCT glycosylated hemoglobin (Hb A1C)  Result Value Ref Range   Hemoglobin A1C     HbA1c POC (<> result, manual entry)     HbA1c, POC (prediabetic range)     HbA1c, POC (controlled diabetic range) 11.9 (A) 0.0 - 7.0 %    Med Adherence:  [x]  Yes.  He is on Lantus 20 units and Metformin 1 g twice a day. Medication side effects:  []  Yes    [x]  No Home Monitoring?  [x]  Yes  -but not often Home glucose results range: Diet Adherence: []  Yes    [x]  No -"work in progress." Exercise: [x]  Yes -increased.  Doing a lot more walking at work.  Does audits for construction Hypoglycemic episodes?: []  Yes    []  No Numbness of the feet? []  Yes    [x]  No Retinopathy hx? []  Yes    [x]  No Last eye exam:  Comments:   HYPERTENSION Currently taking: see medication list Med Adherence: [x]  Yes on amlodipine and lisinopril. Medication side effects: []  Yes    [x]  No Adherence with salt restriction: [x]  Yes    []  No Home Monitoring?: []  Yes    [x]  No Monitoring Frequency: []  Yes    []  No Home BP results range: []  Yes    []  No SOB? []  Yes    [x]  No Chest Pain?: []  Yes    [x]  No Leg swelling?: []  Yes    [x]  No Headaches?: []  Yes    [x]  No Dizziness? []  Yes    [x]  No Comments:   HL:  Taking and tolerating Lipitor.  Triglyceride level on blood test done back in November was greater than 600.  Tob dep:  Occasional cigar Q 2 wks.  Does not want to quit Patient Active Problem List   Diagnosis Date Noted  . Hyperlipidemia  associated with type 2 diabetes mellitus (HCC) 03/04/2019  . Tobacco dependence 07/31/2018  . Immunization due 01/27/2017  . Essential hypertension 01/27/2017  . Former cigar smoker 01/27/2017  . Obesity 03/14/2016  . Hyperlipidemia 03/14/2016  . Hyperlipidemia due to type 2 diabetes mellitus (HCC) 03/14/2016  . New onset type 2 diabetes mellitus (HCC) 03/11/2016  . GERD (gastroesophageal reflux disease) 03/11/2016     Current Outpatient Medications on File Prior to Visit  Medication Sig Dispense Refill  . amLODipine (NORVASC) 5 MG tablet Take 1 tablet (5 mg total) by mouth daily. 90 tablet 1  . atorvastatin (LIPITOR) 40 MG tablet Take 1 tablet (40 mg total) by mouth daily. 90 tablet 1  . B-D INS SYR ULTRAFINE 1CC/30G 30G X 1/2" 1 ML MISC USE AS DIRECTED TWICE A DAY 100 each 3  . insulin glargine (LANTUS SOLOSTAR) 100 UNIT/ML Solostar Pen Inject 20 Units into the skin daily. 15 mL 11  . Insulin Pen Needle (PEN NEEDLES) 31G X 8 MM MISC UAD 100 each 6  . lisinopril (ZESTRIL) 10 MG tablet Take 1 tablet (10 mg total) by mouth daily. 90 tablet 3  .  metFORMIN (GLUCOPHAGE) 1000 MG tablet Take 1 tablet (1,000 mg total) by mouth 2 (two) times daily. 180 tablet 1  . omeprazole (PRILOSEC) 20 MG capsule TAKE 1 CAPSULE EVERY DAY AS NEEDED FOR HEARTBURN 30 capsule 1   No current facility-administered medications on file prior to visit.    Allergies  Allergen Reactions  . Bee Venom Swelling    Social History   Socioeconomic History  . Marital status: Single    Spouse name: Not on file  . Number of children: Not on file  . Years of education: Not on file  . Highest education level: Not on file  Occupational History  . Not on file  Tobacco Use  . Smoking status: Current Some Day Smoker    Types: Cigars  . Smokeless tobacco: Never Used  . Tobacco comment: 2 cigars/month  Vaping Use  . Vaping Use: Never used  Substance and Sexual Activity  . Alcohol use: Yes    Alcohol/week: 6.0  standard drinks    Types: 6 Standard drinks or equivalent per week    Comment: on weekends  . Drug use: No  . Sexual activity: Not on file  Other Topics Concern  . Not on file  Social History Narrative  . Not on file   Social Determinants of Health   Financial Resource Strain: Not on file  Food Insecurity: Not on file  Transportation Needs: Not on file  Physical Activity: Not on file  Stress: Not on file  Social Connections: Not on file  Intimate Partner Violence: Not on file    Family History  Problem Relation Age of Onset  . Diabetes type II Mother     No past surgical history on file.  ROS: Review of Systems Negative except as stated above  PHYSICAL EXAM: BP (!) 165/98   Pulse 92   Resp 16   Wt 276 lb 6.4 oz (125.4 kg)   SpO2 96%   BMI 39.66 kg/m   Wt Readings from Last 3 Encounters:  05/18/20 276 lb 6.4 oz (125.4 kg)  10/18/19 (!) 292 lb 9.6 oz (132.7 kg)  07/19/19 291 lb (132 kg)   Physical Exam  General appearance - alert, well appearing, middle-aged African-American male and in no distress Mental status - normal mood, behavior, speech, dress, motor activity, and thought processes Neck - supple, no significant adenopathy Chest - clear to auscultation, no wheezes, rales or rhonchi, symmetric air entry Heart - normal rate, regular rhythm, normal S1, S2, no murmurs, rubs, clicks or gallops Extremities - peripheral pulses normal, no pedal edema, no clubbing or cyanosis  CMP Latest Ref Rng & Units 03/02/2020 02/05/2019 01/31/2019  Glucose 65 - 99 mg/dL 865(H) 846(NG) 295(M)  BUN 6 - 20 mg/dL 16 16 15   Creatinine 0.76 - 1.27 mg/dL 8.41 3.24  Sodium 134 - 144 mmol/L 135 131(L) 130(L)  Potassium 3.5 - 5.2 mmol/L 4.2 4.9 4.2  Chloride 96 - 106 mmol/L 95(L) 96(L) 95(L)  CO2 20 - 29 mmol/L 24 21(L) 21(L)  Calcium 8.7 - 10.2 mg/dL 9.6 9.3 8.9  Total Protein 6.0 - 8.5 g/dL 7.9 - -  Total Bilirubin 0.0 - 1.2 mg/dL 0.9 - -  Alkaline Phos 44 - 121 IU/L 115 -  -  AST 0 - 40 IU/L 23 - -  ALT 0 - 44 IU/L 48(H) - -   Lipid Panel     Component Value Date/Time   CHOL 201 (H) 03/02/2020 1011   TRIG 690 (HH) 03/02/2020  1011   HDL 29 (L) 03/02/2020 1011   CHOLHDL 6.9 (H) 03/02/2020 1011   CHOLHDL 9.7 03/12/2016 0557   VLDL UNABLE TO CALCULATE IF TRIGLYCERIDE OVER 400 mg/dL 67/67/2094 7096   LDLCALC 66 03/02/2020 1011    CBC    Component Value Date/Time   WBC 9.6 03/02/2020 1011   WBC 7.8 02/05/2019 1227   RBC 5.55 03/02/2020 1011   RBC 4.89 02/05/2019 1227   HGB 14.6 03/02/2020 1011   HCT 44.9 03/02/2020 1011   PLT 240 03/02/2020 1011   MCV 81 03/02/2020 1011   MCH 26.3 (L) 03/02/2020 1011   MCH 28.0 02/05/2019 1227   MCHC 32.5 03/02/2020 1011   MCHC 33.5 02/05/2019 1227   RDW 15.8 (H) 03/02/2020 1011    ASSESSMENT AND PLAN: 1. Diabetes mellitus type 2 in obese (HCC) Not at goal.  A1c not improved.  He reports compliance with medications.  I recommend adding Trulicity.  Went over how Trulicity work and benefits of helping to lower blood sugars but also will accomplish some weight loss.  Advised patient of possible side effects including nausea/vomiting and pancreatitis.  If he develops any vomiting or upper abdominal pain, patient told to stop the medicine and give me a call. -Advised to check blood sugars at least once a day before breakfast and bring log with him in 1 month to see the clinical pharmacist.  Healthy eating habits discussed and encouraged. - POCT glucose (manual entry) - POCT glycosylated hemoglobin (Hb A1C) - Dulaglutide (TRULICITY) 0.75 MG/0.5ML SOPN; Inject 0.75 mg into the skin once a week.  Dispense: 2 mL; Refill: 6  2. Essential hypertension Not at goal.  Increase amlodipine to 10 mg daily - amLODipine (NORVASC) 10 MG tablet; Take 1 tablet (10 mg total) by mouth daily.  Dispense: 90 tablet; Refill: 2  3. Hyperlipidemia associated with type 2 diabetes mellitus (HCC) Continue atorvastatin.  Recheck lipid profile  today.  He is fasting.  If triglycerides still elevated I told him that we will likely need to start another medication called TriCor.  Encourage healthy eating habits - Lipid panel  4. Tobacco dependence Advised to quit.  He is aware of health risks associated with smoking.  Patient not ready to give a trial of quitting.  Less than 5 minutes spent on counseling.     Patient was given the opportunity to ask questions.  Patient verbalized understanding of the plan and was able to repeat key elements of the plan.   Orders Placed This Encounter  Procedures  . POCT glucose (manual entry)  . POCT glycosylated hemoglobin (Hb A1C)     Requested Prescriptions    No prescriptions requested or ordered in this encounter    No follow-ups on file.  Jonah Blue, MD, FACP

## 2020-05-18 NOTE — Patient Instructions (Signed)
Your blood pressure is not at goal.  Goal is 130/80 or lower.  We have increased the amlodipine to 10 mg daily.  Your diabetes is not well controlled.  Continue Lantus 20 units daily and Metformin.  We have added the medication called Trulicity.  You will do the Trulicity injection once a week as discussed.  Please check your blood sugar at least once a day before breakfast and bring in your readings with you in 1 month to see the clinical pharmacist for recheck.

## 2020-05-19 LAB — LIPID PANEL
Chol/HDL Ratio: 4.5 ratio (ref 0.0–5.0)
Cholesterol, Total: 175 mg/dL (ref 100–199)
HDL: 39 mg/dL — ABNORMAL LOW (ref >39)
LDL Chol Calc (NIH): 113 mg/dL — ABNORMAL HIGH (ref 0–99)
Triglycerides: 125 mg/dL (ref 0–149)
VLDL Cholesterol Cal: 23 mg/dL (ref 5–40)

## 2020-05-19 NOTE — Progress Notes (Signed)
Let patient know that his cholesterol level and particularly his triglyceride level has significantly improved.  His triglyceride level went from 690 down to 125.  His LDL cholesterol however is still elevated.  The LDL cholesterol is the bad cholesterol which can increase the risk for heart attack and strokes.  The goal is to be less than 70.  He is currently at 113.  Please inquire whether he has been taking the atorvastatin consistently.  If he has been taking the atorvastatin 40 mg daily then we will need to increase the dose to 60 mg daily which means he will take 1-1/2 tablets a day.  Please let me know his response so that I know whether to send an updated prescription to the pharmacy.

## 2020-05-21 ENCOUNTER — Other Ambulatory Visit: Payer: Self-pay | Admitting: Internal Medicine

## 2020-05-21 ENCOUNTER — Telehealth: Payer: Self-pay

## 2020-05-21 DIAGNOSIS — E1169 Type 2 diabetes mellitus with other specified complication: Secondary | ICD-10-CM

## 2020-05-21 MED ORDER — ATORVASTATIN CALCIUM 40 MG PO TABS
60.0000 mg | ORAL_TABLET | Freq: Every day | ORAL | 1 refills | Status: DC
Start: 2020-05-21 — End: 2020-05-21

## 2020-05-21 NOTE — Addendum Note (Signed)
Addended by: Jonah Blue B on: 05/21/2020 09:19 PM   Modules accepted: Orders

## 2020-05-21 NOTE — Telephone Encounter (Signed)
Contacted pt to go over lab results pt is aware and doesn't have any questions or concerns    Pt states he has been taking the medication daily pt is aware to increase medication(atorvastatin) to 60mg  a day and has been aware that an updated rx will be sent to the pharmacy

## 2020-05-22 MED FILL — ATORVASTATIN 40 MG TABLET: 40 | 30 days supply | Qty: 45 | Fill #0

## 2020-06-15 ENCOUNTER — Ambulatory Visit: Payer: Self-pay | Attending: Internal Medicine | Admitting: Pharmacist

## 2020-06-15 ENCOUNTER — Other Ambulatory Visit: Payer: Self-pay | Admitting: Internal Medicine

## 2020-06-15 ENCOUNTER — Other Ambulatory Visit: Payer: Self-pay

## 2020-06-15 ENCOUNTER — Encounter: Payer: Self-pay | Admitting: Pharmacist

## 2020-06-15 DIAGNOSIS — I10 Essential (primary) hypertension: Secondary | ICD-10-CM

## 2020-06-15 DIAGNOSIS — E1169 Type 2 diabetes mellitus with other specified complication: Secondary | ICD-10-CM

## 2020-06-15 DIAGNOSIS — E669 Obesity, unspecified: Secondary | ICD-10-CM

## 2020-06-15 LAB — GLUCOSE, POCT (MANUAL RESULT ENTRY): POC Glucose: 267 mg/dl — AB (ref 70–99)

## 2020-06-15 MED ORDER — AMLODIPINE BESYLATE 10 MG PO TABS: 10.0000 mg | ORAL_TABLET | Freq: Every day | ORAL | 2 refills | Status: DC

## 2020-06-15 MED ORDER — TRULICITY 1.5 MG/0.5ML ~~LOC~~ SOAJ: 1.5000 mg | SUBCUTANEOUS | 2 refills | Status: DC

## 2020-06-15 MED FILL — TRULICITY 1.5 MG/0.5 ML PEN: 1.5 | 28 days supply | Qty: 2 | Fill #0

## 2020-06-15 NOTE — Progress Notes (Signed)
S:    PCP: Dr. Laural Benes  Patient arrives in good spirits.  Presents for diabetes evaluation, education, and management. Patient was referred and last seen by Dr. Laural Benes on 05/18/2020. At this visit trulicty 0.75 mg was added to current regimen and A1C was 11.9. Today, pt reports that he his tolerating Trulicity well with some mild bruising at injection site since last visit. Endorses some mild dry mouth. Diabetes longstanding.  Family History: T2DM (mother) Alcohol: 6 drinks a week Tobacco: current smoker, 2 cigars a month  Insurance coverage/medication affordability: none  Medication adherence reported.   Current diabetes medications include: metformin 1000 mg BID, lantus 20 units daily, trulicity 0.75 mg weekly Current hypertension medications include: lisinopril 10 mg daily, amlodipine 10 mg daily Current hyperlipidemia medications include: atorvastatin 60 mg daily  Patient denies hypoglycemic events.  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  Patient denies neuropathy (nerve pain). Patient denies visual changes. Patient reports self foot exams.    O:  Lab Results  Component Value Date   HGBA1C 11.9 (A) 05/18/2020   There were no vitals filed for this visit.  Lipid Panel     Component Value Date/Time   CHOL 175 05/18/2020 1151   TRIG 125 05/18/2020 1151   HDL 39 (L) 05/18/2020 1151   CHOLHDL 4.5 05/18/2020 1151   CHOLHDL 9.7 03/12/2016 0557   VLDL UNABLE TO CALCULATE IF TRIGLYCERIDE OVER 400 mg/dL 64/40/3474 2595   LDLCALC 113 (H) 05/18/2020 1151   Home fasting blood sugars: none  2 hour post-meal/random blood sugars: none. POCG: 267  Clinical Atherosclerotic Cardiovascular Disease  (ASCVD): No  The 10-year ASCVD risk score Denman George DC Jr., et al., 2013) is: 18.9%   Values used to calculate the score:     Age: 51 years     Sex: Male     Is Non-Hispanic African American: Yes     Diabetic: Yes     Tobacco smoker: Yes     Systolic Blood Pressure: 138 mmHg     Is BP treated: Yes     HDL Cholesterol: 39 mg/dL     Total Cholesterol: 175 mg/dL   A/P: Diabetes longstanding currently uncontrolled on current medications. Patient is able to verbalize appropriate hypoglycemia management plan. Medication adherence reported but is not recording blood sugars at home. Dry mouth reported could be due to high sugars but not concerning. Further medication optimization needed to improve diabetes control and encouraged to record sugars at home at least sometimes before the next visit. Will increase Trulicty and f/u in 1 month to see how patient is tolerating. -Continue metformin 1000 mg BID -Continue lantus 20 units daily -Increase Trulicity to 1.5 mg weekly -Extensively discussed pathophysiology of diabetes, recommended lifestyle interventions, dietary effects on blood sugar control -Counseled on s/sx of and management of hypoglycemia -Next A1C anticipated 07/2020.   ASCVD risk - primary prevention in patient with diabetes. Last LDL is not controlled. ASCVD risk score is not >20%  - high intensity statin okay to continue. -Continue atorvastatin 60 mg daily  Hypertension longstanding currently uncontrolled.  Blood pressure goal = <130/80 mmHg. Medication adherence reported but missed doses due to needing a refill.  Blood pressure control is suboptimal due to medications not being  optimized and lifestyle. -Continue lisinopril 10 mg daily -Continue amlodipine 10 mg daily  Written patient instructions provided.  Total time in face to face counseling 20 minutes. Follow up with Franky Macho in 1 month.   Patient seen with:  Paulino Door, PharmD Candidate [employer] 20[employer]  Butch Penny, PharmD, [place], CPP Clinical Pharmacist [facility] [phone]

## 2020-06-16 ENCOUNTER — Other Ambulatory Visit: Payer: Self-pay | Admitting: Internal Medicine

## 2020-06-16 DIAGNOSIS — E1169 Type 2 diabetes mellitus with other specified complication: Secondary | ICD-10-CM

## 2020-06-16 DIAGNOSIS — E669 Obesity, unspecified: Secondary | ICD-10-CM

## 2020-06-27 ENCOUNTER — Other Ambulatory Visit: Payer: Self-pay

## 2020-07-13 ENCOUNTER — Ambulatory Visit: Payer: Self-pay | Attending: Internal Medicine | Admitting: Pharmacist

## 2020-07-13 ENCOUNTER — Other Ambulatory Visit: Payer: Self-pay

## 2020-07-13 ENCOUNTER — Encounter: Payer: Self-pay | Admitting: Pharmacist

## 2020-07-13 DIAGNOSIS — E669 Obesity, unspecified: Secondary | ICD-10-CM

## 2020-07-13 DIAGNOSIS — E1169 Type 2 diabetes mellitus with other specified complication: Secondary | ICD-10-CM

## 2020-07-13 LAB — GLUCOSE, POCT (MANUAL RESULT ENTRY): POC Glucose: 138 mg/dl — AB (ref 70–99)

## 2020-07-13 NOTE — Progress Notes (Signed)
S:    PCP: Dr. Laural Benes  Patient arrives in good spirits.  Presents for diabetes evaluation, education, and management. Patient was referred and last seen by Dr. Laural Benes on 05/18/2020. Was last seen by CPP on 06/15/2020, at this visit trulicity was increased to 1.5 mg weekly.  Today, pt reports that he his tolerating new Trulicity well and the bruising from the last visit has resolved. Medication adherence fair (missed 2-3 doses of metformin in the past 2 weeks). Reports he is feeling better overall and feels good today. Endorses he does does not check sugar at home.  Family History: T2DM (mother) Alcohol: 6 drinks a week Tobacco: current smoker, 2 cigars a month  Insurance coverage/medication affordability: none  Medication adherence reported.   Current diabetes medications include: metformin 1000 mg BID, lantus 20 units daily, trulicity 1.5 mg weekly Current hypertension medications include: lisinopril 10 mg daily, amlodipine 10 mg daily Current hyperlipidemia medications include: atorvastatin 60 mg daily (takes 1.5 tablets of the 40mg )  Patient denies hypoglycemic events.  Patient reported dietary habits: -Tries to eat healthy but "sometimes it just doesn't workout that way" -Like to eat salads with chicken and french dressing  Patient-reported exercise habits: -Does a lot of walking at his job ) -Works 8-11 hours a day -Reports doing more walking at work   Patient denies nocturia (nighttime urination).  Patient denies neuropathy (nerve pain). Patient denies visual changes. Patient reports self foot exams.    O:  Lab Results  Component Value Date   HGBA1C 11.9 (A) 05/18/2020   There were no vitals filed for this visit.  Lipid Panel     Component Value Date/Time   CHOL 175 05/18/2020 1151   TRIG 125 05/18/2020 1151   HDL 39 (L) 05/18/2020 1151   CHOLHDL 4.5 05/18/2020 1151   CHOLHDL 9.7 03/12/2016 0557   VLDL UNABLE TO CALCULATE IF TRIGLYCERIDE OVER  400 mg/dL 03/14/2016 14/43/1540   LDLCALC 113 (H) 05/18/2020 1151   Home fasting blood sugars: none  2 hour post-meal/random blood sugars: none. POCG: 138  Clinical Atherosclerotic Cardiovascular Disease (ASCVD): No  The 10-year ASCVD risk score 05/20/2020 DC Jr., et al., 2013) is: 18.9%   Values used to calculate the score:     Age: 40 years     Sex: Male     Is Non-Hispanic African American: Yes     Diabetic: Yes     Tobacco smoker: Yes     Systolic Blood Pressure: 138 mmHg     Is BP treated: Yes     HDL Cholesterol: 39 mg/dL     Total Cholesterol: 175 mg/dL   A/P: Diabetes longstanding currently uncontrolled on current medications. Patient is able to verbalize appropriate hypoglycemia management plan. Medication adherence reported to be fair but is not recording blood sugars at home. Pt's glucose readings in clinic have improved drastically in the past month and fasting BG in clinic today is near goal. No therapy changes today since pt is tolerating medication and fasting BG is near goal. Will recheck A1C in May and optimize therapy from there. -Continue metformin 1000 mg BID -Continue lantus 20 units daily -Continue Trulicity 1.5 mg weekly -Extensively discussed pathophysiology of diabetes, recommended lifestyle interventions, dietary effects on blood sugar control -Counseled on s/sx of and management of hypoglycemia -Next A1C anticipated 07/2020.   ASCVD risk - primary prevention in patient with diabetes. Last LDL is not controlled. ASCVD risk score is not >20%  - high intensity statin okay to  continue. -Continue atorvastatin 60 mg daily  Hypertension longstanding currently uncontrolled.  Blood pressure goal = <130/80 mmHg. Medication adherence reported but missed doses due to needing a refill.  Blood pressure control is suboptimal due to medications not being optimized and lifestyle. -Continue lisinopril 10 mg daily -Continue amlodipine 10 mg daily  Written patient instructions  provided.  Total time in face to face counseling 20 minutes. Follow up with Franky Macho in 1 month.   Patient seen with:  Paulino Door, PharmD Candidate [employer]  Butch Penny, PharmD, [place], CPP Clinical Pharmacist [facility] [phone]

## 2020-08-09 ENCOUNTER — Other Ambulatory Visit: Payer: Self-pay | Admitting: Internal Medicine

## 2020-08-09 DIAGNOSIS — E1169 Type 2 diabetes mellitus with other specified complication: Secondary | ICD-10-CM

## 2020-08-09 DIAGNOSIS — E669 Obesity, unspecified: Secondary | ICD-10-CM

## 2020-08-11 ENCOUNTER — Other Ambulatory Visit: Payer: Self-pay

## 2020-08-11 MED FILL — Dulaglutide Soln Auto-injector 1.5 MG/0.5ML: SUBCUTANEOUS | 28 days supply | Qty: 2 | Fill #0 | Status: CN

## 2020-08-12 ENCOUNTER — Ambulatory Visit: Payer: Self-pay | Admitting: Pharmacist

## 2020-08-26 NOTE — Progress Notes (Addendum)
    S:    PCP: Dr. Laural Benes   No chief complaint on file.  Patient arrives on 08/27/20.  Presents for diabetes evaluation, education, and management Patient was referred and last seen by Primary Care Provider on 05/18/20. Patient was last seen by CPP on 4/18. At that visit, no changes were made to medications.   Patient reports diabetes is longstanding.   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   Current diabetes medications include: metformin 1000mg  BIDWM, Lantus 20 units daily (pt ran out d/t affordability ~2 weeks ago), Trulicity 1.5mg  weekly (ran out ~1 month ago d/t affordability) Current hypertension medications include: Lisinopril 10mg , Amlodipine 10mg  daily Current hyperlipidemia medications include: Atorvastatin 60mg  (takes 1.5 tablets of 40mg )   Patient denies hypoglycemic events.  Patient reported dietary habits: - has not changed since last visit - tries to eat healthy but "sometimes it just doesn't work out that way" - likes to eat salads with chicken and french dressing  Patient-reported exercise habits:  - has new job and walks 3.5-4 miles a night (works 2nd shift)   Patient denies nocturia (nighttime urination).  Patient denies neuropathy (nerve pain). Patient denies visual changes. Patient denies self foot exams.     O: Lab Results  Component Value Date   HGBA1C 8.7 (A) 08/27/2020   There were no vitals filed for this visit.  Lipid Panel     Component Value Date/Time   CHOL 175 05/18/2020 1151   TRIG 125 05/18/2020 1151   HDL 39 (L) 05/18/2020 1151   CHOLHDL 4.5 05/18/2020 1151   CHOLHDL 9.7 03/12/2016 0557   VLDL UNABLE TO CALCULATE IF TRIGLYCERIDE OVER 400 mg/dL 05/20/2020 05/20/2020   LDLCALC 113 (H) 05/18/2020 1151   No POCT glucose taken Home fasting blood sugars: none  2 hour  post-meal/random blood sugars: none  Clinical Atherosclerotic Cardiovascular Disease (ASCVD): No The 10-year ASCVD risk score 05/20/2020 DC Jr., et al., 2013) is: 18.9%   Values used to calculate the score:     Age: 52 years     Sex: Male     Is Non-Hispanic African American: Yes     Diabetic: Yes     Tobacco smoker: Yes     Systolic Blood Pressure: 138 mmHg     Is BP treated: Yes     HDL Cholesterol: 39 mg/dL     Total Cholesterol: 175 mg/dL   A/P: Diabetes longstanding currently uncontrolled, however, A1c today is down to 8.7 from 11.9%. Commended pt on his improvement. Patient is able to verbalize appropriate hypoglycemia management plan. Medication adherence appears poor. Control is suboptimal due to inability to pay for medications. Informed pt that medications can be obtained from our pharmacy and pt was amenable. He will pick up after appointment with [id number] today. No medication changes made. - Continue Metformin 1000mg  BIDWM - Continue Lantus 20u daily - Continue Trulicity 1.5 mg weekly - Extensively discussed pathophysiology of diabetes, recommended lifestyle interventions, dietary effects on blood sugar control - Counseled on s/sx of and management of hypoglycemia - Next A1C anticipated 11/2020.   Written patient instructions provided.  Total time in face to face counseling 25 minutes.   Follow up with PCP in person on 6/21.     Patient seen with: 2014, PharmD/MBA Candidate [employer] Class [employer] 2023  [place], PharmD, [place], CPP Clinical Pharmacist [facility] [phone]

## 2020-08-27 ENCOUNTER — Other Ambulatory Visit: Payer: Self-pay

## 2020-08-27 ENCOUNTER — Ambulatory Visit: Payer: No Typology Code available for payment source | Attending: Internal Medicine | Admitting: Pharmacist

## 2020-08-27 DIAGNOSIS — E1169 Type 2 diabetes mellitus with other specified complication: Secondary | ICD-10-CM | POA: Diagnosis not present

## 2020-08-27 DIAGNOSIS — E669 Obesity, unspecified: Secondary | ICD-10-CM | POA: Diagnosis not present

## 2020-08-27 DIAGNOSIS — I10 Essential (primary) hypertension: Secondary | ICD-10-CM

## 2020-08-27 LAB — POCT GLYCOSYLATED HEMOGLOBIN (HGB A1C): Hemoglobin A1C: 8.7 % — AB (ref 4.0–5.6)

## 2020-08-27 MED ORDER — BASAGLAR KWIKPEN 100 UNIT/ML ~~LOC~~ SOPN
20.0000 [IU] | PEN_INJECTOR | Freq: Every day | SUBCUTANEOUS | 11 refills | Status: DC
  Filled 2020-08-27 – 2020-09-04 (×2): qty 6, 30d supply, fill #0
  Filled 2020-10-05: qty 6, 30d supply, fill #1
  Filled 2020-11-06: qty 6, 30d supply, fill #2
  Filled 2020-12-10: qty 6, 30d supply, fill #3
  Filled 2021-01-08: qty 6, 30d supply, fill #4
  Filled 2021-02-04: qty 6, 30d supply, fill #5
  Filled 2021-03-18: qty 6, 30d supply, fill #6
  Filled 2021-04-30: qty 6, 30d supply, fill #0

## 2020-08-27 MED ORDER — ATORVASTATIN CALCIUM 40 MG PO TABS
ORAL_TABLET | ORAL | 1 refills | Status: DC
  Filled 2020-08-27 – 2020-12-10 (×2): qty 45, 30d supply, fill #0

## 2020-08-27 MED ORDER — LISINOPRIL 10 MG PO TABS
10.0000 mg | ORAL_TABLET | Freq: Every day | ORAL | 2 refills | Status: DC
  Filled 2020-08-27 – 2020-10-05 (×2): qty 30, 30d supply, fill #0
  Filled 2020-11-06: qty 30, 30d supply, fill #1
  Filled 2020-12-10: qty 30, 30d supply, fill #2

## 2020-08-27 MED ORDER — METFORMIN HCL 1000 MG PO TABS
1.0000 | ORAL_TABLET | Freq: Two times a day (BID) | ORAL | 2 refills | Status: DC
  Filled 2020-08-27: qty 60, 30d supply, fill #0

## 2020-08-27 MED ORDER — AMLODIPINE BESYLATE 10 MG PO TABS
ORAL_TABLET | Freq: Every day | ORAL | 2 refills | Status: DC
  Filled 2020-08-27: qty 30, fill #0
  Filled 2020-09-15: qty 30, 30d supply, fill #0
  Filled 2020-11-06: qty 30, 30d supply, fill #1
  Filled 2020-12-10: qty 30, 30d supply, fill #2

## 2020-08-27 MED FILL — Dulaglutide Soln Auto-injector 1.5 MG/0.5ML: SUBCUTANEOUS | 28 days supply | Qty: 2 | Fill #0 | Status: CN

## 2020-09-03 ENCOUNTER — Other Ambulatory Visit: Payer: Self-pay

## 2020-09-04 ENCOUNTER — Other Ambulatory Visit: Payer: Self-pay

## 2020-09-04 MED FILL — Dulaglutide Soln Auto-injector 1.5 MG/0.5ML: SUBCUTANEOUS | 28 days supply | Qty: 2 | Fill #0 | Status: AC

## 2020-09-07 ENCOUNTER — Other Ambulatory Visit: Payer: Self-pay

## 2020-09-07 ENCOUNTER — Emergency Department (HOSPITAL_COMMUNITY)
Admission: EM | Admit: 2020-09-07 | Discharge: 2020-09-08 | Disposition: A | Discharge status: Home / Self Care | Payer: No Typology Code available for payment source | Attending: Emergency Medicine | Admitting: Emergency Medicine

## 2020-09-07 DIAGNOSIS — F17290 Nicotine dependence, other tobacco product, uncomplicated: Secondary | ICD-10-CM | POA: Insufficient documentation

## 2020-09-07 DIAGNOSIS — R112 Nausea with vomiting, unspecified: Secondary | ICD-10-CM | POA: Diagnosis not present

## 2020-09-07 DIAGNOSIS — Z7982 Long term (current) use of aspirin: Secondary | ICD-10-CM | POA: Diagnosis not present

## 2020-09-07 DIAGNOSIS — Z794 Long term (current) use of insulin: Secondary | ICD-10-CM | POA: Diagnosis not present

## 2020-09-07 DIAGNOSIS — Z79899 Other long term (current) drug therapy: Secondary | ICD-10-CM | POA: Insufficient documentation

## 2020-09-07 DIAGNOSIS — I10 Essential (primary) hypertension: Secondary | ICD-10-CM | POA: Insufficient documentation

## 2020-09-07 DIAGNOSIS — E119 Type 2 diabetes mellitus without complications: Secondary | ICD-10-CM | POA: Insufficient documentation

## 2020-09-07 DIAGNOSIS — R519 Headache, unspecified: Secondary | ICD-10-CM | POA: Diagnosis present

## 2020-09-07 DIAGNOSIS — Z7984 Long term (current) use of oral hypoglycemic drugs: Secondary | ICD-10-CM | POA: Diagnosis not present

## 2020-09-07 DIAGNOSIS — G4489 Other headache syndrome: Secondary | ICD-10-CM | POA: Diagnosis not present

## 2020-09-07 NOTE — ED Triage Notes (Signed)
Pt reports headache unrelieved by [facility] and goody powders since Thursday. Denies hitting head or falling recently. Hypertensive in triage, not medicated.

## 2020-09-08 MED ORDER — PROCHLORPERAZINE EDISYLATE 10 MG/2ML IJ SOLN
10.0000 mg | Freq: Once | INTRAMUSCULAR | Status: AC
  Administered 2020-09-08: 10 mg via INTRAVENOUS
  Filled 2020-09-08: qty 2

## 2020-09-08 MED ORDER — DIPHENHYDRAMINE HCL 50 MG/ML IJ SOLN
25.0000 mg | Freq: Once | INTRAMUSCULAR | Status: AC
  Administered 2020-09-08: 25 mg via INTRAVENOUS
  Filled 2020-09-08: qty 1

## 2020-09-08 NOTE — Discharge Instructions (Addendum)

## 2020-09-08 NOTE — ED Provider Notes (Signed)
[facility] EMERGENCY DEPARTMENT Provider Note   CSN: [id number] Arrival date & time: 09/07/20  1034     History Chief Complaint  Patient presents with   Headache    William Martin is a 40 y.o. male.  The history is provided by the patient.  Headache Pain location:  Generalized Severity currently:  6/10 Onset quality:  Gradual Duration:  5 days Timing:  Constant Progression:  Worsening Chronicity:  New Similar to prior headaches: yes   Relieved by:  Nothing Worsened by:  Nothing Associated symptoms: nausea and vomiting   Associated symptoms: no abdominal pain, no back pain, no blurred vision, no fever, no focal weakness, no neck stiffness, no visual change and no weakness   Patient with history of obesity, diabetes, hypertension presents with headache.  He reports approximately 5 days ago his glucose dropped into the 40s-50s.  He reports he was able to correct this, but began having headache soon after.  It started on the left side of his head and radiates to his whole head at this time.  It was not sudden onset.  He does report an episode of vomiting.  No fevers.  No focal weakness.  There are no visual changes No recent head trauma.  No previous history of stroke.  He reports medication compliance    Past Medical History:  Diagnosis Date   Diabetes mellitus without complication (HCC)    GERD (gastroesophageal reflux disease)    Tobacco abuse     Patient Active Problem List   Diagnosis Date Noted   Hyperlipidemia associated with type 2 diabetes mellitus (HCC) 03/04/2019   Tobacco dependence 07/31/2018   Immunization due 01/27/2017   Essential hypertension 01/27/2017   Former cigar smoker 01/27/2017   Obesity 03/14/2016   Hyperlipidemia 03/14/2016   Hyperlipidemia due to type 2 diabetes mellitus (HCC) 03/14/2016   New onset type 2 diabetes mellitus (HCC) 03/11/2016   GERD (gastroesophageal reflux disease) 03/11/2016    No past surgical history on  file.     Family History  Problem Relation Age of Onset   Diabetes type II Mother     Social History   Tobacco Use   Smoking status: Some Days    Pack years: 0.00    Types: Cigars   Smokeless tobacco: Never   Tobacco comments:    2 cigars/month  Vaping Use   Vaping Use: Never used  Substance Use Topics   Alcohol use: Yes    Alcohol/week: 6.0 standard drinks    Types: 6 Standard drinks or equivalent per week    Comment: on weekends   Drug use: No    Home Medications Prior to Admission medications   Medication Sig Start Date End Date Taking? Authorizing Provider  amLODipine (NORVASC) 10 MG tablet TAKE 1 TABLET (10 MG TOTAL) BY MOUTH DAILY. Patient taking differently: Take 10 mg by mouth daily. 08/27/20 08/27/21 Yes Marcine Matar, MD  Aspirin-Salicylamide-Caffeine (BC HEADACHE POWDER PO) Take 1 packet by mouth daily as needed (headache).   Yes [provider]  atorvastatin (LIPITOR) 40 MG tablet TAKE 1.5 TABLETS (60 MG TOTAL) BY MOUTH DAILY. Patient taking differently: Take 60 mg by mouth daily. 08/27/20 08/27/21 Yes Marcine Matar, MD  B-D INS SYR ULTRAFINE 1CC/30G 30G X 1/2" 1 ML MISC USE AS DIRECTED TWICE A DAY 03/27/20  Yes Marcine Matar, MD  Dulaglutide 1.5 MG/0.5ML SOPN INJECT 1.5 MG INTO THE SKIN ONCE A WEEK. Patient taking differently: Inject 1.5 mg into  the skin every Friday. 06/15/20 06/15/21 Yes Marcine Matar, MD  Insulin Glargine [facility] KWIKPEN) 100 UNIT/ML Inject 20 Units into the skin daily. Patient taking differently: Inject 20 Units into the skin at bedtime. 08/27/20  Yes Marcine Matar, MD  Insulin Pen Needle (PEN NEEDLES) 31G X 8 MM MISC UAD 03/04/19  Yes Marcine Matar, MD  lisinopril (ZESTRIL) 10 MG tablet Take 1 tablet (10 mg total) by mouth daily. 08/27/20  Yes Marcine Matar, MD  metFORMIN (GLUCOPHAGE) 1000 MG tablet Take 1 tablet (1,000 mg total) by mouth 2 (two) times daily. 08/27/20  Yes Marcine Matar, MD  omeprazole  (PRILOSEC) 20 MG capsule TAKE 1 CAPSULE EVERY DAY AS NEEDED FOR HEARTBURN Patient taking differently: Take 20 mg by mouth daily as needed (heartburn). 04/17/20  Yes Marcine Matar, MD    Allergies    Bee venom  Review of Systems   Review of Systems  Constitutional:  Negative for fever.  Eyes:  Negative for blurred vision and visual disturbance.  Cardiovascular:  Negative for chest pain.  Gastrointestinal:  Positive for nausea and vomiting. Negative for abdominal pain.  Musculoskeletal:  Negative for back pain and neck stiffness.  Neurological:  Positive for headaches. Negative for focal weakness and weakness.  All other systems reviewed and are negative.  Physical Exam Updated Vital Signs BP (!) 128/93   Pulse 91   Temp 98.3 F (36.8 C) (Oral)   Resp 19   SpO2 98%   Physical Exam CONSTITUTIONAL: Well developed/well nourished HEAD: Normocephalic/atraumatic EYES: EOMI/PERRL, no nystagmus ENMT: Mucous membranes moist NECK: supple no meningeal signs, no bruits SPINE/BACK:entire spine nontender CV: S1/S2 noted, no murmurs/rubs/gallops noted LUNGS: Lungs are clear to auscultation bilaterally, no apparent distress ABDOMEN: soft, nontender, no rebound or guarding GU:no cva tenderness NEURO:Awake/alert, face symmetric, no arm or leg drift is noted Equal 5/5 strength with shoulder abduction, elbow flex/extension, wrist flex/extension in upper extremities and equal hand grips bilaterally Equal 5/5 strength with hip flexion,knee flex/extension, foot dorsi/plantar flexion Cranial nerves 3/4/5/6/10/03/08/11/12 tested and intact Gait normal without ataxia No past pointing Sensation to light touch intact in all extremities EXTREMITIES: pulses normal, full ROM SKIN: warm, color normal PSYCH: no abnormalities of mood noted, alert and oriented to situation  ED Results / Procedures / Treatments   Labs (all labs ordered are listed, but only abnormal results are displayed) Labs Reviewed  - No data to display  EKG None  Radiology No results found.  Procedures Procedures   Medications Ordered in ED Medications  prochlorperazine (COMPAZINE) injection 10 mg (10 mg Intravenous Given 09/08/20 0448)  diphenhydrAMINE (BENADRYL) injection 25 mg (25 mg Intravenous Given 09/08/20 0452)    ED Course  I have reviewed the triage vital signs and the nursing notes.    MDM Rules/Calculators/A&P                          Patient presents for headache for the past several days.  On exam he is in no acute distress, no focal neurodeficits.  He improved with medications, and he feels appropriate for discharge home.  I have low suspicion for acute neurologic emergency at this time.  Patient reports this began soon after he had a drop in his glucose. Final Clinical Impression(s) / ED Diagnoses Final diagnoses:  Other headache syndrome    Rx / DC Orders ED Discharge Orders     None  Zadie Rhine, MD 09/08/20 [phone]

## 2020-09-11 ENCOUNTER — Telehealth: Payer: Self-pay | Admitting: Nurse Practitioner

## 2020-09-11 NOTE — Telephone Encounter (Signed)
VM was left FU appointment on most recent ED Visit 09/08/20

## 2020-09-15 ENCOUNTER — Other Ambulatory Visit: Payer: Self-pay

## 2020-09-15 ENCOUNTER — Encounter: Payer: Self-pay | Admitting: Internal Medicine

## 2020-09-15 ENCOUNTER — Ambulatory Visit: Payer: No Typology Code available for payment source | Attending: Internal Medicine | Admitting: Internal Medicine

## 2020-09-15 VITALS — BP 144/97 | HR 97 | Resp 16 | Wt 277.8 lb

## 2020-09-15 DIAGNOSIS — Z23 Encounter for immunization: Secondary | ICD-10-CM

## 2020-09-15 DIAGNOSIS — E1169 Type 2 diabetes mellitus with other specified complication: Secondary | ICD-10-CM

## 2020-09-15 DIAGNOSIS — E669 Obesity, unspecified: Secondary | ICD-10-CM | POA: Diagnosis not present

## 2020-09-15 DIAGNOSIS — E1159 Type 2 diabetes mellitus with other circulatory complications: Secondary | ICD-10-CM

## 2020-09-15 DIAGNOSIS — I152 Hypertension secondary to endocrine disorders: Secondary | ICD-10-CM | POA: Diagnosis not present

## 2020-09-15 DIAGNOSIS — E785 Hyperlipidemia, unspecified: Secondary | ICD-10-CM

## 2020-09-15 LAB — GLUCOSE, POCT (MANUAL RESULT ENTRY): POC Glucose: 198 mg/dl — AB (ref 70–99)

## 2020-09-15 NOTE — Patient Instructions (Signed)
I encourage you to check your blood sugars if only once a day before breakfast.  If you find that your morning blood sugars are consistently higher than 130, then increase the Lantus insulin by 2 units every 3 days until you achieve morning blood sugars less than 130 consistently  I encourage you to get your eye exam done before the end of the year.

## 2020-09-15 NOTE — Progress Notes (Signed)
Patient ID: William Martin, male    DOB: 03/03/1981  MRN: [id number]  CC: Diabetes and Hypertension   Subjective: William Martin is a 40 y.o. male who presents for chronic ds management His concerns today include:  Patient with history of DM, HTN, HL, obesity, tobacco dependence (cigars).    DM: Results for orders placed or performed in visit on 09/15/20  POCT glucose (manual entry)  Result Value Ref Range   POC Glucose 198 (A) 70 - 99 mg/dl   Lab Results  Component Value Date   HGBA1C 8.7 (A) 08/27/2020  -saw clinical pharmacist a few times since last visit.  His A1c improved from 11.9 down to 8.7 -reports compliance with meds that include metformin 1 g twice a day, Lantus 20 units daily and Trulicity. He does not check his blood sugars. Feels she can do better with eating habits but fails to elaborate.  Drinks water and watered-down sweet tea. Never seen a nutritionist and does not want to right now as he is unemployed Not exercising as he should Over due for eye exam.  No blurred vision.  Declines referral for eye exam at this time. No numbness in feet.  HYPERTENSION Currently taking: see medication list.  On Lisinopril and Norvasc Med Adherence: [x]  Yes - did not take as yet for the morning Medication side effects: []  Yes    [x]  No Adherence with salt restriction: [x]  Yes    []  No Home Monitoring?: []  Yes    [x]  No Monitoring Frequency: []  Yes    []  No Home BP results range: []  Yes    []  No SOB? []  Yes    [x]  No Chest Pain?: []  Yes    [x]  No Leg swelling?: []  Yes    [x]  No Headaches?: [x]  Yes  -seen in ER last wk for HA.  Thinks it was migraine.  No previous episodes Dizziness? []  Yes    [x]  No Comments:   HL: Lipitor increased to 60 mg daily.  Out x 1 mth.  Last LDL was 113.  HM: Due for COVID booster but he declines at this time.  Due for pneumonia vaccine. Patient Active Problem List   Diagnosis Date Noted   Hyperlipidemia associated with type 2 diabetes  mellitus (HCC) 03/04/2019   Tobacco dependence 07/31/2018   Immunization due 01/27/2017   Essential hypertension 01/27/2017   Former cigar smoker 01/27/2017   Obesity 03/14/2016   Hyperlipidemia 03/14/2016   Hyperlipidemia due to type 2 diabetes mellitus (HCC) 03/14/2016   New onset type 2 diabetes mellitus (HCC) 03/11/2016   GERD (gastroesophageal reflux disease) 03/11/2016     Current Outpatient Medications on File Prior to Visit  Medication Sig Dispense Refill   amLODipine (NORVASC) 10 MG tablet TAKE 1 TABLET (10 MG TOTAL) BY MOUTH DAILY. (Patient taking differently: Take 10 mg by mouth daily.) 30 tablet 2   Aspirin-Salicylamide-Caffeine (BC HEADACHE POWDER PO) Take 1 packet by mouth daily as needed (headache).     atorvastatin (LIPITOR) 40 MG tablet TAKE 1.5 TABLETS (60 MG TOTAL) BY MOUTH DAILY. (Patient taking differently: Take 60 mg by mouth daily.) 45 tablet 1   B-D INS SYR ULTRAFINE 1CC/30G 30G X 1/2" 1 ML MISC USE AS DIRECTED TWICE A DAY 100 each 3   Dulaglutide 1.5 MG/0.5ML SOPN INJECT 1.5 MG INTO THE SKIN ONCE A WEEK. (Patient taking differently: Inject 1.5 mg into the skin every Friday.) 2 mL 2   Insulin Glargine (BASAGLAR KWIKPEN) 100 UNIT/ML Inject  20 Units into the skin daily. (Patient taking differently: Inject 20 Units into the skin at bedtime.) 15 mL 11   Insulin Pen Needle (PEN NEEDLES) 31G X 8 MM MISC UAD 100 each 6   lisinopril (ZESTRIL) 10 MG tablet Take 1 tablet (10 mg total) by mouth daily. 30 tablet 2   metFORMIN (GLUCOPHAGE) 1000 MG tablet Take 1 tablet (1,000 mg total) by mouth 2 (two) times daily. 60 tablet 2   omeprazole (PRILOSEC) 20 MG capsule TAKE 1 CAPSULE EVERY DAY AS NEEDED FOR HEARTBURN (Patient taking differently: Take 20 mg by mouth daily as needed (heartburn).) 30 capsule 1   No current facility-administered medications on file prior to visit.    Allergies  Allergen Reactions   Bee Venom Swelling    Social History   Socioeconomic History    Marital status: Single    Spouse name: Not on file   Number of children: Not on file   Years of education: Not on file   Highest education level: Not on file  Occupational History   Not on file  Tobacco Use   Smoking status: Some Days    Pack years: 0.00    Types: Cigars   Smokeless tobacco: Never   Tobacco comments:    2 cigars/month  Vaping Use   Vaping Use: Never used  Substance and Sexual Activity   Alcohol use: Yes    Alcohol/week: 6.0 standard drinks    Types: 6 Standard drinks or equivalent per week    Comment: on weekends   Drug use: No   Sexual activity: Not on file  Other Topics Concern   Not on file  Social History Narrative   Not on file   Social Determinants of Health   Financial Resource Strain: Not on file  Food Insecurity: Not on file  Transportation Needs: Not on file  Physical Activity: Not on file  Stress: Not on file  Social Connections: Not on file  Intimate Partner Violence: Not on file    Family History  Problem Relation Age of Onset   Diabetes type II Mother     No past surgical history on file.  ROS: Review of Systems Negative except as stated above  PHYSICAL EXAM: BP (!) 144/97   Pulse 97   Resp 16   Wt 277 lb 12.8 oz (126 kg)   SpO2 98%   BMI 39.86 kg/m   Wt Readings from Last 3 Encounters:  09/15/20 277 lb 12.8 oz (126 kg)  05/18/20 276 lb 6.4 oz (125.4 kg)  10/18/19 (!) 292 lb 9.6 oz (132.7 kg)   Physical Exam General appearance - alert, well appearing, middle age male and in no distress Mental status - normal mood, behavior, speech, dress, motor activity, and thought processes Neck - supple, no significant adenopathy Chest - clear to auscultation, no wheezes, rales or rhonchi, symmetric air entry Heart - normal rate, regular rhythm, normal S1, S2, no murmurs, rubs, clicks or gallops Extremities - peripheral pulses normal, no pedal edema, no clubbing or cyanosis Diabetic Foot Exam - Simple   Simple Foot Form Visual  Inspection No deformities, no ulcerations, no other skin breakdown bilaterally: Yes Sensation Testing Intact to touch and monofilament testing bilaterally: Yes Pulse Check Posterior Tibialis and Dorsalis pulse intact bilaterally: Yes Comments     CMP Latest Ref Rng & Units 03/02/2020 02/05/2019 01/31/2019  Glucose 65 - 99 mg/dL 671(I) 458(KD) 983(J)  BUN 6 - 20 mg/dL 16 16 15   Creatinine 0.76 -  1.27 mg/dL 2.72 5.36 6.44  Sodium 134 - 144 mmol/L 135 131(L) 130(L)  Potassium 3.5 - 5.2 mmol/L 4.2 4.9 4.2  Chloride 96 - 106 mmol/L 95(L) 96(L) 95(L)  CO2 20 - 29 mmol/L 24 21(L) 21(L)  Calcium 8.7 - 10.2 mg/dL 9.6 9.3 8.9  Total Protein 6.0 - 8.5 g/dL 7.9 - -  Total Bilirubin 0.0 - 1.2 mg/dL 0.9 - -  Alkaline Phos 44 - 121 IU/L 115 - -  AST 0 - 40 IU/L 23 - -  ALT 0 - 44 IU/L 48(H) - -   Lipid Panel     Component Value Date/Time   CHOL 175 05/18/2020 1151   TRIG 125 05/18/2020 1151   HDL 39 (L) 05/18/2020 1151   CHOLHDL 4.5 05/18/2020 1151   CHOLHDL 9.7 03/12/2016 0557   VLDL UNABLE TO CALCULATE IF TRIGLYCERIDE OVER 400 mg/dL 03/47/4259 5638   LDLCALC 113 (H) 05/18/2020 1151    CBC    Component Value Date/Time   WBC 9.6 03/02/2020 1011   WBC 7.8 02/05/2019 1227   RBC 5.55 03/02/2020 1011   RBC 4.89 02/05/2019 1227   HGB 14.6 03/02/2020 1011   HCT 44.9 03/02/2020 1011   PLT 240 03/02/2020 1011   MCV 81 03/02/2020 1011   MCH 26.3 (L) 03/02/2020 1011   MCH 28.0 02/05/2019 1227   MCHC 32.5 03/02/2020 1011   MCHC 33.5 02/05/2019 1227   RDW 15.8 (H) 03/02/2020 1011    ASSESSMENT AND PLAN: 1. Diabetes mellitus type 2 in obese (HCC) Commended him on improvement of his A1c. Encouraged to check blood sugars if only once a day in the mornings before breakfast.  If after 3 days, his morning blood sugar readings are still greater than 130, he should increase Lantus insulin by 2 units.  Should increase Lantus insulin in 2 unit increments every 3 days until morning blood sugars are  consistently between 90-130. Discussed and encourage healthy eating habits.  Printed information also provided. Encouraged him to get in some form of moderate intensity exercise at least 5 days a week for 30 minutes. Discussed the importance of yearly eye exam.  Encouraged him to get one as soon as he is able to afford. - POCT glucose (manual entry)  2. Hypertension associated with diabetes (HCC) Not at goal but he has not taken medications as yet for the morning.  He will take them as soon as he returns home.  3. Hyperlipidemia associated with type 2 diabetes mellitus (HCC) Continue atorvastatin.  Refill sent.  4. Need for vaccination against Streptococcus pneumoniae Given Prevnar 20 today.    Patient was given the opportunity to ask questions.  Patient verbalized understanding of the plan and was able to repeat key elements of the plan.   Orders Placed This Encounter  Procedures   POCT glucose (manual entry)     Requested Prescriptions    No prescriptions requested or ordered in this encounter    No follow-ups on file.  Jonah Blue, MD, FACP

## 2020-10-05 ENCOUNTER — Other Ambulatory Visit: Payer: Self-pay

## 2020-10-05 MED FILL — Dulaglutide Soln Auto-injector 1.5 MG/0.5ML: SUBCUTANEOUS | 28 days supply | Qty: 2 | Fill #1 | Status: AC

## 2020-11-03 ENCOUNTER — Other Ambulatory Visit: Payer: Self-pay | Admitting: Internal Medicine

## 2020-11-03 DIAGNOSIS — E1169 Type 2 diabetes mellitus with other specified complication: Secondary | ICD-10-CM

## 2020-11-06 ENCOUNTER — Other Ambulatory Visit: Payer: Self-pay | Admitting: Internal Medicine

## 2020-11-06 ENCOUNTER — Other Ambulatory Visit: Payer: Self-pay

## 2020-11-06 MED ORDER — TRULICITY 1.5 MG/0.5ML ~~LOC~~ SOAJ
1.5000 mg | SUBCUTANEOUS | 2 refills | Status: DC
  Filled 2020-11-06 – 2020-12-10 (×2): qty 2, 28d supply, fill #0
  Filled 2021-01-08: qty 2, 28d supply, fill #1

## 2020-11-06 NOTE — Telephone Encounter (Signed)
  Notes to clinic:  This request has changes from the previous prescription Review for refill    Requested Prescriptions  Pending Prescriptions Disp Refills   Dulaglutide (TRULICITY) 1.5 MG/0.5ML SOPN 2 mL 2    Sig: INJECT 1.5 MG INTO THE SKIN ONCE A WEEK.     Endocrinology:  Diabetes - GLP-1 Receptor Agonists Failed - 11/06/2020  9:15 AM      Failed - HBA1C is between 0 and 7.9 and within 180 days    Hemoglobin A1C  Date Value Ref Range Status  08/27/2020 8.7 (A) 4.0 - 5.6 % Final   HbA1c, POC (controlled diabetic range)  Date Value Ref Range Status  05/18/2020 11.9 (A) 0.0 - 7.0 % Final          Passed - Valid encounter within last 6 months    Recent Outpatient Visits           1 month ago Diabetes mellitus type 2 in obese [facility])   [facility] Jonah Blue B, MD   2 months ago Diabetes mellitus type 2 in obese [facility])   [place] [facility] [place], Jeannett Senior L, RPH-CPP   3 months ago Diabetes mellitus type 2 in obese [facility])   [place] [facility] [place], Jeannett Senior L, RPH-CPP   4 months ago Diabetes mellitus type 2 in obese [facility])   [facility] Lo[place] Huxley, Cornelius Moras, RPH-CPP   5 months ago Encounter for medication review and counseling   [facility] Lois Huxley, Cornelius Moras, RPH-CPP       Future Appointments             In 2 months Laural Benes, Binnie Rail, MD [facility]

## 2020-11-09 ENCOUNTER — Other Ambulatory Visit: Payer: Self-pay

## 2020-11-16 ENCOUNTER — Other Ambulatory Visit: Payer: Self-pay

## 2020-12-10 ENCOUNTER — Other Ambulatory Visit: Payer: Self-pay

## 2021-01-08 ENCOUNTER — Other Ambulatory Visit: Payer: Self-pay | Admitting: Internal Medicine

## 2021-01-08 ENCOUNTER — Other Ambulatory Visit: Payer: Self-pay

## 2021-01-08 DIAGNOSIS — E1169 Type 2 diabetes mellitus with other specified complication: Secondary | ICD-10-CM

## 2021-01-08 MED ORDER — INSULIN PEN NEEDLE 31G X 8 MM MISC
6 refills | Status: DC
  Filled 2021-01-08: qty 100, 30d supply, fill #0

## 2021-01-08 NOTE — Telephone Encounter (Signed)
Requested Prescriptions  Pending Prescriptions Disp Refills  . Insulin Pen Needle (PEN NEEDLES) 31G X 8 MM MISC 100 each 6    Sig: UAD     Endocrinology: Diabetes - Testing Supplies Passed - 01/08/2021  8:41 AM      Passed - Valid encounter within last 12 months    Recent Outpatient Visits          3 months ago Diabetes mellitus type 2 in obese [facility])   [place] [facility] Jonah Blue B, MD   4 months ago Diabetes mellitus type 2 in obese [facility])   [facility] [place], Jeannett Senior L, RPH-CPP   5 months ago Diabetes mellitus type 2 in obese [facility])   [facility] [place], Jeannett Senior L, RPH-CPP   6 months ago Diabetes mellitus type 2 in obese [facility])   [facility] Lois Huxley, Cornelius Moras, RPH-CPP   7 months ago Encounter for medication review and counseling   [facility] Lois Huxley, Cornelius Moras, RPH-CPP      Future Appointments            In 1 week Marcine Matar, MD [facility]

## 2021-01-11 ENCOUNTER — Other Ambulatory Visit: Payer: Self-pay

## 2021-01-15 ENCOUNTER — Other Ambulatory Visit: Payer: Self-pay | Admitting: Internal Medicine

## 2021-01-15 ENCOUNTER — Other Ambulatory Visit: Payer: Self-pay

## 2021-01-15 DIAGNOSIS — I10 Essential (primary) hypertension: Secondary | ICD-10-CM

## 2021-01-15 NOTE — Telephone Encounter (Signed)
Requested medications are due for refill today yes  Requested medications are on the active medication list yes  Last refill 9/15  Last visit 09/15/20  Future visit scheduled 10/24  Notes to clinic failed protocol due to labs more than 11 days old, 03/02/20, please assess.   Requested Prescriptions  Pending Prescriptions Disp Refills   lisinopril (ZESTRIL) 10 MG tablet 30 tablet 2    Sig: Take 1 tablet (10 mg total) by mouth daily.     Cardiovascular:  ACE Inhibitors Failed - 01/15/2021  8:38 AM      Failed - Cr in normal range and within 180 days    Creat  Date Value Ref Range Status  03/17/2016 0.89 0.60 - 1.35 mg/dL Final   Creatinine, Ser  Date Value Ref Range Status  03/02/2020 0.96 0.76 - 1.27 mg/dL Final   Creatinine, Urine  Date Value Ref Range Status  03/11/2016 21.75 mg/dL Final    Comment:    Performed at [facility]          Failed - K in normal range and within 180 days    Potassium  Date Value Ref Range Status  03/02/2020 4.2 3.5 - 5.2 mmol/L Final          Failed - Last BP in normal range    BP Readings from Last 1 Encounters:  09/15/20 (!) 144/97          Passed - Patient is not pregnant      Passed - Valid encounter within last 6 months    Recent Outpatient Visits           4 months ago Diabetes mellitus type 2 in obese [facility])   [place] [facility] Jonah Blue B, MD   4 months ago Diabetes mellitus type 2 in obese [facility])   [facility] [place], Jeannett Senior L, RPH-CPP   6 months ago Diabetes mellitus type 2 in obese [facility])   [place] [place], Jeannett Senior L, RPH-CPP   7 months ago Diabetes mellitus type 2 in obese [facility])   [facility] Lois Huxley, Cornelius Moras, RPH-CPP   8 months ago Encounter for medication review and counseling   [facility] Lois Huxley, Cornelius Moras, RPH-CPP       Future  Appointments             In 3 days Marcine Matar, MD [facility]

## 2021-01-18 ENCOUNTER — Other Ambulatory Visit: Payer: Self-pay

## 2021-01-18 ENCOUNTER — Encounter: Payer: Self-pay | Admitting: Internal Medicine

## 2021-01-18 ENCOUNTER — Ambulatory Visit: Payer: No Typology Code available for payment source | Attending: Internal Medicine | Admitting: Internal Medicine

## 2021-01-18 VITALS — BP 136/90 | HR 69 | Resp 16 | Wt 275.2 lb

## 2021-01-18 DIAGNOSIS — M549 Dorsalgia, unspecified: Secondary | ICD-10-CM | POA: Diagnosis not present

## 2021-01-18 DIAGNOSIS — F17290 Nicotine dependence, other tobacco product, uncomplicated: Secondary | ICD-10-CM | POA: Diagnosis not present

## 2021-01-18 DIAGNOSIS — Z23 Encounter for immunization: Secondary | ICD-10-CM

## 2021-01-18 DIAGNOSIS — Z6839 Body mass index (BMI) 39.0-39.9, adult: Secondary | ICD-10-CM

## 2021-01-18 DIAGNOSIS — E1159 Type 2 diabetes mellitus with other circulatory complications: Secondary | ICD-10-CM

## 2021-01-18 DIAGNOSIS — E669 Obesity, unspecified: Secondary | ICD-10-CM

## 2021-01-18 DIAGNOSIS — E1169 Type 2 diabetes mellitus with other specified complication: Secondary | ICD-10-CM

## 2021-01-18 DIAGNOSIS — I152 Hypertension secondary to endocrine disorders: Secondary | ICD-10-CM

## 2021-01-18 DIAGNOSIS — E785 Hyperlipidemia, unspecified: Secondary | ICD-10-CM

## 2021-01-18 DIAGNOSIS — F17200 Nicotine dependence, unspecified, uncomplicated: Secondary | ICD-10-CM

## 2021-01-18 LAB — POCT GLYCOSYLATED HEMOGLOBIN (HGB A1C): HbA1c, POC (controlled diabetic range): 10.9 % — AB (ref 0.0–7.0)

## 2021-01-18 LAB — GLUCOSE, POCT (MANUAL RESULT ENTRY): POC Glucose: 318 mg/dl — AB (ref 70–99)

## 2021-01-18 MED ORDER — AMLODIPINE BESYLATE 10 MG PO TABS
ORAL_TABLET | Freq: Every day | ORAL | 6 refills | Status: DC
  Filled 2021-01-18: qty 30, 30d supply, fill #0

## 2021-01-18 MED ORDER — METFORMIN HCL 1000 MG PO TABS
1000.0000 mg | ORAL_TABLET | Freq: Two times a day (BID) | ORAL | 2 refills | Status: DC
  Filled 2021-01-18: qty 60, 30d supply, fill #0
  Filled 2021-02-25: qty 60, 30d supply, fill #1

## 2021-01-18 MED ORDER — LISINOPRIL 10 MG PO TABS
10.0000 mg | ORAL_TABLET | Freq: Every day | ORAL | 6 refills | Status: DC
  Filled 2021-01-18: qty 30, 30d supply, fill #0
  Filled 2021-02-25: qty 30, 30d supply, fill #1

## 2021-01-18 MED ORDER — ATORVASTATIN CALCIUM 40 MG PO TABS
ORAL_TABLET | ORAL | 6 refills | Status: DC
  Filled 2021-01-18: qty 45, 30d supply, fill #0

## 2021-01-18 MED ORDER — TRULICITY 1.5 MG/0.5ML ~~LOC~~ SOAJ
1.5000 mg | SUBCUTANEOUS | 6 refills | Status: DC
  Filled 2021-01-18 – 2021-02-04 (×2): qty 2, 28d supply, fill #0
  Filled 2021-03-18: qty 2, 28d supply, fill #1
  Filled 2021-04-30: qty 2, 28d supply, fill #0
  Filled 2021-05-28: qty 2, 28d supply, fill #1
  Filled 2021-06-25: qty 2, 28d supply, fill #2
  Filled 2021-08-06: qty 2, 28d supply, fill #3
  Filled 2021-09-17: qty 2, 28d supply, fill #4

## 2021-01-18 NOTE — Patient Instructions (Signed)
Please call and schedule your eye exam before the end of the year.  Use a heating pad and Voltaren gel to your back.  Voltaren gel can be purchased over the counter.  Diabetes Mellitus and Nutrition, Adult When you have diabetes, or diabetes mellitus, it is very important to have healthy eating habits because your blood sugar (glucose) levels are greatly affected by what you eat and drink. Eating healthy foods in the right amounts, at about the same times every day, can help you: Control your blood glucose. Lower your risk of heart disease. Improve your blood pressure. Reach or maintain a healthy weight. What can affect my meal plan? Every person with diabetes is different, and each person has different needs for a meal plan. Your health care provider may recommend that you work with a dietitian to make a meal plan that is best for you. Your meal plan may vary depending on factors such as: The calories you need. The medicines you take. Your weight. Your blood glucose, blood pressure, and cholesterol levels. Your activity level. Other health conditions you have, such as heart or kidney disease. How do carbohydrates affect me? Carbohydrates, also called carbs, affect your blood glucose level more than any other type of food. Eating carbs naturally raises the amount of glucose in your blood. Carb counting is a method for keeping track of how many carbs you eat. Counting carbs is important to keep your blood glucose at a healthy level, especially if you use insulin or take certain oral diabetes medicines. It is important to know how many carbs you can safely have in each meal. This is different for every person. Your dietitian can help you calculate how many carbs you should have at each meal and for each snack. How does alcohol affect me? Alcohol can cause a sudden decrease in blood glucose (hypoglycemia), especially if you use insulin or take certain oral diabetes medicines. Hypoglycemia can be a  life-threatening condition. Symptoms of hypoglycemia, such as sleepiness, dizziness, and confusion, are similar to symptoms of having too much alcohol. Do not drink alcohol if: Your health care provider tells you not to drink. You are pregnant, may be pregnant, or are planning to become pregnant. If you drink alcohol: Do not drink on an empty stomach. Limit how much you use to: 0-1 drink a day for women. 0-2 drinks a day for men. Be aware of how much alcohol is in your drink. In the [place] one drink equals one 12 oz bottle of beer (355 mL), one 5 oz glass of wine (148 mL), or one 1 oz glass of hard liquor (44 mL). Keep yourself hydrated with water, diet soda, or unsweetened iced tea. Keep in mind that regular soda, juice, and other mixers may contain a lot of sugar and must be counted as carbs. What are tips for following this plan? Reading food labels Start by checking the serving size on the "Nutrition Facts" label of packaged foods and drinks. The amount of calories, carbs, fats, and other nutrients listed on the label is based on one serving of the item. Many items contain more than one serving per package. Check the total grams (g) of carbs in one serving. You can calculate the number of servings of carbs in one serving by dividing the total carbs by 15. For example, if a food has 30 g of total carbs per serving, it would be equal to 2 servings of carbs. Check the number of grams (g) of saturated fats  and trans fats in one serving. Choose foods that have a low amount or none of these fats. Check the number of milligrams (mg) of salt (sodium) in one serving. Most people should limit total sodium intake to less than 2,300 mg per day. Always check the nutrition information of foods labeled as "low-fat" or "nonfat." These foods may be higher in added sugar or refined carbs and should be avoided. Talk to your dietitian to identify your daily goals for nutrients listed on the  label. Shopping Avoid buying canned, pre-made, or processed foods. These foods tend to be high in fat, sodium, and added sugar. Shop around the outside edge of the grocery store. This is where you will most often find fresh fruits and vegetables, bulk grains, fresh meats, and fresh dairy. Cooking Use low-heat cooking methods, such as baking, instead of high-heat cooking methods like deep frying. Cook using healthy oils, such as olive, canola, or sunflower oil. Avoid cooking with butter, cream, or high-fat meats. Meal planning Eat meals and snacks regularly, preferably at the same times every day. Avoid going long periods of time without eating. Eat foods that are high in fiber, such as fresh fruits, vegetables, beans, and whole grains. Talk with your dietitian about how many servings of carbs you can eat at each meal. Eat 4-6 oz (112-168 g) of lean protein each day, such as lean meat, chicken, fish, eggs, or tofu. One ounce (oz) of lean protein is equal to: 1 oz (28 g) of meat, chicken, or fish. 1 egg.  cup (62 g) of tofu. Eat some foods each day that contain healthy fats, such as avocado, nuts, seeds, and fish. What foods should I eat? Fruits Berries. Apples. Oranges. Peaches. Apricots. Plums. Grapes. Mango. Papaya. Pomegranate. Kiwi. Cherries. Vegetables Lettuce. Spinach. Leafy greens, including kale, chard, collard greens, and mustard greens. Beets. Cauliflower. Cabbage. Broccoli. Carrots. Green beans. Tomatoes. Peppers. Onions. Cucumbers. Brussels sprouts. Grains Whole grains, such as whole-wheat or whole-grain bread, crackers, tortillas, cereal, and pasta. Unsweetened oatmeal. Quinoa. Brown or wild rice. Meats and other proteins Seafood. Poultry without skin. Lean cuts of poultry and beef. Tofu. Nuts. Seeds. Dairy Low-fat or fat-free dairy products such as milk, yogurt, and cheese. The items listed above may not be a complete list of foods and beverages you can eat. Contact a  dietitian for more information. What foods should I avoid? Fruits Fruits canned with syrup. Vegetables Canned vegetables. Frozen vegetables with butter or cream sauce. Grains Refined white flour and flour products such as bread, pasta, snack foods, and cereals. Avoid all processed foods. Meats and other proteins Fatty cuts of meat. Poultry with skin. Breaded or fried meats. Processed meat. Avoid saturated fats. Dairy Full-fat yogurt, cheese, or milk. Beverages Sweetened drinks, such as soda or iced tea. The items listed above may not be a complete list of foods and beverages you should avoid. Contact a dietitian for more information. Questions to ask a health care provider Do I need to meet with a diabetes educator? Do I need to meet with a dietitian? What number can I call if I have questions? When are the best times to check my blood glucose? Where to find more information: American Diabetes Association: diabetes.org Academy of Nutrition and Dietetics: www.eatright.[employer] of Diabetes and Digestive and Kidney Diseases: CarFlippers.tn Association of Diabetes Care and Education Specialists: www.diabeteseducator.org Summary It is important to have healthy eating habits because your blood sugar (glucose) levels are greatly affected by what you eat and drink.  A healthy meal plan will help you control your blood glucose and maintain a healthy lifestyle. Your health care provider may recommend that you work with a dietitian to make a meal plan that is best for you. Keep in mind that carbohydrates (carbs) and alcohol have immediate effects on your blood glucose levels. It is important to count carbs and to use alcohol carefully. This information is not intended to replace advice given to you by your health care provider. Make sure you discuss any questions you have with your health care provider. Document Revised: 02/19/2019 Document Reviewed: 02/19/2019 Elsevier Patient  Education  2021 [employer].
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
## 2021-01-19 ENCOUNTER — Other Ambulatory Visit: Payer: Self-pay

## 2021-02-04 ENCOUNTER — Other Ambulatory Visit: Payer: Self-pay

## 2021-02-24 ENCOUNTER — Other Ambulatory Visit: Payer: Self-pay | Admitting: Internal Medicine

## 2021-02-24 DIAGNOSIS — E1159 Type 2 diabetes mellitus with other circulatory complications: Secondary | ICD-10-CM

## 2021-02-24 DIAGNOSIS — E1169 Type 2 diabetes mellitus with other specified complication: Secondary | ICD-10-CM

## 2021-02-24 NOTE — Telephone Encounter (Signed)
Medication Refill - Medication: lisinopril (ZESTRIL) 10 MG tablet metFORMIN (GLUCOPHAGE) 1000 MG tablet  Has the patient contacted their pharmacy? Yes.   (Agent: If no, request that the patient contact the pharmacy for the refill. If patient does not wish to contact the pharmacy document the reason why and proceed with request.) (Agent: If yes, when and what did the pharmacy advise?)  Preferred Pharmacy (with phone number or street name): [facility] Has the patient been seen for an appointment in the last year OR does the patient have an upcoming appointment? Yes.    Agent: Please be advised that RX refills may take up to 3 business days. We ask that you follow-up with your pharmacy.

## 2021-02-25 ENCOUNTER — Other Ambulatory Visit: Payer: Self-pay

## 2021-02-25 NOTE — Telephone Encounter (Signed)
Lisinopril refilled 01/18/2021#30 with 6 refills. Metformin refilled 01/18/2021 #180 with 2 refills. Pt should have an ample supply.

## 2021-02-27 NOTE — Telephone Encounter (Signed)
Requested medication (s) are due for refill today: no  Requested medication (s) are on the active medication list: yes  Last refill:  01/18/21 for both.   Lisinopril: 01/18/21 #30 6 RF  Future visit scheduled: yes  Notes to clinic:  [facility] pharmacy has sent a request for these meds twice.  Too early to refill.   Requested Prescriptions  Pending Prescriptions Disp Refills   lisinopril (ZESTRIL) 10 MG tablet 30 tablet 6    Sig: Take 1 tablet (10 mg total) by mouth daily.     Cardiovascular:  ACE Inhibitors Failed - 02/25/2021  3:25 AM      Failed - Cr in normal range and within 180 days    Creat  Date Value Ref Range Status  03/17/2016 0.89 0.60 - 1.35 mg/dL Final   Creatinine, Ser  Date Value Ref Range Status  03/02/2020 0.96 0.76 - 1.27 mg/dL Final   Creatinine, Urine  Date Value Ref Range Status  03/11/2016 21.75 mg/dL Final    Comment:    Performed at [facility]          Failed - K in normal range and within 180 days    Potassium  Date Value Ref Range Status  03/02/2020 4.2 3.5 - 5.2 mmol/L Final          Failed - Last BP in normal range    BP Readings from Last 1 Encounters:  01/18/21 136/90          Passed - Patient is not pregnant      Passed - Valid encounter within last 6 months    Recent Outpatient Visits           1 month ago Diabetes mellitus type 2 in obese [facility])   [place] Karle Plumber B, MD   5 months ago Diabetes mellitus type 2 in obese [facility])   [place] Karle Plumber B, MD   6 months ago Diabetes mellitus type 2 in obese [facility])   [place], Annie Main L, RPH-CPP   7 months ago Diabetes mellitus type 2 in obese [facility])   [place], Annie Main L, RPH-CPP   8 months ago Diabetes mellitus type 2 in obese [facility])   [place], RPH-CPP        Future Appointments             In 2 months Ladell Pier, MD [place]             metFORMIN (GLUCOPHAGE) 1000 MG tablet 180 tablet 2    Sig: Take 1 tablet (1,000 mg total) by mouth 2 (two) times daily.     Endocrinology:  Diabetes - Biguanides Failed - 02/25/2021  3:25 AM      Failed - HBA1C is between 0 and 7.9 and within 180 days    HbA1c, POC (controlled diabetic range)  Date Value Ref Range Status  01/18/2021 10.9 (A) 0.0 - 7.0 % Final          Passed - Cr in normal range and within 360 days    Creat  Date Value Ref Range Status  03/17/2016 0.89 0.60 - 1.35 mg/dL Final   Creatinine, Ser  Date Value Ref Range Status  03/02/2020 0.96 0.76 - 1.27 mg/dL Final   Creatinine, Urine  Date Value  Ref Range Status  03/11/2016 21.75 mg/dL Final    Comment:    Performed at [place] eGFR in normal range and within 360 days    GFR calc Af Amer  Date Value Ref Range Status  03/02/2020 115 >59 mL/min/1.73 Final    Comment:    **In accordance with recommendations from the NKF-ASN Task force,**   [facility] is in the process of updating its eGFR calculation to the   2021 CKD-EPI creatinine equation that estimates kidney function   without a race variable.    GFR calc non Af Amer  Date Value Ref Range Status  03/02/2020 99 >59 mL/min/1.73 Final          Passed - Valid encounter within last 6 months    Recent Outpatient Visits           1 month ago Diabetes mellitus type 2 in obese [facility])   [place] Karle Plumber B, MD   5 months ago Diabetes mellitus type 2 in obese [facility])   [place], MD   6 months ago Diabetes mellitus type 2 in obese [facility])   [place], Annie Main L, RPH-CPP   7 months ago Diabetes mellitus type 2 in obese [facility])   [place], Jarome Matin, RPH-CPP   8 months ago Diabetes mellitus type 2 in obese [facility])   [place], RPH-CPP       Future Appointments             In 2 months Wynetta Emery Dalbert Batman, MD [place]

## 2021-03-01 ENCOUNTER — Other Ambulatory Visit: Payer: Self-pay

## 2021-03-01 MED ORDER — LISINOPRIL 10 MG PO TABS
10.0000 mg | ORAL_TABLET | Freq: Every day | ORAL | 6 refills | Status: DC
  Filled 2021-03-01: qty 30, 30d supply, fill #0

## 2021-03-01 MED ORDER — METFORMIN HCL 1000 MG PO TABS
1000.0000 mg | ORAL_TABLET | Freq: Two times a day (BID) | ORAL | 2 refills | Status: DC
  Filled 2021-03-01: qty 180, 90d supply, fill #0

## 2021-03-18 ENCOUNTER — Other Ambulatory Visit: Payer: Self-pay

## 2021-04-24 ENCOUNTER — Other Ambulatory Visit: Payer: Self-pay | Admitting: Internal Medicine

## 2021-04-24 DIAGNOSIS — E1159 Type 2 diabetes mellitus with other circulatory complications: Secondary | ICD-10-CM

## 2021-04-24 DIAGNOSIS — E785 Hyperlipidemia, unspecified: Secondary | ICD-10-CM

## 2021-04-24 DIAGNOSIS — E1169 Type 2 diabetes mellitus with other specified complication: Secondary | ICD-10-CM

## 2021-04-24 DIAGNOSIS — E669 Obesity, unspecified: Secondary | ICD-10-CM

## 2021-04-25 NOTE — Telephone Encounter (Signed)
Requested Prescriptions  Pending Prescriptions Disp Refills   amLODipine (NORVASC) 5 MG tablet [Pharmacy Med Name: AMLODIPINE BESYLATE 5 MG TAB] 90 tablet 1    Sig: TAKE 1 TABLET EVERY DAY     Cardiovascular:  Calcium Channel Blockers Failed - 04/24/2021  5:10 PM      Failed - Last BP in normal range    BP Readings from Last 1 Encounters:  01/18/21 136/90         Passed - Valid encounter within last 6 months    Recent Outpatient Visits          3 months ago Diabetes mellitus type 2 in obese [facility])   [place], Neoma Laming B, MD   7 months ago Diabetes mellitus type 2 in obese [facility])   [place] Karle Plumber B, MD   8 months ago Diabetes mellitus type 2 in obese [facility])   [place], Annie Main L, RPH-CPP   9 months ago Diabetes mellitus type 2 in obese [facility])   [place], Annie Main L, RPH-CPP   10 months ago Diabetes mellitus type 2 in obese [facility])   [place], RPH-CPP      Future Appointments            In 4 weeks Ladell Pier, MD [place]            lisinopril (ZESTRIL) 10 MG tablet [Pharmacy Med Name: LISINOPRIL 10 MG TABLET] 90 tablet 0    Sig: TAKE 1 TABLET EVERY DAY     Cardiovascular:  ACE Inhibitors Failed - 04/24/2021  5:10 PM      Failed - Cr in normal range and within 180 days    Creat  Date Value Ref Range Status  03/17/2016 0.89 0.60 - 1.35 mg/dL Final   Creatinine, Ser  Date Value Ref Range Status  03/02/2020 0.96 0.76 - 1.27 mg/dL Final   Creatinine, Urine  Date Value Ref Range Status  03/11/2016 21.75 mg/dL Final    Comment:    Performed at [facility]         Failed - K in normal range and within 180 days    Potassium  Date Value Ref Range Status  03/02/2020 4.2 3.5 - 5.2 mmol/L Final          Failed - Last BP in normal range    BP Readings from Last 1 Encounters:  01/18/21 136/90         Passed - Patient is not pregnant      Passed - Valid encounter within last 6 months    Recent Outpatient Visits          3 months ago Diabetes mellitus type 2 in obese [facility])   [place] Karle Plumber B, MD   7 months ago Diabetes mellitus type 2 in obese [facility])   [place] Karle Plumber B, MD   8 months ago Diabetes mellitus type 2 in obese [facility])   [place], Annie Main L, RPH-CPP   9 months ago Diabetes mellitus type 2 in obese [facility])   [place], Annie Main L, RPH-CPP   10 months ago Diabetes mellitus type 2 in obese [facility])   [facility]  [place], RPH-CPP      Future Appointments            In 4 weeks Ladell Pier, MD [place]            atorvastatin (LIPITOR) 40 MG tablet [Pharmacy Med Name: ATORVASTATIN 40 MG TABLET] 90 tablet 1    Sig: TAKE 1 TABLET EVERY DAY     Cardiovascular:  Antilipid - Statins Failed - 04/24/2021  5:10 PM      Failed - LDL in normal range and within 360 days    LDL Chol Calc (NIH)  Date Value Ref Range Status  05/18/2020 113 (H) 0 - 99 mg/dL Final         Failed - HDL in normal range and within 360 days    HDL  Date Value Ref Range Status  05/18/2020 39 (L) >39 mg/dL Final         Passed - Total Cholesterol in normal range and within 360 days    Cholesterol, Total  Date Value Ref Range Status  05/18/2020 175 100 - 199 mg/dL Final         Passed - Triglycerides in normal range and within 360 days    Triglycerides  Date Value Ref Range Status  05/18/2020 125 0 - 149 mg/dL Final         Passed - Patient is not pregnant      Passed - Valid encounter within last 12 months    Recent Outpatient Visits           3 months ago Diabetes mellitus type 2 in obese [facility])   [place], Deborah B, MD   7 months ago Diabetes mellitus type 2 in obese [facility])   [place], Deborah B, MD   8 months ago Diabetes mellitus type 2 in obese [facility])   [place], Annie Main L, RPH-CPP   9 months ago Diabetes mellitus type 2 in obese [facility])   [place], Annie Main L, RPH-CPP   10 months ago Diabetes mellitus type 2 in obese [facility])   [place], RPH-CPP      Future Appointments            In 4 weeks Ladell Pier, MD [place] 100 UNIT/ML [employer] Med Name: LANTUS SOLOSTAR 100 UNIT/ML]  5    Sig: INJECT 20 UNITS INTO SKIN DAILY     Endocrinology:  Diabetes - Insulins Failed - 04/24/2021  5:10 PM      Failed - HBA1C is between 0 and 7.9 and within 180 days    HbA1c, POC (controlled diabetic range)  Date Value Ref Range Status  01/18/2021 10.9 (A) 0.0 - 7.0 % Final         Passed - Valid encounter within last 6 months    Recent Outpatient Visits          3 months ago Diabetes mellitus type 2 in obese [facility])   [place] Karle Plumber B, MD   7 months ago Diabetes mellitus type 2 in obese [facility])   [facility] Ladell Pier, MD  8 months ago Diabetes mellitus type 2 in obese [facility])   [place], Annie Main L, RPH-CPP   9 months ago Diabetes mellitus type 2 in obese [facility])   [place], Jarome Matin, RPH-CPP   10 months ago Diabetes mellitus type 2 in obese [facility])   [place], RPH-CPP      Future Appointments            In 4  weeks Ladell Pier, MD [place]

## 2021-04-25 NOTE — Telephone Encounter (Signed)
Requested medication (s) are due for refill today: yes  Requested medication (s) are on the active medication list: yes norvasc, no lantus   Last refill:  norvasc- 09/04/20-09/04/21 #30 6 refills , lantus solostar not on medication list   Future visit scheduled: yes in 4 weeks  Notes to clinic:  no provider listed ordering norvasc. Over due labs 03/02/20. Lantus solostar not on medication list. Do you want to refill Rx?     Requested Prescriptions  Pending Prescriptions Disp Refills   amLODipine (NORVASC) 5 MG tablet [Pharmacy Med Name: AMLODIPINE BESYLATE 5 MG TAB] 90 tablet 1    Sig: TAKE 1 TABLET EVERY DAY     Cardiovascular:  Calcium Channel Blockers Failed - 04/24/2021  5:10 PM      Failed - Last BP in normal range    BP Readings from Last 1 Encounters:  01/18/21 136/90          Passed - Valid encounter within last 6 months    Recent Outpatient Visits           3 months ago Diabetes mellitus type 2 in obese [facility](HCC)   [place] [facility] Jonah BlueJohnson, Deborah B, MD   7 months ago Diabetes mellitus type 2 in obese [facility](HCC)   [place] [facility] Jonah BlueJohnson, Deborah B, MD   8 months ago Diabetes mellitus type 2 in obese [facility](HCC)   [place] [facility] [place], Jeannett SeniorStephen L, RPH-CPP   9 months ago Diabetes mellitus type 2 in obese [facility](HCC)   [facility] [place], Jeannett SeniorStephen L, RPH-CPP   10 months ago Diabetes mellitus type 2 in obese [facility](HCC)   [place] [facility] Drucilla ChaletVan Ausdall, Stephen L, RPH-CPP       Future Appointments             In 4 weeks Marcine MatarJohnson, Deborah B, MD [facility]             LANTUS SOLOSTAR 100 UNIT/ML Solostar Pen [employer][Pharmacy Med Name: LANTUS SOLOSTAR 100 UNIT/ML]  5    Sig: INJECT 20 UNITS INTO SKIN DAILY     Endocrinology:  Diabetes - Insulins Failed - 04/24/2021  5:10 PM      Failed - HBA1C is between 0 and 7.9 and within  180 days    HbA1c, POC (controlled diabetic range)  Date Value Ref Range Status  01/18/2021 10.9 (A) 0.0 - 7.0 % Final          Passed - Valid encounter within last 6 months    Recent Outpatient Visits           3 months ago Diabetes mellitus type 2 in obese [facility](HCC)   [place] [facility] Jonah BlueJohnson, Deborah B, MD   7 months ago Diabetes mellitus type 2 in obese [facility](HCC)   [place] [facility] Jonah BlueJohnson, Deborah B, MD   8 months ago Diabetes mellitus type 2 in obese [facility](HCC)   [place] [facility] [place], Jeannett SeniorStephen L, RPH-CPP   9 months ago Diabetes mellitus type 2 in obese [facility](HCC)   [place] [facility] [place] Jeannett SeniorStephen L, RPH-CPP   10 months ago Diabetes mellitus type 2 in obese [facility](HCC)   [facility] Lo[place] Hux[place]Van Ausdall, Cornelius MorasStephen L, RPH-CPP       Future Appointments  In 4 weeks Marcine Matar, MD [facility]            Signed Prescriptions Disp Refills   lisinopril (ZESTRIL) 10 MG tablet 90 tablet 0    Sig: TAKE 1 TABLET EVERY DAY     Cardiovascular:  ACE Inhibitors Failed - 04/24/2021  5:10 PM      Failed - Cr in normal range and within 180 days    Creat  Date Value Ref Range Status  03/17/2016 0.89 0.60 - 1.35 mg/dL Final   Creatinine, Ser  Date Value Ref Range Status  03/02/2020 0.96 0.76 - 1.27 mg/dL Final   Creatinine, Urine  Date Value Ref Range Status  03/11/2016 21.75 mg/dL Final    Comment:    Performed at [facility]          Failed - K in normal range and within 180 days    Potassium  Date Value Ref Range Status  03/02/2020 4.2 3.5 - 5.2 mmol/L Final          Failed - Last BP in normal range    BP Readings from Last 1 Encounters:  01/18/21 136/90          Passed - Patient is not pregnant      Passed - Valid encounter within last 6 months    Recent Outpatient Visits           3 months  ago Diabetes mellitus type 2 in obese [facility])   [place] [facility] Marcine Matar, MD   7 months ago Diabetes mellitus type 2 in obese [facility])   [place] [facility] Marcine Matar, MD   8 months ago Diabetes mellitus type 2 in obese [facility])   [place] [facility] [place], Jeannett Senior L, RPH-CPP   9 months ago Diabetes mellitus type 2 in obese [facility])   [place] [facility] [place], Jeannett Senior L, RPH-CPP   10 months ago Diabetes mellitus type 2 in obese [facility])   [place] [facility] Drucilla Chalet, RPH-CPP       Future Appointments             In 4 weeks Marcine Matar, MD [place] [facility]             atorvastatin (LIPITOR) 40 MG tablet 90 tablet 1    Sig: TAKE 1 TABLET EVERY DAY     Cardiovascular:  Antilipid - Statins Failed - 04/24/2021  5:10 PM      Failed - LDL in normal range and within 360 days    LDL Chol Calc (NIH)  Date Value Ref Range Status  05/18/2020 113 (H) 0 - 99 mg/dL Final          Failed - HDL in normal range and within 360 days    HDL  Date Value Ref Range Status  05/18/2020 39 (L) >39 mg/dL Final          Passed - Total Cholesterol in normal range and within 360 days    Cholesterol, Total  Date Value Ref Range Status  05/18/2020 175 100 - 199 mg/dL Final          Passed - Triglycerides in normal range and within 360 days    Triglycerides  Date Value Ref Range Status  05/18/2020 125 0 - 149 mg/dL Final  Passed - Patient is not pregnant      Passed - Valid encounter within last 12 months    Recent Outpatient Visits           3 months ago Diabetes mellitus type 2 in obese [facility])   [place] [facility] Jonah Blue B, MD   7 months ago Diabetes mellitus type 2 in obese [facility])   [place] [facility] Jonah Blue B, MD   8 months ago  Diabetes mellitus type 2 in obese [facility])   [facility] [place], Jeannett Senior L, RPH-CPP   9 months ago Diabetes mellitus type 2 in obese [facility])   [facility] [place], Cornelius Moras, RPH-CPP   10 months ago Diabetes mellitus type 2 in obese [facility])   [facility] Lois Huxley, Cornelius Moras, RPH-CPP       Future Appointments             In 4 weeks Marcine Matar, [facility]

## 2021-04-30 ENCOUNTER — Other Ambulatory Visit: Payer: Self-pay

## 2021-05-04 ENCOUNTER — Other Ambulatory Visit: Payer: Self-pay | Admitting: Internal Medicine

## 2021-05-04 DIAGNOSIS — E1169 Type 2 diabetes mellitus with other specified complication: Secondary | ICD-10-CM

## 2021-05-04 DIAGNOSIS — E669 Obesity, unspecified: Secondary | ICD-10-CM

## 2021-05-04 NOTE — Telephone Encounter (Signed)
Will forward to provider. Don't see Lantus on med list. Pt has refills on Norvasc

## 2021-05-05 NOTE — Telephone Encounter (Signed)
Notes to clinic:  pharm request as follows: Pharmacy comment: Alternative Requested.   Requested Prescriptions  Pending Prescriptions Disp Refills   TRESIBA FLEXTOUCH 100 UNIT/ML FlexTouch Pen [Pharmacy Med Name: TRESIBA FLEXTOUCH 100 UNIT/ML]  0     Endocrinology:  Diabetes - Insulins Failed - 05/04/2021  9:19 PM      Failed - HBA1C is between 0 and 7.9 and within 180 days    HbA1c, POC (controlled diabetic range)  Date Value Ref Range Status  01/18/2021 10.9 (A) 0.0 - 7.0 % Final          Passed - Valid encounter within last 6 months    Recent Outpatient Visits           3 months ago Diabetes mellitus type 2 in obese [facility])   [place] [facility] Jonah Blue B, MD   7 months ago Diabetes mellitus type 2 in obese [facility])   [place] [facility] Jonah Blue B, MD   8 months ago Diabetes mellitus type 2 in obese [facility])   [facility] [place], Jeannett Senior L, RPH-CPP   9 months ago Diabetes mellitus type 2 in obese [facility])   [facility] [place], Jeannett Senior L, RPH-CPP   10 months ago Diabetes mellitus type 2 in obese [facility])   [facility] Lois Huxley, Cornelius Moras, RPH-CPP       Future Appointments             In 2 weeks Marcine Matar, MD [facility]

## 2021-05-24 ENCOUNTER — Other Ambulatory Visit: Payer: Self-pay

## 2021-05-24 ENCOUNTER — Ambulatory Visit: Payer: PRIVATE HEALTH INSURANCE | Attending: Internal Medicine | Admitting: Internal Medicine

## 2021-05-24 DIAGNOSIS — I152 Hypertension secondary to endocrine disorders: Secondary | ICD-10-CM | POA: Diagnosis not present

## 2021-05-24 DIAGNOSIS — E1159 Type 2 diabetes mellitus with other circulatory complications: Secondary | ICD-10-CM | POA: Diagnosis not present

## 2021-05-24 DIAGNOSIS — E669 Obesity, unspecified: Secondary | ICD-10-CM | POA: Diagnosis not present

## 2021-05-24 DIAGNOSIS — E785 Hyperlipidemia, unspecified: Secondary | ICD-10-CM

## 2021-05-24 DIAGNOSIS — E1169 Type 2 diabetes mellitus with other specified complication: Secondary | ICD-10-CM | POA: Diagnosis not present

## 2021-05-24 MED ORDER — BASAGLAR KWIKPEN 100 UNIT/ML ~~LOC~~ SOPN
22.0000 [IU] | PEN_INJECTOR | Freq: Every day | SUBCUTANEOUS | 4 refills | Status: DC
  Filled 2021-05-24: qty 6, 27d supply, fill #0

## 2021-05-24 NOTE — Progress Notes (Signed)
Patient ID: William Martin, male   DOB: 27-Sep-1980, 41 y.o.   MRN: [id number] Virtual Visit via Telephone Note  I connected with Coral Clegg on 05/24/2021 at 8:34 a.m by telephone and verified that I am speaking with the correct person using two identifiers  Location: Patient: home Provider: office  Participants: Myself Patient   I discussed the limitations, risks, security and privacy concerns of performing an evaluation and management service by telephone and the availability of in person appointments. I also discussed with the patient that there may be a patient responsible charge related to this service. The patient expressed understanding and agreed to proceed.   History of Present Illness: Patient with history of DM, HTN, HL, obesity, tobacco dependence (cigars). This is for chronic ds management.  DM: Reports compliance with Lantus 22 units once a day, Metformin 1 gram twice a day and Trulicity 1.5 mg once  -Not checking BS often as he should.  Checked once last wk Getting better with eating habits - more white meat, less fried foods Does a lot of walking at work. 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  Reports he had some issues with blocked eye duct. Plans to get routine eye exam this Friday.  HYPERTENSION Currently taking: see medication list.  On Norvasc and Lisinopril Med Adherence: [x]  Yes    []  No Medication side effects: []  Yes    [x]  No Adherence with salt restriction: [x]  Yes    []  No Home Monitoring?: []  Yes    [x]  No Monitoring Frequency:  Home BP results range:  SOB? []  Yes    [x]  No Chest Pain?: []  Yes    [x]  No Leg swelling?: []  Yes    [x]  No Headaches?: []  Yes    [x]  No Dizziness? []  Yes    [x]  No Comments:   HL:  taking and tolerating Lipitor  Outpatient Encounter Medications as of 05/24/2021  Medication Sig   amLODipine (NORVASC) 10 MG tablet TAKE  1 TABLET (10 MG TOTAL) BY MOUTH DAILY.   amLODipine (NORVASC) 5 MG tablet TAKE 1 TABLET EVERY DAY   Aspirin-Salicylamide-Caffeine (BC HEADACHE POWDER PO) Take 1 packet by mouth daily as needed (headache).   atorvastatin (LIPITOR) 40 MG tablet TAKE 1 TABLET EVERY DAY   B-D INS SYR ULTRAFINE 1CC/30G 30G X 1/2" 1 ML MISC USE AS DIRECTED TWICE A DAY   Dulaglutide (TRULICITY) 1.5 0000000 SOPN INJECT 1.5 MG INTO THE SKIN ONCE A WEEK.   insulin degludec (TRESIBA FLEXTOUCH) 100 UNIT/ML FlexTouch Pen Inject 20 Units into the skin daily.   Insulin Pen Needle 31G X 8 MM MISC Use as directed   lisinopril (ZESTRIL) 10 MG tablet TAKE 1 TABLET EVERY DAY   metFORMIN (GLUCOPHAGE) 1000 MG tablet Take 1 tablet (1,000 mg total) by mouth 2 (two) times daily.   omeprazole (PRILOSEC) 20 MG capsule TAKE 1 CAPSULE EVERY DAY AS NEEDED FOR HEARTBURN (Patient taking differently: Take 20 mg by mouth daily as needed (heartburn).)   No facility-administered encounter medications on file as of 05/24/2021.      Observations/Objective: Results for orders placed or performed in visit on 01/18/21  POCT glucose (manual entry)  Result Value Ref Range   POC Glucose 318 (A) 70 - 99 mg/dl  POCT glycosylated hemoglobin (Hb A1C)  Result Value Ref Range   Hemoglobin A1C     HbA1c POC (<>  result, manual entry)     HbA1c, POC (prediabetic range)     HbA1c, POC (controlled diabetic range) 10.9 (A) 0.0 - 7.0 %     Assessment and Plan: 1. Diabetes mellitus type 2 in obese (HCC) Level of control unknown as patient has not been checking blood sugars.  Encouraged him to check the blood sugars at least twice a day before meals and to record his readings.  We will have him follow-up with the clinical pharmacist in 1 month.  In the meantime he will continue Basaglar 22 units, Trulicity and Metformin -Commended him on changes made in his eating habits.  Encouraged him to get in some exercise outside of work. - CBC; Future -  Comprehensive metabolic panel; Future - Lipid panel; Future - Hemoglobin A1c; Future - Microalbumin / creatinine urine ratio; Future  2. Hypertension associated with diabetes ([place]) Patient to continue Norvasc and lisinopril.  3. Hyperlipidemia associated with type 2 diabetes mellitus (HCC) Continue atorvastatin.   Follow Up Instructions: 4 mths Follow-up with clinical pharmacist in 3 to 4 weeks for recheck of diabetes. Patient to come to the lab to have blood test done.   I discussed the assessment and treatment plan with the patient. The patient was provided an opportunity to ask questions and all were answered. The patient agreed with the plan and demonstrated an understanding of the instructions.   The patient was advised to call back or seek an in-person evaluation if the symptoms worsen or if the condition fails to improve as anticipated.  I  Spent 13 minutes on this telephone encounter  This note has been created with Surveyor, quantity. Any transcriptional errors are unintentional.  Karle Plumber, MD

## 2021-05-26 ENCOUNTER — Telehealth: Payer: Self-pay

## 2021-05-26 NOTE — Telephone Encounter (Signed)
-----   Message from Marcine Matar, MD sent at 05/24/2021  8:49 AM EST ----- ?Follow-up with me in 4 months. ?Give appointment with clinical pharmacist in 3 to 4 weeks for follow-up diabetes. ?Give appointment with lab next Monday. ? ?

## 2021-05-28 ENCOUNTER — Other Ambulatory Visit: Payer: Self-pay

## 2021-05-31 ENCOUNTER — Other Ambulatory Visit: Payer: Self-pay

## 2021-05-31 ENCOUNTER — Ambulatory Visit: Payer: PRIVATE HEALTH INSURANCE | Attending: Internal Medicine

## 2021-05-31 DIAGNOSIS — E669 Obesity, unspecified: Secondary | ICD-10-CM

## 2021-05-31 DIAGNOSIS — E1169 Type 2 diabetes mellitus with other specified complication: Secondary | ICD-10-CM

## 2021-06-01 LAB — CBC
Hematocrit: 43.6 % (ref 37.5–51.0)
Hemoglobin: 14.3 g/dL (ref 13.0–17.7)
MCH: 26.1 pg — ABNORMAL LOW (ref 26.6–33.0)
MCHC: 32.8 g/dL (ref 31.5–35.7)
MCV: 80 fL (ref 79–97)
Platelets: 286 10*3/uL (ref 150–450)
RBC: 5.48 x10E6/uL (ref 4.14–5.80)
RDW: 15.8 % — ABNORMAL HIGH (ref 11.6–15.4)
WBC: 9.3 10*3/uL (ref 3.4–10.8)

## 2021-06-01 LAB — MICROALBUMIN / CREATININE URINE RATIO
Creatinine, Urine: 80.3 mg/dL
Microalb/Creat Ratio: 35 mg/g creat — ABNORMAL HIGH (ref 0–29)
Microalbumin, Urine: 28.4 ug/mL

## 2021-06-01 LAB — COMPREHENSIVE METABOLIC PANEL
ALT: 43 IU/L (ref 0–44)
AST: 32 IU/L (ref 0–40)
Albumin/Globulin Ratio: 1.5 (ref 1.2–2.2)
Albumin: 4.6 g/dL (ref 4.0–5.0)
Alkaline Phosphatase: 117 IU/L (ref 44–121)
BUN/Creatinine Ratio: 12 (ref 9–20)
BUN: 12 mg/dL (ref 6–24)
Bilirubin Total: 0.7 mg/dL (ref 0.0–1.2)
CO2: 26 mmol/L (ref 20–29)
Calcium: 9.7 mg/dL (ref 8.7–10.2)
Chloride: 99 mmol/L (ref 96–106)
Creatinine, Ser: 0.97 mg/dL (ref 0.76–1.27)
Globulin, Total: 3 g/dL (ref 1.5–4.5)
Glucose: 275 mg/dL — ABNORMAL HIGH (ref 70–99)
Potassium: 4.2 mmol/L (ref 3.5–5.2)
Sodium: 138 mmol/L (ref 134–144)
Total Protein: 7.6 g/dL (ref 6.0–8.5)
eGFR: 101 mL/min/{1.73_m2} (ref >59)

## 2021-06-01 LAB — LIPID PANEL
Chol/HDL Ratio: 4.5 ratio (ref 0.0–5.0)
Cholesterol, Total: 186 mg/dL (ref 100–199)
HDL: 41 mg/dL (ref >39)
LDL Chol Calc (NIH): 103 mg/dL — ABNORMAL HIGH (ref 0–99)
Triglycerides: 244 mg/dL — ABNORMAL HIGH (ref 0–149)
VLDL Cholesterol Cal: 42 mg/dL — ABNORMAL HIGH (ref 5–40)

## 2021-06-01 LAB — HEMOGLOBIN A1C
Est. average glucose Bld gHb Est-mCnc: 286 mg/dL
Hgb A1c MFr Bld: 11.6 % — ABNORMAL HIGH (ref 4.8–5.6)

## 2021-06-02 ENCOUNTER — Other Ambulatory Visit: Payer: Self-pay

## 2021-06-02 ENCOUNTER — Telehealth: Payer: Self-pay | Admitting: Internal Medicine

## 2021-06-02 DIAGNOSIS — E1169 Type 2 diabetes mellitus with other specified complication: Secondary | ICD-10-CM

## 2021-06-02 MED ORDER — BASAGLAR KWIKPEN 100 UNIT/ML ~~LOC~~ SOPN
28.0000 [IU] | PEN_INJECTOR | Freq: Every day | SUBCUTANEOUS | 4 refills | Status: DC
  Filled 2021-06-02: qty 15, 53d supply, fill #0
  Filled 2021-06-25: qty 9, 30d supply, fill #0
  Filled 2021-08-06: qty 9, 30d supply, fill #1
  Filled 2021-09-17: qty 9, 30d supply, fill #2
  Filled 2021-10-18: qty 9, 30d supply, fill #3
  Filled 2021-11-19: qty 9, 30d supply, fill #4
  Filled 2021-12-15: qty 9, 30d supply, fill #5
  Filled 2022-01-14: qty 9, 30d supply, fill #6
  Filled 2022-02-16: qty 9, 30d supply, fill #7

## 2021-06-02 MED ORDER — ATORVASTATIN CALCIUM 40 MG PO TABS: 60.0000 mg | ORAL_TABLET | Freq: Every day | ORAL | 6 refills | Status: DC

## 2021-06-02 NOTE — Telephone Encounter (Signed)
done

## 2021-06-02 NOTE — Telephone Encounter (Signed)
Phone call placed to patient this morning to go over lab results.  His mailbox is full and unable to accept messages at this time.  Patient does not check MyChart messages consistently.  I will send him a lab letter. ? ?Lab letter content shown below: ?Dear Mr. William Martin: ? ?I am writing to give you the results of your recent lab tests.  I tried calling you but your voicemail box was full and I was unable to leave a message.  You have a small amount of protein in the urine.  This is likely due to uncontrolled diabetes. ?Your A1c was 11.6 with goal being less than 7.  On our last telephone visit, you confirmed taking your insulin, Trulicity and metformin consistently.  If this is the case, I recommend increasing the Lantus insulin from 22 units daily to 28 units daily.  Please check your blood sugars at least twice a day before meals.  I will have them schedule an appointment for you to see our clinical pharmacist in several weeks.  Please bring your blood sugar readings in with you to that visit. ? ?Your cholesterol level was elevated at 103 with goal being less than 70.  If you have been taking the atorvastatin 40 mg daily consistently, I recommend increasing the dose to 1-1/2 tablets a day which means that you will be on 60 mg daily. ? ?Your kidney and liver function tests were normal.  Blood cell counts normal. ? ?If you have any questions or concerns, please don't hesitate to call. ? ? ?Results for orders placed or performed in visit on 05/31/21  ?Microalbumin / creatinine urine ratio  ?Result Value Ref Range  ? Creatinine, Urine 80.3 Not Estab. mg/dL  ? Microalbumin, Urine 28.4 Not Estab. ug/mL  ? Microalb/Creat Ratio 35 (H) 0 - 29 mg/g creat  ?Hemoglobin A1c  ?Result Value Ref Range  ? Hgb A1c MFr Bld 11.6 (H) 4.8 - 5.6 %  ? Est. average glucose Bld gHb Est-mCnc 286 mg/dL  ?Lipid panel  ?Result Value Ref Range  ? Cholesterol, Total 186 100 - 199 mg/dL  ? Triglycerides 244 (H) 0 - 149 mg/dL  ? HDL 41 >39 mg/dL  ?  VLDL Cholesterol Cal 42 (H) 5 - 40 mg/dL  ? LDL Chol Calc (NIH) 103 (H) 0 - 99 mg/dL  ? Chol/HDL Ratio 4.5 0.0 - 5.0 ratio  ?Comprehensive metabolic panel  ?Result Value Ref Range  ? Glucose 275 (H) 70 - 99 mg/dL  ? BUN 12 6 - 24 mg/dL  ? Creatinine, Ser 0.97 0.76 - 1.27 mg/dL  ? eGFR 101 >59 mL/min/1.73  ? BUN/Creatinine Ratio 12 9 - 20  ? Sodium 138 134 - 144 mmol/L  ? Potassium 4.2 3.5 - 5.2 mmol/L  ? Chloride 99 96 - 106 mmol/L  ? CO2 26 20 - 29 mmol/L  ? Calcium 9.7 8.7 - 10.2 mg/dL  ? Total Protein 7.6 6.0 - 8.5 g/dL  ? Albumin 4.6 4.0 - 5.0 g/dL  ? Globulin, Total 3.0 1.5 - 4.5 g/dL  ? Albumin/Globulin Ratio 1.5 1.2 - 2.2  ? Bilirubin Total 0.7 0.0 - 1.2 mg/dL  ? Alkaline Phosphatase 117 44 - 121 IU/L  ? AST 32 0 - 40 IU/L  ? ALT 43 0 - 44 IU/L  ?CBC  ?Result Value Ref Range  ? WBC 9.3 3.4 - 10.8 x10E3/uL  ? RBC 5.48 4.14 - 5.80 x10E6/uL  ? Hemoglobin 14.3 13.0 - 17.7 g/dL  ? Hematocrit  43.6 37.5 - 51.0 %  ? MCV 80 79 - 97 fL  ? MCH 26.1 (L) 26.6 - 33.0 pg  ? MCHC 32.8 31.5 - 35.7 g/dL  ? RDW 15.8 (H) 11.6 - 15.4 %  ? Platelets 286 150 - 450 x10E3/uL  ? ? ?

## 2021-06-25 ENCOUNTER — Other Ambulatory Visit: Payer: Self-pay

## 2021-06-25 NOTE — Progress Notes (Unsigned)
? ? ?S:    ?William Martin is a 41 y.o. male who presents for diabetes evaluation, education, and management. PMH is significant for T2DM, HTN, HLD, obesity. Patient was referred and last seen by Primary Care Provider, Dr. Wynetta Emery, on 05/24/21 via telephone visit. Labs drawn after this visit showed A1c 11.6. Patient reported compliance during the visit with Dr. Wynetta Emery. She increased his Basaglar insulin. Atorvastatin was also increased due to elevated LDL.  ? ?Today, patient arrives in *** spirits and presents {w-w/o:315700} assistance. *** ?***increased insulin? Increased atorva? Just switch to 80 mg? Hard to split in half? ?Two amlodipine doses on file?  ?Increase trulicity?  ? ?Patient reports Diabetes was diagnosed in ***.  ? ?Family/Social History:  ?-T2DM in mother ?-Current smoker ? ?Current diabetes medications include: Basaglar 28 units daily, Trulicity 1.5 mg weekly, metformin 1000 mg BID ?Current hypertension medications include: lisinopril 10 mg daily, amlodipine *** mg daily ?Current hyperlipidemia medications include: atorvastatin 60 mg daily ? ?Patient reports taking all medications as prescribed. Patient {Actions; denies-reports:120008} adherence with medications. Patient reports missing {his/her/their:21314} medications *** times per week, on average. ? ?Do you feel that your medications are working for you? {YES NO:22349} ?Have you been experiencing any side effects to the medications prescribed? {YES NO:22349} ?Do you have any problems obtaining medications due to transportation or finances? {YES NO:22349} ?Insurance coverage: *** ? ?Patient {Actions; denies-reports:120008} hypoglycemic events. ? ?Reported home fasting blood sugars: ***  ?Reported 2 hour post-meal/random blood sugars: ***. ? ?Patient {Actions; denies-reports:120008} nocturia (nighttime urination).  ?Patient {Actions; denies-reports:120008} neuropathy (nerve pain). ?Patient {Actions; denies-reports:120008} visual changes. ?Patient  {Actions; denies-reports:120008} self foot exams.  ? ?Patient reported dietary habits: Eats *** meals/day ?Breakfast: *** ?Lunch: *** ?Dinner: *** ?Snacks: *** ?Drinks: *** ? ?Within the past 12 months, did you worry whether your food would run out before you got money to buy more? {YES NO:22349} ?Within the past 12 months, did the food you bought run out, and you didn?t have money to get more? {YES NO:22349} ? ?Patient-reported exercise habits: *** ? ? ?O:  ?7 day average blood glucose: *** ? ?Lab Results  ?Component Value Date  ? HGBA1C 11.6 (H) 05/31/2021  ? ?There were no vitals filed for this visit. ? ?Lipid Panel  ?   ?Component Value Date/Time  ? CHOL 186 05/31/2021 0912  ? TRIG 244 (H) 05/31/2021 0912  ? HDL 41 05/31/2021 0912  ? CHOLHDL 4.5 05/31/2021 0912  ? CHOLHDL 9.7 03/12/2016 0557  ? VLDL UNABLE TO CALCULATE IF TRIGLYCERIDE OVER 400 mg/dL 03/12/2016 0557  ? [place] 103 (H) 05/31/2021 0912  ? ? ?Clinical Atherosclerotic Cardiovascular Disease (ASCVD): {YES/NO:21197} ?The 10-year ASCVD risk score (Arnett DK, et al., 2019) is: 19.5% ?  Values used to calculate the score: ?    Age: 55 years ?    Sex: Male ?    Is Non-Hispanic African American: Yes ?    Diabetic: Yes ?    Tobacco smoker: Yes ?    Systolic Blood Pressure: [id number] mmHg ?    Is BP treated: Yes ?    HDL Cholesterol: 41 mg/dL ?    Total Cholesterol: 186 mg/dL  ? ? ?A/P: ?Diabetes longstanding*** currently ***. Patient is *** able to verbalize appropriate hypoglycemia management plan. Medication adherence appears ***. Control is suboptimal due to ***. ?-*** ?-Patient educated on purpose, proper use, and potential adverse effects of ***.  ?-Extensively discussed pathophysiology of diabetes, recommended lifestyle interventions, dietary effects on blood sugar control.  ?-  Counseled on s/sx of and management of hypoglycemia.  ?-Next A1c anticipated ***.  ? ?ASCVD risk - primary***secondary prevention in patient with diabetes. Last LDL is*** not at goal  of <*** mg/dL. ASCVD risk factors include *** and 10-year ASCVD risk score of ***. {Desc; low/moderate/high:110033} intensity statin indicated.  ?-{Meds adjust:18428} ***statin *** mg.  ? ?Hypertension longstanding*** currently ***. Blood pressure goal of <*** mmHg. Medication adherence ***. Blood pressure control is suboptimal due to ***. ?-*** ? ?Written patient instructions provided. Patient verbalized understanding of treatment plan. Total time in face to face counseling *** minutes.   ? ?Follow up pharmacist***PCP clinic visit in ***.  ? ?

## 2021-06-28 ENCOUNTER — Ambulatory Visit: Payer: PRIVATE HEALTH INSURANCE | Admitting: Pharmacist

## 2021-08-06 ENCOUNTER — Other Ambulatory Visit: Payer: Self-pay

## 2021-09-17 ENCOUNTER — Other Ambulatory Visit: Payer: Self-pay

## 2021-09-22 ENCOUNTER — Other Ambulatory Visit: Payer: Self-pay | Admitting: Pharmacist

## 2021-09-22 DIAGNOSIS — E1159 Type 2 diabetes mellitus with other circulatory complications: Secondary | ICD-10-CM

## 2021-09-22 MED ORDER — LISINOPRIL 10 MG PO TABS: 10.0000 mg | ORAL_TABLET | Freq: Every day | ORAL | 0 refills | Status: DC

## 2021-09-30 ENCOUNTER — Other Ambulatory Visit: Payer: Self-pay | Admitting: Internal Medicine

## 2021-09-30 DIAGNOSIS — E1169 Type 2 diabetes mellitus with other specified complication: Secondary | ICD-10-CM

## 2021-09-30 NOTE — Telephone Encounter (Signed)
Medication Refill - Medication: Metformin  Has the patient contacted their pharmacy? Yes.  He said he called the pharmacy ((Agent: If yes, when and what did the pharmacy advise?)  Preferred Pharmacy (with phone number or street name): [facility]  [address] [place] Has the patient been seen for an appointment in the last year OR does the patient have an upcoming appointment? Yes.    Agent: Please be advised that RX refills may take up to 3 business days. We ask that you follow-up with your pharmacy.

## 2021-10-01 MED ORDER — METFORMIN HCL 1000 MG PO TABS: 1000.0000 mg | ORAL_TABLET | Freq: Two times a day (BID) | ORAL | 0 refills | Status: DC

## 2021-10-01 NOTE — Telephone Encounter (Signed)
Patient will need to schedule a office visit for further refills. Requested Prescriptions  Pending Prescriptions Disp Refills  . metFORMIN (GLUCOPHAGE) 1000 MG tablet 180 tablet 0    Sig: Take 1 tablet (1,000 mg total) by mouth 2 (two) times daily.     Endocrinology:  Diabetes - Biguanides Failed - 09/30/2021  4:04 PM      Failed - HBA1C is between 0 and 7.9 and within 180 days    HbA1c, POC (controlled diabetic range)  Date Value Ref Range Status  01/18/2021 10.9 (A) 0.0 - 7.0 % Final   Hgb A1c MFr Bld  Date Value Ref Range Status  05/31/2021 11.6 (H) 4.8 - 5.6 % Final    Comment:             Prediabetes: 5.7 - 6.4          Diabetes: >6.4          Glycemic control for adults with diabetes: <7.0          Failed - B12 Level in normal range and within 720 days    No results found for: "VITAMINB12"       Failed - Valid encounter within last 6 months    Recent Outpatient Visits          4 months ago Diabetes mellitus type 2 in obese [facility])   [place] Karle Plumber B, MD   8 months ago Diabetes mellitus type 2 in obese [facility])   [place], Deborah B, MD   1 year ago Diabetes mellitus type 2 in obese [facility])   [place] Ladell Pier, MD   1 year ago Diabetes mellitus type 2 in obese [facility])   [place], Stephen L, RPH-CPP   1 year ago Diabetes mellitus type 2 in obese [facility])   [place], Annie Main L, RPH-CPP             Failed - CBC within normal limits and completed in the last 12 months    WBC  Date Value Ref Range Status  05/31/2021 9.3 3.4 - 10.8 x10E3/uL Final  02/05/2019 7.8 4.0 - 10.5 K/uL Final   RBC  Date Value Ref Range Status  05/31/2021 5.48 4.14 - 5.80 x10E6/uL Final  02/05/2019 4.89 4.22 - 5.81 MIL/uL Final   Hemoglobin  Date Value Ref Range Status  05/31/2021 14.3  13.0 - 17.7 g/dL Final   Hematocrit  Date Value Ref Range Status  05/31/2021 43.6 37.5 - 51.0 % Final   MCHC  Date Value Ref Range Status  05/31/2021 32.8 31.5 - 35.7 g/dL Final  02/05/2019 33.5 30.0 - 36.0 g/dL Final   [facility]  Date Value Ref Range Status  05/31/2021 26.1 (L) 26.6 - 33.0 pg Final  02/05/2019 28.0 26.0 - 34.0 pg Final   MCV  Date Value Ref Range Status  05/31/2021 80 79 - 97 fL Final   No results found for: "PLTCOUNTKUC", "LABPLAT", "POCPLA" RDW  Date Value Ref Range Status  05/31/2021 15.8 (H) 11.6 - 15.4 % Final         Passed - Cr in normal range and within 360 days    Creat  Date Value Ref Range Status  03/17/2016 0.89 0.60 - 1.35 mg/dL Final   Creatinine, Ser  Date Value Ref Range Status  05/31/2021 0.97 0.76 - 1.27  mg/dL Final   Creatinine, Urine  Date Value Ref Range Status  03/11/2016 21.75 mg/dL Final    Comment:    Performed at [place] - eGFR in normal range and within 360 days    GFR calc Af Amer  Date Value Ref Range Status  03/02/2020 115 >59 mL/min/1.73 Final    Comment:    **In accordance with recommendations from the NKF-ASN Task force,**   [facility] is in the process of updating its eGFR calculation to the   2021 CKD-EPI creatinine equation that estimates kidney function   without a race variable.    GFR calc non Af Amer  Date Value Ref Range Status  03/02/2020 99 >59 mL/min/1.73 Final   eGFR  Date Value Ref Range Status  05/31/2021 101 >59 mL/min/1.73 Final

## 2021-10-18 ENCOUNTER — Other Ambulatory Visit: Payer: Self-pay | Admitting: Internal Medicine

## 2021-10-18 ENCOUNTER — Other Ambulatory Visit: Payer: Self-pay

## 2021-10-18 DIAGNOSIS — E1169 Type 2 diabetes mellitus with other specified complication: Secondary | ICD-10-CM

## 2021-10-18 MED ORDER — TRULICITY 1.5 MG/0.5ML ~~LOC~~ SOAJ
1.5000 mg | SUBCUTANEOUS | 0 refills | Status: DC
  Filled 2021-10-18 – 2021-11-19 (×2): qty 2, 28d supply, fill #0

## 2021-10-19 ENCOUNTER — Other Ambulatory Visit: Payer: Self-pay

## 2021-10-26 ENCOUNTER — Other Ambulatory Visit: Payer: Self-pay

## 2021-10-29 ENCOUNTER — Other Ambulatory Visit: Payer: Self-pay | Admitting: Internal Medicine

## 2021-10-29 DIAGNOSIS — E1159 Type 2 diabetes mellitus with other circulatory complications: Secondary | ICD-10-CM

## 2021-10-29 NOTE — Telephone Encounter (Signed)
Called pt - unable to LMOM - mailbox is full. 

## 2021-10-29 NOTE — Telephone Encounter (Signed)
Requested medications are due for refill today.  yes  Requested medications are on the active medications list.  yes  Last refill. 09/22/2021 #30 0 rf  Future visit scheduled.   no  Notes to clinic.  Last refill note stated it was a courtesy refill.    Requested Prescriptions  Pending Prescriptions Disp Refills   lisinopril (ZESTRIL) 10 MG tablet [Pharmacy Med Name: LISINOPRIL 10 MG TABLET] 30 tablet 0    Sig: TAKE 1 TABLET BY MOUTH EVERY DAY     Cardiovascular:  ACE Inhibitors Failed - 10/29/2021 12:06 AM      Failed - Last BP in normal range    BP Readings from Last 1 Encounters:  01/18/21 136/90         Failed - Valid encounter within last 6 months    Recent Outpatient Visits           5 months ago Diabetes mellitus type 2 in obese [facility])   [place] [facility] Jonah Blue B, MD   9 months ago Diabetes mellitus type 2 in obese [facility])   [place] [facility] Marcine Matar, MD   1 year ago Diabetes mellitus type 2 in obese [facility])   [place] [facility] Jonah Blue B, MD   1 year ago Diabetes mellitus type 2 in obese [facility])   [place] [facility] [place], Cornelius Moras, RPH-CPP   1 year ago Diabetes mellitus type 2 in obese [facility])   [place] [facility] [place], Cornelius Moras, RPH-CPP              Passed - Cr in normal range and within 180 days    Creat  Date Value Ref Range Status  03/17/2016 0.89 0.60 - 1.35 mg/dL Final   Creatinine, Ser  Date Value Ref Range Status  05/31/2021 0.97 0.76 - 1.27 mg/dL Final   Creatinine, Urine  Date Value Ref Range Status  03/11/2016 21.75 mg/dL Final    Comment:    Performed at [facility]         Passed - K in normal range and within 180 days    Potassium  Date Value Ref Range Status  05/31/2021 4.2 3.5 - 5.2 mmol/L Final         Passed - Patient is not pregnant

## 2021-11-19 ENCOUNTER — Other Ambulatory Visit: Payer: Self-pay

## 2021-12-02 ENCOUNTER — Other Ambulatory Visit: Payer: Self-pay | Admitting: Internal Medicine

## 2021-12-02 DIAGNOSIS — I152 Hypertension secondary to endocrine disorders: Secondary | ICD-10-CM

## 2021-12-15 ENCOUNTER — Other Ambulatory Visit: Payer: Self-pay

## 2021-12-15 ENCOUNTER — Other Ambulatory Visit: Payer: Self-pay | Admitting: Internal Medicine

## 2021-12-15 DIAGNOSIS — E1169 Type 2 diabetes mellitus with other specified complication: Secondary | ICD-10-CM

## 2021-12-21 ENCOUNTER — Other Ambulatory Visit: Payer: Self-pay | Admitting: Internal Medicine

## 2021-12-21 DIAGNOSIS — I152 Hypertension secondary to endocrine disorders: Secondary | ICD-10-CM

## 2021-12-21 DIAGNOSIS — E669 Obesity, unspecified: Secondary | ICD-10-CM

## 2021-12-21 NOTE — Telephone Encounter (Signed)
Requested medication (s) are due for refill today: yes  Requested medication (s) are on the active medication list: yes  Last refill:  lisinopril 09/22/21 #30/0, metformin 10/01/21 #180/0  Future visit scheduled: yes 01/26/22   Notes to clinic:  Unable to refill per protocol due to failed labs, no updated results.      Requested Prescriptions  Pending Prescriptions Disp Refills   lisinopril (ZESTRIL) 10 MG tablet [Pharmacy Med Name: LISINOPRIL 10 MG TABLET] 30 tablet 0    Sig: TAKE 1 TABLET BY MOUTH EVERY DAY     Cardiovascular:  ACE Inhibitors Failed - 12/21/2021  7:08 AM      Failed - Cr in normal range and within 180 days    Creat  Date Value Ref Range Status  03/17/2016 0.89 0.60 - 1.35 mg/dL Final   Creatinine, Ser  Date Value Ref Range Status  05/31/2021 0.97 0.76 - 1.27 mg/dL Final   Creatinine, Urine  Date Value Ref Range Status  03/11/2016 21.75 mg/dL Final    Comment:    Performed at [facility]         Failed - K in normal range and within 180 days    Potassium  Date Value Ref Range Status  05/31/2021 4.2 3.5 - 5.2 mmol/L Final         Failed - Last BP in normal range    BP Readings from Last 1 Encounters:  01/18/21 136/90         Failed - Valid encounter within last 6 months    Recent Outpatient Visits           7 months ago Diabetes mellitus type 2 in obese [facility])   [place] Karle Plumber B, MD   11 months ago Diabetes mellitus type 2 in obese [facility])   [place], Deborah B, MD   1 year ago Diabetes mellitus type 2 in obese [facility])   [place], Deborah B, MD   1 year ago Diabetes mellitus type 2 in obese [facility])   [place], Jarome Matin, RPH-CPP   1 year ago Diabetes mellitus type 2 in obese [facility])   [place], RPH-CPP       Future  Appointments             In 1 month [place], Dionne Bucy, PA-C [place] - Patient is not pregnant       metFORMIN (GLUCOPHAGE) 1000 MG tablet [Pharmacy Med Name: METFORMIN HCL 1,000 MG TABLET] 180 tablet 0    Sig: TAKE 1 TABLET BY MOUTH TWICE A DAY     Endocrinology:  Diabetes - Biguanides Failed - 12/21/2021  7:08 AM      Failed - HBA1C is between 0 and 7.9 and within 180 days    HbA1c, POC (controlled diabetic range)  Date Value Ref Range Status  01/18/2021 10.9 (A) 0.0 - 7.0 % Final   Hgb A1c MFr Bld  Date Value Ref Range Status  05/31/2021 11.6 (H) 4.8 - 5.6 % Final    Comment:             Prediabetes: 5.7 - 6.4          Diabetes: >6.4  Glycemic control for adults with diabetes: <7.0          Failed - B12 Level in normal range and within 720 days    No results found for: "VITAMINB12"       Failed - Valid encounter within last 6 months    Recent Outpatient Visits           7 months ago Diabetes mellitus type 2 in obese [facility])   [place] Ladell Pier, MD   11 months ago Diabetes mellitus type 2 in obese [facility])   [place] Ladell Pier, MD   1 year ago Diabetes mellitus type 2 in obese [facility])   [place], Deborah B, MD   1 year ago Diabetes mellitus type 2 in obese [facility])   [place], Stephen L, RPH-CPP   1 year ago Diabetes mellitus type 2 in obese [facility])   [place], RPH-CPP       Future Appointments             In 1 month [place], Dionne Bucy, PA-C [place]            Failed - CBC within normal limits and completed in the last 12 months    WBC  Date Value Ref Range Status  05/31/2021 9.3 3.4 - 10.8 x10E3/uL Final  02/05/2019 7.8 4.0 - 10.5 K/uL Final   RBC   Date Value Ref Range Status  05/31/2021 5.48 4.14 - 5.80 x10E6/uL Final  02/05/2019 4.89 4.22 - 5.81 MIL/uL Final   Hemoglobin  Date Value Ref Range Status  05/31/2021 14.3 13.0 - 17.7 g/dL Final   Hematocrit  Date Value Ref Range Status  05/31/2021 43.6 37.5 - 51.0 % Final   MCHC  Date Value Ref Range Status  05/31/2021 32.8 31.5 - 35.7 g/dL Final  02/05/2019 33.5 30.0 - 36.0 g/dL Final   [facility]  Date Value Ref Range Status  05/31/2021 26.1 (L) 26.6 - 33.0 pg Final  02/05/2019 28.0 26.0 - 34.0 pg Final   MCV  Date Value Ref Range Status  05/31/2021 80 79 - 97 fL Final   No results found for: "PLTCOUNTKUC", "LABPLAT", "POCPLA" RDW  Date Value Ref Range Status  05/31/2021 15.8 (H) 11.6 - 15.4 % Final         Passed - Cr in normal range and within 360 days    Creat  Date Value Ref Range Status  03/17/2016 0.89 0.60 - 1.35 mg/dL Final   Creatinine, Ser  Date Value Ref Range Status  05/31/2021 0.97 0.76 - 1.27 mg/dL Final   Creatinine, Urine  Date Value Ref Range Status  03/11/2016 21.75 mg/dL Final    Comment:    Performed at [place] - eGFR in normal range and within 360 days    GFR calc Af Amer  Date Value Ref Range Status  03/02/2020 115 >59 mL/min/1.73 Final    Comment:    **In accordance with recommendations from the NKF-ASN Task force,**   [facility] is in the process of updating its eGFR calculation to the   2021 CKD-EPI creatinine equation that estimates kidney function   without a race variable.    GFR calc non Af Amer  Date Value Ref Range Status  03/02/2020 99 >59 mL/min/1.73 Final   eGFR  Date Value Ref Range Status  05/31/2021 101 >59 mL/min/1.73 Final

## 2021-12-23 ENCOUNTER — Other Ambulatory Visit: Payer: Self-pay | Admitting: Pharmacist

## 2021-12-23 ENCOUNTER — Other Ambulatory Visit: Payer: Self-pay

## 2021-12-23 DIAGNOSIS — E1159 Type 2 diabetes mellitus with other circulatory complications: Secondary | ICD-10-CM

## 2021-12-23 DIAGNOSIS — E669 Obesity, unspecified: Secondary | ICD-10-CM

## 2021-12-23 DIAGNOSIS — E1169 Type 2 diabetes mellitus with other specified complication: Secondary | ICD-10-CM

## 2021-12-23 MED ORDER — METFORMIN HCL 1000 MG PO TABS
1000.0000 mg | ORAL_TABLET | Freq: Two times a day (BID) | ORAL | 0 refills | Status: DC
  Filled 2021-12-23: qty 60, 30d supply, fill #0
  Filled 2021-12-23: qty 180, 90d supply, fill #0

## 2021-12-23 MED ORDER — LISINOPRIL 10 MG PO TABS
10.0000 mg | ORAL_TABLET | Freq: Every day | ORAL | 0 refills | Status: DC
  Filled 2021-12-23 (×2): qty 30, 30d supply, fill #0

## 2021-12-23 MED ORDER — ATORVASTATIN CALCIUM 40 MG PO TABS
60.0000 mg | ORAL_TABLET | Freq: Every day | ORAL | 6 refills | Status: DC
  Filled 2021-12-23 (×2): qty 45, 30d supply, fill #0

## 2021-12-24 ENCOUNTER — Other Ambulatory Visit: Payer: Self-pay

## 2021-12-29 ENCOUNTER — Other Ambulatory Visit: Payer: Self-pay | Admitting: Internal Medicine

## 2021-12-29 ENCOUNTER — Other Ambulatory Visit: Payer: Self-pay

## 2021-12-29 DIAGNOSIS — E1169 Type 2 diabetes mellitus with other specified complication: Secondary | ICD-10-CM

## 2021-12-29 MED ORDER — TRULICITY 1.5 MG/0.5ML ~~LOC~~ SOAJ
1.5000 mg | SUBCUTANEOUS | 0 refills | Status: DC
  Filled 2021-12-29: qty 2, 28d supply, fill #0

## 2022-01-14 ENCOUNTER — Other Ambulatory Visit: Payer: Self-pay

## 2022-01-14 ENCOUNTER — Other Ambulatory Visit: Payer: Self-pay | Admitting: Internal Medicine

## 2022-01-14 DIAGNOSIS — E1169 Type 2 diabetes mellitus with other specified complication: Secondary | ICD-10-CM

## 2022-01-17 ENCOUNTER — Other Ambulatory Visit: Payer: Self-pay

## 2022-01-18 ENCOUNTER — Other Ambulatory Visit: Payer: Self-pay

## 2022-01-26 ENCOUNTER — Ambulatory Visit: Payer: Self-pay | Admitting: Physician Assistant

## 2022-02-16 ENCOUNTER — Other Ambulatory Visit: Payer: Self-pay

## 2022-02-16 ENCOUNTER — Other Ambulatory Visit: Payer: Self-pay | Admitting: Internal Medicine

## 2022-02-16 DIAGNOSIS — E1169 Type 2 diabetes mellitus with other specified complication: Secondary | ICD-10-CM

## 2022-02-16 MED ORDER — TRULICITY 1.5 MG/0.5ML ~~LOC~~ SOAJ
1.5000 mg | SUBCUTANEOUS | 0 refills | Status: DC
  Filled 2022-02-16: qty 2, 28d supply, fill #0

## 2022-02-24 ENCOUNTER — Other Ambulatory Visit: Payer: Self-pay | Admitting: Pharmacist

## 2022-02-24 ENCOUNTER — Ambulatory Visit: Payer: Self-pay | Attending: Physician Assistant | Admitting: Physician Assistant

## 2022-02-24 ENCOUNTER — Other Ambulatory Visit: Payer: Self-pay

## 2022-02-24 ENCOUNTER — Encounter: Payer: Self-pay | Admitting: Physician Assistant

## 2022-02-24 VITALS — BP 148/98 | HR 88 | Ht 70.0 in | Wt 274.4 lb

## 2022-02-24 DIAGNOSIS — Z91199 Patient's noncompliance with other medical treatment and regimen due to unspecified reason: Secondary | ICD-10-CM

## 2022-02-24 DIAGNOSIS — E669 Obesity, unspecified: Secondary | ICD-10-CM

## 2022-02-24 DIAGNOSIS — E1169 Type 2 diabetes mellitus with other specified complication: Secondary | ICD-10-CM

## 2022-02-24 DIAGNOSIS — I152 Hypertension secondary to endocrine disorders: Secondary | ICD-10-CM

## 2022-02-24 DIAGNOSIS — Z23 Encounter for immunization: Secondary | ICD-10-CM

## 2022-02-24 DIAGNOSIS — E785 Hyperlipidemia, unspecified: Secondary | ICD-10-CM

## 2022-02-24 DIAGNOSIS — E1159 Type 2 diabetes mellitus with other circulatory complications: Secondary | ICD-10-CM

## 2022-02-24 LAB — POCT GLYCOSYLATED HEMOGLOBIN (HGB A1C): HbA1c, POC (controlled diabetic range): 10.3 % — AB (ref 0.0–7.0)

## 2022-02-24 LAB — GLUCOSE, POCT (MANUAL RESULT ENTRY): POC Glucose: 198 mg/dl — AB (ref 70–99)

## 2022-02-24 MED ORDER — METFORMIN HCL 1000 MG PO TABS: 1000.0000 mg | ORAL_TABLET | Freq: Two times a day (BID) | ORAL | 0 refills | Status: DC

## 2022-02-24 MED ORDER — "INSULIN SYRINGE-NEEDLE U-100 30G X 1/2"" 1 ML MISC"
1.0000 | Freq: Two times a day (BID) | 3 refills | Status: DC
  Filled 2022-02-24: qty 100, 30d supply, fill #0

## 2022-02-24 MED ORDER — BASAGLAR KWIKPEN 100 UNIT/ML ~~LOC~~ SOPN
30.0000 [IU] | PEN_INJECTOR | Freq: Every day | SUBCUTANEOUS | 4 refills | Status: DC
  Filled 2022-02-24: qty 15, 50d supply, fill #0
  Filled 2022-03-18: qty 9, 30d supply, fill #0
  Filled 2022-04-21: qty 9, 30d supply, fill #1

## 2022-02-24 MED ORDER — LISINOPRIL 10 MG PO TABS: 10.0000 mg | ORAL_TABLET | Freq: Every day | ORAL | 0 refills | Status: DC

## 2022-02-24 MED ORDER — INSULIN PEN NEEDLE 31G X 8 MM MISC
6 refills | Status: DC
  Filled 2022-02-24: qty 100, 25d supply, fill #0

## 2022-02-24 MED ORDER — AMLODIPINE BESYLATE 10 MG PO TABS: ORAL_TABLET | Freq: Every day | ORAL | 6 refills | Status: DC

## 2022-02-24 MED ORDER — INSULIN PEN NEEDLE 31G X 8 MM MISC: 6 refills | Status: DC

## 2022-02-24 MED ORDER — ATORVASTATIN CALCIUM 40 MG PO TABS: 60.0000 mg | ORAL_TABLET | Freq: Every day | ORAL | 6 refills | Status: DC

## 2022-02-24 MED ORDER — TRULICITY 1.5 MG/0.5ML ~~LOC~~ SOAJ
1.5000 mg | SUBCUTANEOUS | 3 refills | Status: DC
  Filled 2022-02-24 – 2022-03-18 (×2): qty 2, 28d supply, fill #0
  Filled 2022-04-21 – 2022-04-28 (×2): qty 2, 28d supply, fill #1

## 2022-02-24 NOTE — Patient Instructions (Signed)
Check blood sugars fasting and bedtime and record and bring to next visit.  I have increased your basaglar to 30 units daily.  Also work to eliminate sugar and starchy foods from your diet.

## 2022-02-24 NOTE — Progress Notes (Signed)
Patient ID: William Martin, male   DOB: [birth year], 41 y.o.   MRN: [id number]   Eriverto Byrnes, is a 41 y.o. male  [id number]  [id number]  DOB - 1981-03-04  Chief Complaint  Patient presents with   Diabetes   Medication Refill       Subjective:   William Martin is a 41 y.o. male here today for med RF.  He can't really tell me what he is taking as far as his pills go.  He says he takes trulicity on Wednesdays but didn't take yesterday. Says he takes basaglar 28 units daily and doesn't miss doses but then has not had it filled in a while.  Many answers to questions are contradicting.  He says he is taking all meds but then says he is "out of some of them."  He does not check blood sugars.  He does not check blood pressures.  Eats a fair amount of sugar.  Denies any s/s.  [facility]  No problems updated.  ALLERGIES: Allergies  Allergen Reactions   Bee Venom Swelling    PAST MEDICAL HISTORY: Past Medical History:  Diagnosis Date   Diabetes mellitus without complication (HCC)    GERD (gastroesophageal reflux disease)    Tobacco abuse     MEDICATIONS AT HOME: Prior to Admission medications   Medication Sig Start Date End Date Taking? Authorizing Provider  amLODipine (NORVASC) 10 MG tablet TAKE 1 TABLET (10 MG TOTAL) BY MOUTH DAILY. 02/24/22 02/24/23  Anders Simmonds, PA-C  Aspirin-Salicylamide-Caffeine (BC HEADACHE POWDER PO) Take 1 packet by mouth daily as needed (headache).    [provider]  atorvastatin (LIPITOR) 40 MG tablet Take 1.5 tablets (60 mg total) by mouth daily. 02/24/22   Anders Simmonds, PA-C  Dulaglutide (TRULICITY) 1.5 MG/0.5ML SOPN INJECT 1.5 MG INTO THE SKIN ONCE A WEEK. 02/24/22   Georgian Co M, PA-C  Insulin Glargine (BASAGLAR KWIKPEN) 100 UNIT/ML Inject 30 Units into the skin daily. 02/24/22   Anders Simmonds, PA-C  Insulin Pen Needle 31G X 8 MM MISC Use as directed 02/24/22    Anders Simmonds, PA-C  Insulin Syringe-Needle U-100 (B-D INS SYR ULTRAFINE 1CC/30G) 30G X 1/2" 1 ML MISC USE AS DIRECTED TWICE A DAY 02/24/22   Thurza Kwiecinski, Marylene Land M, PA-C  lisinopril (ZESTRIL) 10 MG tablet Take 1 tablet (10 mg total) by mouth daily. 02/24/22   Anders Simmonds, PA-C  metFORMIN (GLUCOPHAGE) 1000 MG tablet Take 1 tablet (1,000 mg total) by mouth 2 (two) times daily. 02/24/22   Anders Simmonds, PA-C  omeprazole (PRILOSEC) 20 MG capsule TAKE 1 CAPSULE EVERY DAY AS NEEDED FOR HEARTBURN Patient taking differently: Take 20 mg by mouth daily as needed (heartburn). 04/17/20   Marcine Matar, MD    ROS: Neg HEENT Neg resp Neg cardiac Neg GI Neg GU Neg MS Neg psych Neg neuro  Objective:   Vitals:   02/24/22 0959 02/24/22 1011  BP: (!) 162/112 (!) 148/98  Pulse: 88   SpO2: 98%   Weight: 274 lb 6.4 oz (124.5 kg)   Height: 5\' 10"  (1.778 m)    Exam General appearance : Awake, alert, not in any distress. Speech Clear. Not toxic looking; flat affect.  Limited communication style HEENT: Atraumatic and Normocephalic Neck: Supple, no JVD. No cervical lymphadenopathy.  Chest: Good air entry bilaterally, CTAB.  No rales/rhonchi/wheezing CVS: S1 S2 regular, no murmurs.  Extremities: B/L  Lower Ext shows no edema, both legs are warm to touch Neurology: Awake alert, and oriented X 3, CN II-XII intact, Non focal Skin: No Rash  Data Review Lab Results  Component Value Date   HGBA1C 10.3 (A) 02/24/2022   HGBA1C 11.6 (H) 05/31/2021   HGBA1C 10.9 (A) 01/18/2021    Assessment & Plan   1. Diabetes mellitus type 2 in obese (HCC) Uncontrolled-doubt compliance. Encouraged compliance and checking blood sugars so we can better assess what changes need to be made.  I increased his basaglar to 30 today - Glucose (CBG) - metFORMIN (GLUCOPHAGE) 1000 MG tablet; Take 1 tablet (1,000 mg total) by mouth 2 (two) times daily.  Dispense: 180 tablet; Refill: 0 - Insulin Pen Needle 31G X 8 MM  MISC; Use as directed  Dispense: 100 each; Refill: 6 - Comprehensive metabolic panel - POCT glycosylated hemoglobin (Hb A1C) - Insulin Glargine (BASAGLAR KWIKPEN) 100 UNIT/ML; Inject 30 Units into the skin daily.  Dispense: 15 mL; Refill: 4 - Dulaglutide (TRULICITY) 1.5 MG/0.5ML SOPN; INJECT 1.5 MG INTO THE SKIN ONCE A WEEK.  Dispense: 2 mL; Refill: 3 - Insulin Syringe-Needle U-100 (B-D INS SYR ULTRAFINE 1CC/30G) 30G X 1/2" 1 ML MISC; USE AS DIRECTED TWICE A DAY  Dispense: 100 each; Refill: 3  2. Hypertension associated with diabetes (HCC) Not at goal but has been out of lisinopril for a while - lisinopril (ZESTRIL) 10 MG tablet; Take 1 tablet (10 mg total) by mouth daily.  Dispense: 90 tablet; Refill: 0 - amLODipine (NORVASC) 10 MG tablet; TAKE 1 TABLET (10 MG TOTAL) BY MOUTH DAILY.  Dispense: 30 tablet; Refill: 6  3. Hyperlipidemia associated with type 2 diabetes mellitus (HCC) - atorvastatin (LIPITOR) 40 MG tablet; Take 1.5 tablets (60 mg total) by mouth daily.  Dispense: 45 tablet; Refill: 6  4. Need for immunization against influenza - Flu Vaccine MDCK QUAD PF  5. Poor compliance I doubt compliance due to being unable to give straight answers about which medications he is taking and saying he never misses doses.      Return for 4 weeks with Franky Macho; 3 months with PCP for chronic conditions.  The patient was given clear instructions to go to ER or return to medical center if symptoms don't improve, worsen or new problems develop. The patient verbalized understanding. The patient was told to call to get lab results if they haven't heard anything in the next week.      Georgian Co, PA-C [facility] [place], [place] [phone]   02/24/2022, 10:22 AM

## 2022-02-25 LAB — COMPREHENSIVE METABOLIC PANEL
ALT: 28 IU/L (ref 0–44)
AST: 25 IU/L (ref 0–40)
Albumin/Globulin Ratio: 1.5 (ref 1.2–2.2)
Albumin: 4.5 g/dL (ref 4.1–5.1)
Alkaline Phosphatase: 103 IU/L (ref 44–121)
BUN/Creatinine Ratio: 10 (ref 9–20)
BUN: 10 mg/dL (ref 6–24)
Bilirubin Total: 0.8 mg/dL (ref 0.0–1.2)
CO2: 22 mmol/L (ref 20–29)
Calcium: 8.8 mg/dL (ref 8.7–10.2)
Chloride: 103 mmol/L (ref 96–106)
Creatinine, Ser: 1.02 mg/dL (ref 0.76–1.27)
Globulin, Total: 3 g/dL (ref 1.5–4.5)
Glucose: 175 mg/dL — ABNORMAL HIGH (ref 70–99)
Potassium: 4 mmol/L (ref 3.5–5.2)
Sodium: 141 mmol/L (ref 134–144)
Total Protein: 7.5 g/dL (ref 6.0–8.5)
eGFR: 95 mL/min/{1.73_m2} (ref >59)

## 2022-03-18 ENCOUNTER — Other Ambulatory Visit: Payer: Self-pay

## 2022-04-04 ENCOUNTER — Ambulatory Visit: Payer: Self-pay | Admitting: Pharmacist

## 2022-04-18 ENCOUNTER — Other Ambulatory Visit: Payer: Self-pay

## 2022-04-18 ENCOUNTER — Ambulatory Visit: Payer: Self-pay | Attending: Family Medicine | Admitting: Pharmacist

## 2022-04-18 DIAGNOSIS — E1169 Type 2 diabetes mellitus with other specified complication: Secondary | ICD-10-CM

## 2022-04-18 DIAGNOSIS — E669 Obesity, unspecified: Secondary | ICD-10-CM

## 2022-04-18 NOTE — Progress Notes (Signed)
S:     No chief complaint on file.  42 y.o. male who presents for diabetes evaluation, education, and management.  PMH is significant for HTN and HLD.  Patient was referred and last seen by Primary Care Provider, William Martin, on 02/24/2022.  Patient was last seen by Primary Care Provider, William Martin, on 05/24/2021.  At last visit, A1c was 10.3 on 02/24/2022.   Today, patient arrives in good spirits and presents without any assistance.   Patient reports Diabetes was diagnosed in 2017.   Family History: Mother has history of T2DM. Social History: Patient states he smokes 2 cigars/month.  Current diabetes medications include: Trulicity 1.5 mg once weekly, Basaglar 30 units daily, and metformin 1,000 mg twice daily. Current hypertension medications include: amlodipine 10 mg daily and lisinopril 10 mg daily Current hyperlipidemia medications include: atorvastatin 40 mg daily  Patient reports adherence to taking all medications as prescribed.  Patient denies any missed doses during the week.  Do you feel that your medications are working for you? yes Have you been experiencing any side effects to the medications prescribed? Yes. Patient states he has bowel issues every once in a while, but it is not common for him. Do you have any problems obtaining medications due to transportation or finances? No Insurance coverage: Christella Scheuermann  Denies hypoglycemic events.  Patient denies taking at-home blood glucose measurements. States he does have a meter and supplies at home, but does not take measurements. Denies suggestion for a continuous glucose monitor.  Patient denies nocturia (nighttime urination).  Patient denies neuropathy (nerve pain). Patient denies visual changes. Patient reports self foot exams.   Patient reported dietary habits: Endorses restricting fried foods, red meat, sodium, and added sugars. Drinks: States he drinks a regular soda once per month and tries to only drink 0  sugar sodas.  Patient-reported exercise habits: Denies exercise outside of work.   O:   ROS  Physical Exam   Lab Results  Component Value Date   HGBA1C 10.3 (A) 02/24/2022   There were no vitals filed for this visit.  Lipid Panel     Component Value Date/Time   CHOL 186 05/31/2021 0912   TRIG 244 (H) 05/31/2021 0912   HDL 41 05/31/2021 0912   CHOLHDL 4.5 05/31/2021 0912   CHOLHDL 9.7 03/12/2016 0557   VLDL UNABLE TO CALCULATE IF TRIGLYCERIDE OVER 400 mg/dL 03/12/2016 0557   LDLCALC 103 (H) 05/31/2021 0912    Clinical Atherosclerotic Cardiovascular Disease (ASCVD): No  The 10-year ASCVD risk score (Arnett DK, et al., 2019) is: 23.7%   Values used to calculate the score:     Age: 77 years     Sex: Male     Is Non-Hispanic African American: Yes     Diabetic: Yes     Tobacco smoker: Yes     Systolic Blood Pressure: 161 mmHg     Is BP treated: Yes     HDL Cholesterol: 41 mg/dL     Total Cholesterol: 186 mg/dL   Patient is participating in a Managed Medicaid Plan: No   A/P: Diabetes longstanding currently uncontrolled. Patient is able to verbalize appropriate hypoglycemia management plan. Medication adherence appears compliant.  -Continued basal insulin 30 units daily (insulin glargine).  -Continued GLP-1 Trulicity (generic dulaglutide) 1.5 mg once weekly. -Continued metformin 1,000 mg twice daily. -Patient educated on purpose, proper use, and potential adverse effects of Trulicity, Basaglar, and metformin.  -Extensively discussed pathophysiology of diabetes, recommended lifestyle interventions, dietary effects on  blood sugar control.  -Counseled on s/sx of and management of hypoglycemia.  -Next A1c anticipated 05/29/2022.  - Suggested use of CGM since patient has not been able to take at-home readings. Patient denied and stated he would start taking readings with his monitor at home.  Written patient instructions provided. Patient verbalized understanding of  treatment plan.  Total time in face to face counseling 30 minutes.    Follow-up:  Pharmacist in 1 month. PCP clinic visit on 05/30/2022.   Patient seen with:  Lillard Anes [facility] ESOP  PharmD candidate class of 2026.  William Martin, PharmD, Para [place], [place] [phone]

## 2022-04-21 ENCOUNTER — Other Ambulatory Visit: Payer: Self-pay

## 2022-04-21 ENCOUNTER — Other Ambulatory Visit: Payer: Self-pay | Admitting: Physician Assistant

## 2022-04-21 DIAGNOSIS — E1169 Type 2 diabetes mellitus with other specified complication: Secondary | ICD-10-CM

## 2022-04-21 NOTE — Telephone Encounter (Signed)
Requested medication (s) are due for refill today: see below  Requested medication (s) are on the active medication list: yes  Last refill:  02/24/22  Future visit scheduled: yes  Notes to clinic:   Pharmacy comment: insurance will not pay for Basaglar - requesting script for Lantus        Requested Prescriptions  Pending Prescriptions Disp Refills   Insulin Glargine (BASAGLAR KWIKPEN) 100 UNIT/ML 15 mL 4    Sig: Inject 30 Units into the skin daily.     Endocrinology:  Diabetes - Insulins Failed - 04/21/2022  9:03 AM      Failed - HBA1C is between 0 and 7.9 and within 180 days    HbA1c, POC (controlled diabetic range)  Date Value Ref Range Status  02/24/2022 10.3 (A) 0.0 - 7.0 % Final         Passed - Valid encounter within last 6 months    Recent Outpatient Visits           3 days ago Diabetes mellitus type 2 in obese [facility]   [place], [place] L, RPH-CPP   1 month ago Diabetes mellitus type 2 in obese [facility])   [place], [employer], [place]   11 months ago Diabetes mellitus type 2 in obese [facility])   [place] Karle Plumber B, MD   1 year ago Diabetes mellitus type 2 in obese [facility])   [place] Karle Plumber B, MD   1 year ago Diabetes mellitus type 2 in obese [facility])   [place], MD       Future Appointments             In 4 weeks Daisy Bloss[place], Jarome Matin, [place]   In 1 month [place], MD [place]

## 2022-04-22 ENCOUNTER — Other Ambulatory Visit: Payer: Self-pay

## 2022-04-22 ENCOUNTER — Other Ambulatory Visit: Payer: Self-pay | Admitting: Internal Medicine

## 2022-04-22 DIAGNOSIS — E1169 Type 2 diabetes mellitus with other specified complication: Secondary | ICD-10-CM

## 2022-04-22 MED ORDER — LANTUS SOLOSTAR 100 UNIT/ML ~~LOC~~ SOPN
30.0000 [IU] | PEN_INJECTOR | Freq: Every day | SUBCUTANEOUS | 99 refills | Status: DC
  Filled 2022-04-22: qty 15, 50d supply, fill #0

## 2022-04-22 MED ORDER — BASAGLAR KWIKPEN 100 UNIT/ML ~~LOC~~ SOPN
30.0000 [IU] | PEN_INJECTOR | Freq: Every day | SUBCUTANEOUS | 2 refills | Status: DC
  Filled 2022-04-22: qty 9, 30d supply, fill #0

## 2022-04-25 ENCOUNTER — Other Ambulatory Visit: Payer: Self-pay

## 2022-04-28 ENCOUNTER — Other Ambulatory Visit: Payer: Self-pay

## 2022-05-10 ENCOUNTER — Other Ambulatory Visit: Payer: Self-pay

## 2022-05-10 NOTE — Progress Notes (Signed)
Patient outreached by Levi Aland, PharmD Candidate on 05/09/22 to discuss hypertension   Patient does not have an automated home blood pressure machine. Patient is uninterested/unwilling in getting a blood pressure monitor to take readings at home. Advised that most pharmacies offer free BP readings and that the pharmacy here offers free BP readings from 9-4 most days. Pt was uninterested in this.   Medication review was performed. They are taking medications as prescribed. Pt states he is taking "everything as it was prescribed" but notes from [facility] suggest adherence may not be as good as stated.   The following barriers to adherence were noted:  - They do not have cost concerns.  - They do not have transportation concerns.  - They do not need assistance obtaining refills.  - They do not occasionally forget to take some of their prescribed medications.  - They do not feel like one/some of their medications make them feel poorly.  - They do not have questions or concerns about their medications.  - They do have follow up scheduled with their primary care provider/cardiologist.   The following interventions were completed:  - Medications were reviewed  - Patient was educated on goal blood pressures and long term health implications of elevated blood pressure  - Patient was educated on medications, including indication and administration  - Patient was educated on use of adherence strategies, like a pill box or alarms  - Patient was educated on how to access home blood pressure machine   Patient was uninterested and had little motivation throughout the conversation. Tried to engage the patient and have him give more than yes/no answers but pt was unwilling and would only reply with short answers or silence. Tried to educate on HTN but unsure on effectiveness at this time.   The patient has follow up scheduled: 05/30/22  PCP: Baron Sane, PharmD Candidate, Class of [phone]   [place], [place].D. PGY-2 Ambulatory Care Pharmacy Resident 05/10/2022 9:34 AM

## 2022-05-18 NOTE — Progress Notes (Deleted)
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 male who presents for diabetes evaluation, education, and management.  PMH is significant for HTN, GERD, HLD, and obesity.  Patient was referred and last seen by Primary Care Provider, Freeman Caldron on 02/24/2022.   At last visit with [facility], A1c was elevated but improved at 10.3.Insulin dose was increased at that time.   At last visit with the pharmacy team, patient reported adherence, however, did not provide any home BG readings to gauge control. No changes were made at that time.  Today, patient arrives in *** good spirits and presents without *** any assistance. ***  Patient reports Diabetes was diagnosed in 2017.   Family/Social History:  -Fhx: DM -Tobacco: 2 cigars/month -Alcohol: endorses  Current diabetes medications include: Trulicity 1.5 mg once weekly, Basaglar 30 units daily, and metformin 1,000 mg twice daily. Current hypertension medications include: amlodipine 10 mg daily and lisinopril 10 mg daily Current hyperlipidemia medications include: atorvastatin 40 mg daily 1.5 tablets daily   Patient reports adherence to taking all medications as prescribed.  *** Patient denies adherence with medications, reports missing *** medications *** times per week, on average.  Insurance coverage: self-pay  Patient {Actions; denies-reports:120008} hypoglycemic events.  Reported home fasting blood sugars: ***  Reported 2 hour post-meal/random blood sugars: ***.  Patient {Actions; denies-reports:120008} nocturia (nighttime urination).  Patient {Actions; denies-reports:120008} neuropathy (nerve pain). Patient {Actions; denies-reports:120008} visual changes. Patient {Actions; denies-reports:120008} self foot exams.   Patient reported dietary habits: Eats *** meals/day Breakfast: *** Lunch: *** Dinner: *** Snacks: *** Drinks: ***  Patient-reported exercise habits: ***   O:   ROS  Physical Exam  7 day average blood  glucose: ***  *** CGM Download:  % Time CGM is active: ***% Average Glucose: *** mg/dL Glucose Management Indicator: ***  Glucose Variability: *** (goal <36%) Time in Goal:  - Time in range 70-180: ***% - Time above range: ***% - Time below range: ***% Observed patterns:   Lab Results  Component Value Date   HGBA1C 10.3 (A) 02/24/2022   There were no vitals filed for this visit.  Lipid Panel     Component Value Date/Time   CHOL 186 05/31/2021 0912   TRIG 244 (H) 05/31/2021 0912   HDL 41 05/31/2021 0912   CHOLHDL 4.5 05/31/2021 0912   CHOLHDL 9.7 03/12/2016 0557   VLDL UNABLE TO CALCULATE IF TRIGLYCERIDE OVER 400 mg/dL 03/12/2016 0557   LDLCALC 103 (H) 05/31/2021 0912    Clinical Atherosclerotic Cardiovascular Disease (ASCVD): No  The 10-year ASCVD risk score (Arnett DK, et al., 2019) is: 23.7%   Values used to calculate the score:     Age: 26 years     Sex: Male     Is Non-Hispanic African American: Yes     Diabetic: Yes     Tobacco smoker: Yes     Systolic Blood Pressure: 123456 mmHg     Is BP treated: Yes     HDL Cholesterol: 41 mg/dL     Total Cholesterol: 186 mg/dL    A/P: Diabetes longstanding *** currently ***. Patient is *** able to verbalize appropriate hypoglycemia management plan. Medication adherence appears ***. Control is suboptimal due to ***. -{Meds adjust:18428} basal insulin *** Lantus/Basaglar/Semglee (insulin glargine) *** Tresiba (insulin degludec) from *** units to *** units daily in the morning. Patient will continue to titrate 1 unit every *** days if fasting blood sugar > 141m/dl until fasting blood sugars reach goal  or next visit.  -{Meds adjust:18428} rapid insulin *** Novolog (insulin aspart) *** Humalog (insulin lispro) from *** to ***.  -{Meds XX123456 GLP-1 *** Trulicity (dulaglutide) *** Ozempic (semaglutide) *** Mounjaro (tirzepatide) from *** mg to *** mg .  -{Meds adjust:18428} SGLT2-I *** Farxiga (dapagliflozin) *** Jardiance  (empagliflozin) 10 mg. Counseled on sick day rules. -{Meds adjust:18428} metformin ***.  -Patient educated on purpose, proper use, and potential adverse effects of ***.  -Extensively discussed pathophysiology of diabetes, recommended lifestyle interventions, dietary effects on blood sugar control.  -Counseled on s/sx of and management of hypoglycemia.  -Next A1c anticipated ***.   ASCVD risk - primary ***secondary prevention in patient with diabetes. Last LDL is *** not at goal of <70 *** mg/dL. ASCVD risk factors include *** and 10-year ASCVD risk score of ***. {Desc; low/moderate/high:110033} intensity statin indicated.  -{Meds adjust:18428} ***statin *** mg.   Hypertension longstanding *** currently ***. Blood pressure goal of <130/80 *** mmHg. Medication adherence ***. Blood pressure control is suboptimal due to ***. -{Meds adjust:18428} *** mg.  Written patient instructions provided. Patient verbalized understanding of treatment plan.  Total time in face to face counseling *** minutes.    Follow-up:  Pharmacist ***. PCP clinic visit in 05/30/22  Maryan Puls, PharmD PGY-1 [facility] Pharmacy Resident

## 2022-05-19 ENCOUNTER — Ambulatory Visit: Payer: Self-pay | Admitting: Pharmacist

## 2022-05-30 ENCOUNTER — Encounter: Payer: Self-pay | Admitting: Internal Medicine

## 2022-05-30 ENCOUNTER — Other Ambulatory Visit: Payer: Self-pay

## 2022-05-30 ENCOUNTER — Ambulatory Visit: Payer: Self-pay | Attending: Internal Medicine | Admitting: Internal Medicine

## 2022-05-30 VITALS — BP 133/87 | HR 89 | Temp 98.3°F | Ht 70.0 in | Wt 275.0 lb

## 2022-05-30 DIAGNOSIS — E785 Hyperlipidemia, unspecified: Secondary | ICD-10-CM

## 2022-05-30 DIAGNOSIS — E1169 Type 2 diabetes mellitus with other specified complication: Secondary | ICD-10-CM

## 2022-05-30 DIAGNOSIS — F17200 Nicotine dependence, unspecified, uncomplicated: Secondary | ICD-10-CM

## 2022-05-30 DIAGNOSIS — I152 Hypertension secondary to endocrine disorders: Secondary | ICD-10-CM

## 2022-05-30 DIAGNOSIS — E669 Obesity, unspecified: Secondary | ICD-10-CM

## 2022-05-30 DIAGNOSIS — E1159 Type 2 diabetes mellitus with other circulatory complications: Secondary | ICD-10-CM

## 2022-05-30 LAB — GLUCOSE, POCT (MANUAL RESULT ENTRY): POC Glucose: 254 mg/dl — AB (ref 70–99)

## 2022-05-30 LAB — POCT GLYCOSYLATED HEMOGLOBIN (HGB A1C): HbA1c, POC (controlled diabetic range): 9.9 % — AB (ref 0.0–7.0)

## 2022-05-30 MED ORDER — METFORMIN HCL 1000 MG PO TABS
1000.0000 mg | ORAL_TABLET | Freq: Two times a day (BID) | ORAL | 1 refills | Status: DC
  Filled 2022-08-05: qty 180, 90d supply, fill #0
  Filled 2022-11-01: qty 60, 30d supply, fill #1
  Filled 2022-12-08 – 2022-12-19 (×3): qty 60, 30d supply, fill #2

## 2022-05-30 MED ORDER — LANTUS SOLOSTAR 100 UNIT/ML ~~LOC~~ SOPN
35.0000 [IU] | PEN_INJECTOR | Freq: Every day | SUBCUTANEOUS | 99 refills | Status: DC
  Filled 2022-05-30: qty 12, 34d supply, fill #0
  Filled 2022-07-07: qty 15, 42d supply, fill #0
  Filled 2022-08-18: qty 15, 42d supply, fill #1
  Filled 2022-09-30: qty 9, 25d supply, fill #2
  Filled 2022-10-03: qty 15, 42d supply, fill #2
  Filled 2022-10-07: qty 15, 42d supply, fill #0
  Filled 2022-11-24: qty 15, 42d supply, fill #1
  Filled 2022-12-07: qty 9, 25d supply, fill #1
  Filled 2022-12-28 (×2): qty 9, 25d supply, fill #2
  Filled 2022-12-29: qty 9, 25d supply, fill #0

## 2022-05-30 MED ORDER — INSULIN PEN NEEDLE 31G X 8 MM MISC
6 refills | Status: DC
  Filled 2022-05-30: qty 100, 100d supply, fill #0
  Filled 2022-07-07: qty 100, 25d supply, fill #0

## 2022-05-30 MED ORDER — LISINOPRIL 20 MG PO TABS
20.0000 mg | ORAL_TABLET | Freq: Every day | ORAL | 1 refills | Status: DC
  Filled 2022-05-30 – 2022-07-07 (×2): qty 90, 90d supply, fill #0
  Filled 2022-09-30 – 2022-10-03 (×2): qty 30, 30d supply, fill #1
  Filled 2022-10-07: qty 30, 30d supply, fill #0
  Filled 2022-11-01: qty 30, 30d supply, fill #1
  Filled 2022-12-08 – 2022-12-19 (×3): qty 30, 30d supply, fill #2

## 2022-05-30 MED ORDER — TRULICITY 1.5 MG/0.5ML ~~LOC~~ SOAJ
1.5000 mg | SUBCUTANEOUS | 3 refills | Status: DC
  Filled 2022-05-30 – 2022-07-07 (×2): qty 2, 28d supply, fill #0
  Filled 2022-08-05: qty 2, 28d supply, fill #1
  Filled 2022-09-30 – 2022-10-03 (×2): qty 2, 28d supply, fill #2
  Filled 2022-10-07: qty 2, 28d supply, fill #0
  Filled 2022-11-03 – 2022-12-07 (×3): qty 2, 28d supply, fill #1

## 2022-05-30 NOTE — Patient Instructions (Signed)
Increased glargine insulin to 35 units daily. Your blood pressure has improved but it is not at goal which is 130/80 or lower.  Increase lisinopril to 20 mg daily.

## 2022-05-30 NOTE — Progress Notes (Signed)
Patient ID: William Martin, male    DOB: 1980/06/13  MRN: [id number]  CC: Diabetes (DM f/u. Neoma Laming received flu vax. )   Subjective: William Martin is a 42 y.o. male who presents for chronic ds management His concerns today include:  patient with history of DM, HTN, HL, obesity, tobacco dependence (cigars).   DM:  Results for orders placed or performed in visit on 05/30/22  POCT glycosylated hemoglobin (Hb A1C)  Result Value Ref Range   Hemoglobin A1C     HbA1c POC (<> result, manual entry)     HbA1c, POC (prediabetic range)     HbA1c, POC (controlled diabetic range) 9.9 (A) 0.0 - 7.0 %  POCT glucose (manual entry)  Result Value Ref Range   POC Glucose 254 (A) 70 - 99 mg/dl  A1c today has decreased slightly from previous reading of 10.3. Patient should be on glargine 30 units daily, Trulicity 1.5 mg once a week and metformin 1 g twice a day. Reports compliance with taking since last visit.  Not checking BS.  Blood sugar today in the office is 254.  He reports that he has not eaten or drank anything as yet for the morning.  Reports he has been going through some stuff at work and home so he just has not had the time to check blood sugars. Feels he can do better in cutting back on portion sizes. Exercise: not exercising.  "I need to get my mind right to start doing it again."  Has a punching bag at home that he plans to start using.   HTN: Patient should be on lisinopril 10 mg daily and Norvasc 10 mg daily. Reports compliance with taking the meds and limits salt in foods.  Took meds already this a.m. No device at home to check BP No CP/SOB/LE edema/HA/dizziness.  HL: Patient should be on Lipitor 60 mg daily.  Last LDL was 103  Still smokes cigars.  Not ready to quit.  HM:  Due for eye exam.  Schedule for July.  Thinks he will have insurance by that time  Patient Active Problem List   Diagnosis Date Noted   Hyperlipidemia associated with type 2 diabetes mellitus ([place])  03/04/2019   Tobacco dependence 07/31/2018   Immunization due 01/27/2017   Essential hypertension 01/27/2017   Former cigar smoker 01/27/2017   Obesity 03/14/2016   Hyperlipidemia 03/14/2016   Hyperlipidemia due to type 2 diabetes mellitus ([place]) 03/14/2016   New onset type 2 diabetes mellitus ([place]) 03/11/2016   GERD (gastroesophageal reflux disease) 03/11/2016     Current Outpatient Medications on File Prior to Visit  Medication Sig Dispense Refill   amLODipine (NORVASC) 10 MG tablet TAKE 1 TABLET (10 MG TOTAL) BY MOUTH DAILY. 30 tablet 6   atorvastatin (LIPITOR) 40 MG tablet Take 1.5 tablets (60 mg total) by mouth daily. 45 tablet 6   Insulin Syringe-Needle U-100 (B-D INS SYR ULTRAFINE 1CC/30G) 30G X 1/2" 1 ML MISC Use 1 Syringe for diabetes 2 (two) times daily. 100 each 3   omeprazole (PRILOSEC) 20 MG capsule TAKE 1 CAPSULE EVERY DAY AS NEEDED FOR HEARTBURN (Patient taking differently: Take 20 mg by mouth daily as needed (heartburn).) 30 capsule 1   Aspirin-Salicylamide-Caffeine (BC HEADACHE POWDER PO) Take 1 packet by mouth daily as needed (headache). (Patient not taking: Reported on 05/30/2022)     No current facility-administered medications on file prior to visit.    Allergies  Allergen Reactions   Bee Venom Swelling  Social History   Socioeconomic History   Marital status: Single    Spouse name: Not on file   Number of children: Not on file   Years of education: Not on file   Highest education level: Not on file  Occupational History   Not on file  Tobacco Use   Smoking status: Some Days    Types: Cigars   Smokeless tobacco: Never   Tobacco comments:    2 cigars/month  Vaping Use   Vaping Use: Never used  Substance and Sexual Activity   Alcohol use: Yes    Alcohol/week: 6.0 standard drinks of alcohol    Types: 6 Standard drinks or equivalent per week    Comment: on weekends   Drug use: No   Sexual activity: Not on file  Other Topics Concern   Not on file   Social History Narrative   Not on file   Social Determinants of Health   Financial Resource Strain: Not on file  Food Insecurity: Not on file  Transportation Needs: Not on file  Physical Activity: Not on file  Stress: Not on file  Social Connections: Not on file  Intimate Partner Violence: Not on file    Family History  Problem Relation Age of Onset   Diabetes type II Mother     No past surgical history on file.  ROS: Review of Systems Negative except as stated above  PHYSICAL EXAM: BP 133/87   Pulse 89   Temp 98.3 F (36.8 C) (Oral)   Ht '5\' 10"'$  (1.778 m)   Wt 275 lb (124.7 kg)   SpO2 98%   BMI 39.46 kg/m   Wt Readings from Last 3 Encounters:  05/30/22 275 lb (124.7 kg)  02/24/22 274 lb 6.4 oz (124.5 kg)  01/18/21 275 lb 3.2 oz (124.8 kg)    Physical Exam   General appearance - alert, well appearing, and in no distress Mental status - normal mood, behavior, speech, dress, motor activity, and thought processes Chest - clear to auscultation, no wheezes, rales or rhonchi, symmetric air entry Heart - normal rate, regular rhythm, normal S1, S2, no murmurs, rubs, clicks or gallops Extremities - peripheral pulses normal, no pedal edema, no clubbing or cyanosis Diabetic Foot Exam - Simple   Simple Foot Form Diabetic Foot exam was performed with the following findings: Yes 05/30/2022 10:24 AM  Visual Inspection No deformities, no ulcerations, no other skin breakdown bilaterally: Yes Sensation Testing Intact to touch and monofilament testing bilaterally: Yes Pulse Check Posterior Tibialis and Dorsalis pulse intact bilaterally: Yes Comments        Latest Ref Rng & Units 02/24/2022   11:20 AM 05/31/2021    9:12 AM 03/02/2020   10:11 AM  CMP  Glucose 70 - 99 mg/dL 175  275  349   BUN 6 - 24 mg/dL '10  12  16   '$ Creatinine 0.76 - 1.27 mg/dL 1.02  0.97  0.96   Sodium 134 - 144 mmol/L 141  138  135   Potassium 3.5 - 5.2 mmol/L 4.0  4.2  4.2   Chloride 96 - 106  mmol/L 103  99  95   CO2 20 - 29 mmol/L '22  26  24   '$ Calcium 8.7 - 10.2 mg/dL 8.8  9.7  9.6   Total Protein 6.0 - 8.5 g/dL 7.5  7.6  7.9   Total Bilirubin 0.0 - 1.2 mg/dL 0.8  0.7  0.9   Alkaline Phos 44 - 121 IU/L 103  117  115   AST 0 - 40 IU/L 25  32  23   ALT 0 - 44 IU/L 28  43  48    Lipid Panel     Component Value Date/Time   CHOL 186 05/31/2021 0912   TRIG 244 (H) 05/31/2021 0912   HDL 41 05/31/2021 0912   CHOLHDL 4.5 05/31/2021 0912   CHOLHDL 9.7 03/12/2016 0557   VLDL UNABLE TO CALCULATE IF TRIGLYCERIDE OVER 400 mg/dL 03/12/2016 0557   LDLCALC 103 (H) 05/31/2021 0912    CBC    Component Value Date/Time   WBC 9.3 05/31/2021 0912   WBC 7.8 02/05/2019 1227   RBC 5.48 05/31/2021 0912   RBC 4.89 02/05/2019 1227   HGB 14.3 05/31/2021 0912   HCT 43.6 05/31/2021 0912   PLT 286 05/31/2021 0912   MCV 80 05/31/2021 0912   MCH 26.1 (L) 05/31/2021 0912   MCH 28.0 02/05/2019 1227   MCHC 32.8 05/31/2021 0912   MCHC 33.5 02/05/2019 1227   RDW 15.8 (H) 05/31/2021 0912    ASSESSMENT AND PLAN: 1. Diabetes mellitus type 2 in obese (HCC) Improved but not at goal.  Improved but not at goal. Compliance with medications has improved also based on his history. Encouraged smaller portion sizes. Encouraged him to start exercising with goal of 5 days a week for 30 minutes. Continue Trulicity 1.5 mg daily and metformin 1000 mg twice a day.  Increase glargine insulin to 35 units daily. - POCT glycosylated hemoglobin (Hb A1C) - POCT glucose (manual entry) - Microalbumin / creatinine urine ratio - CBC - Dulaglutide (TRULICITY) 1.5 0000000 SOPN; INJECT 1.5 MG INTO THE SKIN ONCE A WEEK.  Dispense: 2 mL; Refill: 3 - Insulin Pen Needle 31G X 8 MM MISC; Use as directed  Dispense: 100 each; Refill: 6 - metFORMIN (GLUCOPHAGE) 1000 MG tablet; Take 1 tablet (1,000 mg total) by mouth 2 (two) times daily.  Dispense: 180 tablet; Refill: 1 - insulin glargine (LANTUS SOLOSTAR) 100 UNIT/ML Solostar  Pen; Inject 35 Units into the skin daily.  Dispense: 15 mL; Refill: PRN  2. Hypertension associated with diabetes ([place]) Not at goal.  Continue Norvasc 10 mg daily.  Increase lisinopril to 20 mg daily. - lisinopril (ZESTRIL) 20 MG tablet; Take 1 tablet (20 mg total) by mouth daily.  Dispense: 90 tablet; Refill: 1  3. Hyperlipidemia associated with type 2 diabetes mellitus (HCC) Continue atorvastatin 40 mg daily. - Lipid panel  4. Tobacco dependence Strongly encouraged to quit.  Patient not ready to give a trial of quitting.     Patient was given the opportunity to ask questions.  Patient verbalized understanding of the plan and was able to repeat key elements of the plan.   This documentation was completed using Radio producer.  Any transcriptional errors are unintentional.  Orders Placed This Encounter  Procedures   Lipid panel   Microalbumin / creatinine urine ratio   CBC   POCT glycosylated hemoglobin (Hb A1C)   POCT glucose (manual entry)     Requested Prescriptions   Signed Prescriptions Disp Refills   Dulaglutide (TRULICITY) 1.5 0000000 SOPN 2 mL 3    Sig: INJECT 1.5 MG INTO THE SKIN ONCE A WEEK.   Insulin Pen Needle 31G X 8 MM MISC 100 each 6    Sig: Use as directed   lisinopril (ZESTRIL) 20 MG tablet 90 tablet 1    Sig: Take 1 tablet (20 mg total) by mouth daily.   metFORMIN (  GLUCOPHAGE) 1000 MG tablet 180 tablet 1    Sig: Take 1 tablet (1,000 mg total) by mouth 2 (two) times daily.   insulin glargine (LANTUS SOLOSTAR) 100 UNIT/ML Solostar Pen 15 mL PRN    Sig: Inject 35 Units into the skin daily.    Return in about 4 months (around 09/29/2022).  Karle Plumber, MD, FACP

## 2022-05-31 LAB — LIPID PANEL
Chol/HDL Ratio: 4.5 ratio (ref 0.0–5.0)
Cholesterol, Total: 178 mg/dL (ref 100–199)
HDL: 40 mg/dL (ref >39)
LDL Chol Calc (NIH): 108 mg/dL — ABNORMAL HIGH (ref 0–99)
Triglycerides: 171 mg/dL — ABNORMAL HIGH (ref 0–149)
VLDL Cholesterol Cal: 30 mg/dL (ref 5–40)

## 2022-05-31 LAB — CBC
Hematocrit: 44.3 % (ref 37.5–51.0)
Hemoglobin: 14.4 g/dL (ref 13.0–17.7)
MCH: 26.6 pg (ref 26.6–33.0)
MCHC: 32.5 g/dL (ref 31.5–35.7)
MCV: 82 fL (ref 79–97)
Platelets: 269 10*3/uL (ref 150–450)
RBC: 5.42 x10E6/uL (ref 4.14–5.80)
RDW: 15.3 % (ref 11.6–15.4)
WBC: 9.6 10*3/uL (ref 3.4–10.8)

## 2022-05-31 LAB — MICROALBUMIN / CREATININE URINE RATIO
Creatinine, Urine: 125.4 mg/dL
Microalb/Creat Ratio: 27 mg/g creat (ref 0–29)
Microalbumin, Urine: 33.4 ug/mL

## 2022-06-01 ENCOUNTER — Other Ambulatory Visit: Payer: Self-pay | Admitting: Internal Medicine

## 2022-06-01 DIAGNOSIS — E1169 Type 2 diabetes mellitus with other specified complication: Secondary | ICD-10-CM

## 2022-06-01 MED ORDER — ATORVASTATIN CALCIUM 80 MG PO TABS: 80.0000 mg | ORAL_TABLET | Freq: Every day | ORAL | 1 refills | Status: DC

## 2022-06-03 ENCOUNTER — Other Ambulatory Visit: Payer: Self-pay

## 2022-06-06 ENCOUNTER — Other Ambulatory Visit: Payer: Self-pay

## 2022-06-08 ENCOUNTER — Telehealth: Payer: Self-pay | Admitting: Internal Medicine

## 2022-06-08 NOTE — Telephone Encounter (Signed)
Called & spoke to the patient. Verified name & DOB. Informed of results and to pick up updated medication at the pharmacy. Patient expressed verbal understanding. No additional questions at this time.

## 2022-07-07 ENCOUNTER — Other Ambulatory Visit: Payer: Self-pay

## 2022-08-05 ENCOUNTER — Other Ambulatory Visit: Payer: Self-pay

## 2022-08-14 ENCOUNTER — Other Ambulatory Visit: Payer: Self-pay | Admitting: Physician Assistant

## 2022-08-14 DIAGNOSIS — E1159 Type 2 diabetes mellitus with other circulatory complications: Secondary | ICD-10-CM

## 2022-08-14 DIAGNOSIS — E1169 Type 2 diabetes mellitus with other specified complication: Secondary | ICD-10-CM

## 2022-08-15 NOTE — Telephone Encounter (Signed)
Appointment 10/04/22 Atorvastatin- dose changed Requested Prescriptions  Pending Prescriptions Disp Refills   amLODipine (NORVASC) 10 MG tablet [Pharmacy Med Name: AMLODIPINE BESYLATE 10 MG TAB] 90 tablet 0    Sig: TAKE 1 TABLET BY MOUTH EVERY DAY     Cardiovascular: Calcium Channel Blockers 2 Passed - 08/14/2022 12:14 AM      Passed - Last BP in normal range    BP Readings from Last 1 Encounters:  05/30/22 133/87         Passed - Last Heart Rate in normal range    Pulse Readings from Last 1 Encounters:  05/30/22 89         Passed - Valid encounter within last 6 months    Recent Outpatient Visits           2 months ago Diabetes mellitus type 2 in obese [facility])   [place] [facility] Jonah Blue B, MD   3 months ago Diabetes mellitus type 2 in obese [facility])   [place] [facility] [place], [place] L, RPH-CPP   5 months ago Diabetes mellitus type 2 in obese [facility])   [place] [facility] [place], [place], [place]   1 year ago Diabetes mellitus type 2 in obese [facility])   [place] [facility] Jonah Blue B, MD   1 year ago Diabetes mellitus type 2 in obese [facility])   [place] [facility] Marcine Matar, MD       Future Appointments             In 1 month Marcine Matar, MD [facility]             atorvastatin (LIPITOR) 40 MG tablet [Pharmacy Med Name: ATORVASTATIN 40 MG TABLET] 135 tablet 2    Sig: TAKE 1.5 TABLETS BY MOUTH DAILY.     Cardiovascular:  Antilipid - Statins Failed - 08/14/2022 12:14 AM      Failed - Lipid Panel in normal range within the last 12 months    Cholesterol, Total  Date Value Ref Range Status  05/30/2022 178 100 - 199 mg/dL Final   LDL Chol Calc (NIH)  Date Value Ref Range Status  05/30/2022 108 (H) 0 - 99 mg/dL Final   HDL  Date Value Ref Range Status  05/30/2022 40 >39  mg/dL Final   Triglycerides  Date Value Ref Range Status  05/30/2022 171 (H) 0 - 149 mg/dL Final         Passed - Patient is not pregnant      Passed - Valid encounter within last 12 months    Recent Outpatient Visits           2 months ago Diabetes mellitus type 2 in obese [facility])   [place] [facility] Jonah Blue B, MD   3 months ago Diabetes mellitus type 2 in obese [facility])   [place] [facility] [place], Arpin L, RPH-CPP   5 months ago Diabetes mellitus type 2 in obese [facility])   [place] [facility] [place], [place], [place]   1 year ago Diabetes mellitus type 2 in obese [facility])   [place] [facility] Jonah Blue B, MD   1 year ago Diabetes mellitus type 2 in obese [facility])   [facility] Marcine Matar, MD       Future Appointments             In 1 month Laural Benes, Binnie Rail, MD [facility]

## 2022-08-18 ENCOUNTER — Other Ambulatory Visit: Payer: Self-pay

## 2022-09-30 ENCOUNTER — Other Ambulatory Visit: Payer: Self-pay | Admitting: Physician Assistant

## 2022-09-30 ENCOUNTER — Other Ambulatory Visit: Payer: Self-pay

## 2022-09-30 DIAGNOSIS — I152 Hypertension secondary to endocrine disorders: Secondary | ICD-10-CM

## 2022-09-30 MED ORDER — AMLODIPINE BESYLATE 10 MG PO TABS
10.0000 mg | ORAL_TABLET | Freq: Every day | ORAL | 1 refills | Status: DC
  Filled 2022-09-30: qty 30, 30d supply, fill #0
  Filled 2022-11-01: qty 30, 30d supply, fill #1
  Filled 2022-12-08 – 2022-12-19 (×3): qty 30, 30d supply, fill #2
  Filled 2023-01-24: qty 30, 30d supply, fill #3
  Filled 2023-03-10 – 2023-03-11 (×2): qty 30, 30d supply, fill #4
  Filled 2023-04-07 (×3): qty 30, 30d supply, fill #5

## 2022-10-03 ENCOUNTER — Other Ambulatory Visit (HOSPITAL_COMMUNITY): Payer: Self-pay

## 2022-10-03 ENCOUNTER — Other Ambulatory Visit: Payer: Self-pay

## 2022-10-04 ENCOUNTER — Ambulatory Visit: Payer: Self-pay | Admitting: Internal Medicine

## 2022-10-04 ENCOUNTER — Other Ambulatory Visit: Payer: Self-pay

## 2022-10-07 ENCOUNTER — Other Ambulatory Visit (HOSPITAL_COMMUNITY): Payer: Self-pay

## 2022-10-07 ENCOUNTER — Other Ambulatory Visit: Payer: Self-pay

## 2022-10-10 ENCOUNTER — Other Ambulatory Visit: Payer: Self-pay

## 2022-11-03 ENCOUNTER — Other Ambulatory Visit: Payer: Self-pay

## 2022-11-03 ENCOUNTER — Other Ambulatory Visit (HOSPITAL_COMMUNITY): Payer: Self-pay

## 2022-11-26 ENCOUNTER — Other Ambulatory Visit (HOSPITAL_COMMUNITY): Payer: Self-pay

## 2022-12-07 ENCOUNTER — Other Ambulatory Visit: Payer: Self-pay

## 2022-12-07 ENCOUNTER — Other Ambulatory Visit (HOSPITAL_COMMUNITY): Payer: Self-pay

## 2022-12-08 ENCOUNTER — Other Ambulatory Visit (HOSPITAL_COMMUNITY): Payer: Self-pay

## 2022-12-08 ENCOUNTER — Other Ambulatory Visit: Payer: Self-pay

## 2022-12-19 ENCOUNTER — Other Ambulatory Visit: Payer: Self-pay

## 2022-12-19 ENCOUNTER — Other Ambulatory Visit (HOSPITAL_COMMUNITY): Payer: Self-pay

## 2022-12-28 ENCOUNTER — Other Ambulatory Visit: Payer: Self-pay | Admitting: Internal Medicine

## 2022-12-28 ENCOUNTER — Other Ambulatory Visit: Payer: Self-pay

## 2022-12-28 DIAGNOSIS — E1169 Type 2 diabetes mellitus with other specified complication: Secondary | ICD-10-CM

## 2022-12-29 ENCOUNTER — Other Ambulatory Visit (HOSPITAL_COMMUNITY): Payer: Self-pay

## 2022-12-29 ENCOUNTER — Other Ambulatory Visit: Payer: Self-pay

## 2022-12-29 MED ORDER — TRULICITY 1.5 MG/0.5ML ~~LOC~~ SOAJ
1.5000 mg | SUBCUTANEOUS | 0 refills | Status: DC
  Filled 2022-12-29 (×2): qty 2, 28d supply, fill #0

## 2022-12-29 NOTE — Telephone Encounter (Signed)
Requested medication (s) are due for refill today: yes  Requested medication (s) are on the active medication list: yes  Last refill:  05/30/22  Future visit scheduled: yes  Notes to clinic:  unable to refill, DX code needed. Routing for approval.     Requested Prescriptions  Pending Prescriptions Disp Refills   Dulaglutide (TRULICITY) 1.5 MG/0.5ML SOPN 2 mL 3    Sig: Inject 1.5 mg into the skin once a week.     Endocrinology:  Diabetes - GLP-1 Receptor Agonists Failed - 12/28/2022 12:19 PM      Failed - HBA1C is between 0 and 7.9 and within 180 days    HbA1c, POC (controlled diabetic range)  Date Value Ref Range Status  05/30/2022 9.9 (A) 0.0 - 7.0 % Final         Failed - Valid encounter within last 6 months    Recent Outpatient Visits           7 months ago Diabetes mellitus type 2 in obese [facility])   [place] [facility] Jonah Blue B, MD   8 months ago Diabetes mellitus type 2 in obese [facility])   [place] [facility] [place], Yettem L, RPH-CPP   10 months ago Diabetes mellitus type 2 in obese [facility])   [place] [facility] [place], [place], [place]   1 year ago Diabetes mellitus type 2 in obese [facility])   [place] [facility] Jonah Blue B, MD   1 year ago Diabetes mellitus type 2 in obese [facility])    [facility] Marcine Matar, MD       Future Appointments             In 5 days Marcine Matar, MD [facility]

## 2023-01-03 ENCOUNTER — Ambulatory Visit: Payer: Self-pay | Attending: Internal Medicine | Admitting: Internal Medicine

## 2023-01-03 ENCOUNTER — Other Ambulatory Visit: Payer: Self-pay

## 2023-01-03 ENCOUNTER — Encounter: Payer: Self-pay | Admitting: Internal Medicine

## 2023-01-03 DIAGNOSIS — E119 Type 2 diabetes mellitus without complications: Secondary | ICD-10-CM

## 2023-01-03 DIAGNOSIS — E66813 Obesity, class 3: Secondary | ICD-10-CM

## 2023-01-03 DIAGNOSIS — E1169 Type 2 diabetes mellitus with other specified complication: Secondary | ICD-10-CM | POA: Insufficient documentation

## 2023-01-03 DIAGNOSIS — Z7984 Long term (current) use of oral hypoglycemic drugs: Secondary | ICD-10-CM | POA: Insufficient documentation

## 2023-01-03 DIAGNOSIS — Z794 Long term (current) use of insulin: Secondary | ICD-10-CM | POA: Insufficient documentation

## 2023-01-03 DIAGNOSIS — Z23 Encounter for immunization: Secondary | ICD-10-CM | POA: Insufficient documentation

## 2023-01-03 DIAGNOSIS — E785 Hyperlipidemia, unspecified: Secondary | ICD-10-CM | POA: Insufficient documentation

## 2023-01-03 DIAGNOSIS — E1159 Type 2 diabetes mellitus with other circulatory complications: Secondary | ICD-10-CM | POA: Insufficient documentation

## 2023-01-03 DIAGNOSIS — R197 Diarrhea, unspecified: Secondary | ICD-10-CM | POA: Insufficient documentation

## 2023-01-03 DIAGNOSIS — F17290 Nicotine dependence, other tobacco product, uncomplicated: Secondary | ICD-10-CM | POA: Insufficient documentation

## 2023-01-03 DIAGNOSIS — I152 Hypertension secondary to endocrine disorders: Secondary | ICD-10-CM | POA: Insufficient documentation

## 2023-01-03 DIAGNOSIS — Z6839 Body mass index (BMI) 39.0-39.9, adult: Secondary | ICD-10-CM | POA: Insufficient documentation

## 2023-01-03 DIAGNOSIS — Z7985 Long-term (current) use of injectable non-insulin antidiabetic drugs: Secondary | ICD-10-CM | POA: Insufficient documentation

## 2023-01-03 LAB — GLUCOSE, POCT (MANUAL RESULT ENTRY): POC Glucose: 192 mg/dL — AB (ref 70–99)

## 2023-01-03 LAB — POCT GLYCOSYLATED HEMOGLOBIN (HGB A1C): HbA1c, POC (controlled diabetic range): 7.4 % — AB (ref 0.0–7.0)

## 2023-01-03 MED ORDER — LISINOPRIL 30 MG PO TABS
30.0000 mg | ORAL_TABLET | Freq: Every day | ORAL | 1 refills | Status: DC
Start: 2023-01-03 — End: 2023-08-24
  Filled 2023-01-03 – 2023-01-24 (×2): qty 90, 90d supply, fill #0
  Filled 2023-04-24 – 2023-05-23 (×3): qty 90, 90d supply, fill #1

## 2023-01-03 MED ORDER — TRULICITY 1.5 MG/0.5ML ~~LOC~~ SOAJ
1.5000 mg | SUBCUTANEOUS | 0 refills | Status: DC
  Filled 2023-01-03 – 2023-01-24 (×2): qty 2, 28d supply, fill #0

## 2023-01-03 MED ORDER — ATORVASTATIN CALCIUM 40 MG PO TABS
40.0000 mg | ORAL_TABLET | Freq: Every day | ORAL | 1 refills | Status: DC
  Filled 2023-01-03 – 2023-01-24 (×2): qty 90, 90d supply, fill #0
  Filled 2023-04-11 – 2023-04-24 (×4): qty 90, 90d supply, fill #1

## 2023-01-03 MED ORDER — LANTUS SOLOSTAR 100 UNIT/ML ~~LOC~~ SOPN
38.0000 [IU] | PEN_INJECTOR | Freq: Every day | SUBCUTANEOUS | 99 refills | Status: DC
  Filled 2023-01-03 – 2023-01-24 (×2): qty 15, 39d supply, fill #0
  Filled 2023-03-10 – 2023-03-11 (×2): qty 15, 39d supply, fill #1
  Filled 2023-04-04 – 2023-05-01 (×12): qty 15, 39d supply, fill #2
  Filled 2023-06-12 (×2): qty 15, 39d supply, fill #3
  Filled 2023-06-13 – 2023-07-27 (×3): qty 15, 39d supply, fill #4
  Filled 2023-08-24: qty 15, 39d supply, fill #5
  Filled 2023-10-16: qty 15, 39d supply, fill #6
  Filled 2023-11-28: qty 15, 39d supply, fill #7
  Filled 2024-01-01: qty 15, 39d supply, fill #8

## 2023-01-03 MED ORDER — METFORMIN HCL ER 500 MG PO TB24
1000.0000 mg | ORAL_TABLET | Freq: Every day | ORAL | 3 refills | Status: DC
Start: 2023-01-03 — End: 2024-02-06
  Filled 2023-01-03 – 2023-01-24 (×2): qty 180, 90d supply, fill #0
  Filled 2023-04-24 – 2023-05-23 (×3): qty 180, 90d supply, fill #1
  Filled 2023-08-24 (×2): qty 180, 90d supply, fill #2
  Filled 2023-11-05: qty 180, 90d supply, fill #3

## 2023-01-03 NOTE — Patient Instructions (Signed)
Increase insulin to 38 units daily. Decrease metformin to 1000 mg once a day.  We have changed this to the long-acting form. Your blood pressure is not at goal.  We have increased lisinopril to 30 mg daily.

## 2023-01-03 NOTE — Progress Notes (Signed)
Patient ID: William Martin, male    DOB: [birth year]  MRN: [id number]  CC: Diabetes (DM & HTN f/u. Med refill. /Intermittent diarrhea 2 hrs after eating X1 mo/Flu vax administered on 01/03/2023 - C.A.)   Subjective: William Martin is a 42 y.o. male who presents for chronic ds management. His concerns today include:  patient with history of DM, HTN, HL, obesity, tobacco dependence (cigars).    DM: Results for orders placed or performed in visit on 01/03/23  POCT glycosylated hemoglobin (Hb A1C)  Result Value Ref Range   Hemoglobin A1C     HbA1c POC (<> result, manual entry)     HbA1c, POC (prediabetic range)     HbA1c, POC (controlled diabetic range) 7.4 (A) 0.0 - 7.0 %  POCT glucose (manual entry)  Result Value Ref Range   POC Glucose 192 (A) 70 - 99 mg/dl  Reports compliance with Trulicity 1.5 mg daily and metformin 1000 mg twice a day, glargine insulin to 35 units daily.  Trulicity has not dec appetite Not working so eat what can get and snacking on sweet things.  Diarrhea post meals for past 1 mth.   Not checking BS regularly  HTN:   Patient should be on lisinopril 20 mg daily and Norvasc 10 mg daily. Reports compliance with taking the meds and limits salt in foods.  Took meds already this a.m.  No device at home to check BP No CP/SOB/LE edema/HA/dizziness.  HL: atorvastatin 80 mg daily. Out for several months   Patient Active Problem List   Diagnosis Date Noted   Hyperlipidemia associated with type 2 diabetes mellitus (HCC) 03/04/2019   Tobacco dependence 07/31/2018   Immunization due 01/27/2017   Essential hypertension 01/27/2017   Former cigar smoker 01/27/2017   Obesity 03/14/2016   Hyperlipidemia 03/14/2016   Hyperlipidemia due to type 2 diabetes mellitus (HCC) 03/14/2016   New onset type 2 diabetes mellitus (HCC) 03/11/2016   GERD (gastroesophageal reflux disease) 03/11/2016     Current Outpatient Medications on File Prior to Visit  Medication Sig Dispense  Refill   amLODipine (NORVASC) 10 MG tablet Take 1 tablet (10 mg total) by mouth daily. 90 tablet 1   Aspirin-Salicylamide-Caffeine (BC HEADACHE POWDER PO) Take 1 packet by mouth daily as needed (headache).     Insulin Syringe-Needle U-100 (B-D INS SYR ULTRAFINE 1CC/30G) 30G X 1/2" 1 ML MISC Use 1 Syringe for diabetes 2 (two) times daily. 100 each 3   omeprazole (PRILOSEC) 20 MG capsule TAKE 1 CAPSULE EVERY DAY AS NEEDED FOR HEARTBURN (Patient taking differently: Take 20 mg by mouth daily as needed (heartburn).) 30 capsule 1   Insulin Pen Needle 31G X 8 MM MISC Use as directed 100 each 6   No current facility-administered medications on file prior to visit.    Allergies  Allergen Reactions   Bee Venom Swelling    Social History   Socioeconomic History   Marital status: Single    Spouse name: Not on file   Number of children: Not on file   Years of education: Not on file   Highest education level: Not on file  Occupational History   Not on file  Tobacco Use   Smoking status: Some Days    Types: Cigars   Smokeless tobacco: Never   Tobacco comments:    2 cigars/month  Vaping Use   Vaping status: Never Used  Substance and Sexual Activity   Alcohol use: Yes    Alcohol/week: 6.0 standard drinks  of alcohol    Types: 6 Standard drinks or equivalent per week    Comment: on weekends   Drug use: No   Sexual activity: Not on file  Other Topics Concern   Not on file  Social History Narrative   Not on file   Social Determinants of Health   Financial Resource Strain: Not on file  Food Insecurity: Not on file  Transportation Needs: Not on file  Physical Activity: Not on file  Stress: Not on file  Social Connections: Not on file  Intimate Partner Violence: Not on file    Family History  Problem Relation Age of Onset   Diabetes type II Mother     No past surgical history on file.  ROS: Review of Systems Negative except as stated above  PHYSICAL EXAM: BP (!) 134/94    Pulse (!) 106   Temp 98.5 F (36.9 C) (Oral)   Ht 5\' 10"  (1.778 m)   Wt 278 lb (126.1 kg)   SpO2 98%   BMI 39.89 kg/m   Wt Readings from Last 3 Encounters:  01/03/23 278 lb (126.1 kg)  05/30/22 275 lb (124.7 kg)  02/24/22 274 lb 6.4 oz (124.5 kg)    Physical Exam   General appearance - alert, well appearing, and in no distress Mental status - normal mood, behavior, speech, dress, motor activity, and thought processes Neck - supple, no significant adenopathy Chest - clear to auscultation, no wheezes, rales or rhonchi, symmetric air entry Heart - normal rate, regular rhythm, normal S1, S2, no murmurs, rubs, clicks or gallops Extremities - peripheral pulses normal, no pedal edema, no clubbing or cyanosis     Latest Ref Rng & Units 02/24/2022   11:20 AM 05/31/2021    9:12 AM 03/02/2020   10:11 AM  CMP  Glucose 70 - 99 mg/dL 253  664  403   BUN 6 - 24 mg/dL 10  12  16    Creatinine 0.76 - 1.27 mg/dL 4.74  2.59  5.63   Sodium 134 - 144 mmol/L 141  138  135   Potassium 3.5 - 5.2 mmol/L 4.0  4.2  4.2   Chloride 96 - 106 mmol/L 103  99  95   CO2 20 - 29 mmol/L 22  26  24    Calcium 8.7 - 10.2 mg/dL 8.8  9.7  9.6   Total Protein 6.0 - 8.5 g/dL 7.5  7.6  7.9   Total Bilirubin 0.0 - 1.2 mg/dL 0.8  0.7  0.9   Alkaline Phos 44 - 121 IU/L 103  117  115   AST 0 - 40 IU/L 25  32  23   ALT 0 - 44 IU/L 28  43  48    Lipid Panel     Component Value Date/Time   CHOL 178 05/30/2022 1031   TRIG 171 (H) 05/30/2022 1031   HDL 40 05/30/2022 1031   CHOLHDL 4.5 05/30/2022 1031   CHOLHDL 9.7 03/12/2016 0557   VLDL UNABLE TO CALCULATE IF TRIGLYCERIDE OVER 400 mg/dL 87/56/4332 9518   LDLCALC 108 (H) 05/30/2022 1031    CBC    Component Value Date/Time   WBC 9.6 05/30/2022 1031   WBC 7.8 02/05/2019 1227   RBC 5.42 05/30/2022 1031   RBC 4.89 02/05/2019 1227   HGB 14.4 05/30/2022 1031   HCT 44.3 05/30/2022 1031   PLT 269 05/30/2022 1031   MCV 82 05/30/2022 1031   MCH 26.6 05/30/2022 1031    MCH 28.0  02/05/2019 1227   MCHC 32.5 05/30/2022 1031   MCHC 33.5 02/05/2019 1227   RDW 15.3 05/30/2022 1031    ASSESSMENT AND PLAN: 1. Type 2 diabetes mellitus with morbid obesity (HCC) BMI of 39.  However meets criteria for morbid obesity and diabetes with comorbidity of age HTN and hyperlipidemia. A1c has improved since last visit. Discussed and encouraged healthy eating habits.  Advised on eating healthier snacks. Metformin may be causing diarrhea. Change metformin to metformin ER but decrease dose to 1 g daily.  Increase Lantus insulin to 38 units daily.  Continue Trulicity 1.5 mg once a week. - POCT glycosylated hemoglobin (Hb A1C) - POCT glucose (manual entry) - Dulaglutide (TRULICITY) 1.5 MG/0.5ML SOPN; Inject 1.5 mg into the skin once a week.  Dispense: 2 mL; Refill: 0 - insulin glargine (LANTUS SOLOSTAR) 100 UNIT/ML Solostar Pen; Inject 38 Units into the skin daily.  Dispense: 15 mL; Refill: PRN - Ambulatory referral to Ophthalmology - Comprehensive metabolic panel  2. Hyperlipidemia associated with type 2 diabetes mellitus (HCC) Restart atorvastatin.  Since he has been off of it for several months, we will restart him at 40 mg and check LFTs - atorvastatin (LIPITOR) 40 MG tablet; Take 1 tablet (40 mg total) by mouth daily. Please note dose increase  Dispense: 90 tablet; Refill: 1  3. Hypertension associated with diabetes (HCC) Not at goal.  Increase lisinopril to 30 mg daily.  Continue amlodipine 10 mg. - lisinopril (ZESTRIL) 30 MG tablet; Take 1 tablet (30 mg total) by mouth daily.  Dispense: 90 tablet; Refill: 1  4. Encounter for immunization - Flu vaccine trivalent PF, 6mos and older(Flulaval,Afluria,Fluarix,Fluzone)  5. Insulin long-term use (HCC) See #1 above  6. Diabetes mellitus treated with oral medication (HCC) See #1 above  7. Long-term (current) use of injectable non-insulin antidiabetic drugs See #1 above   Patient was given the opportunity to ask  questions.  Patient verbalized understanding of the plan and was able to repeat key elements of the plan.   This documentation was completed using Paediatric nurse.  Any transcriptional errors are unintentional.  Orders Placed This Encounter  Procedures   Flu vaccine trivalent PF, 6mos and older(Flulaval,Afluria,Fluarix,Fluzone)   Comprehensive metabolic panel   Ambulatory referral to Ophthalmology   POCT glycosylated hemoglobin (Hb A1C)   POCT glucose (manual entry)     Requested Prescriptions   Signed Prescriptions Disp Refills   atorvastatin (LIPITOR) 40 MG tablet 90 tablet 1    Sig: Take 1 tablet (40 mg total) by mouth daily. Please note dose increase   lisinopril (ZESTRIL) 30 MG tablet 90 tablet 1    Sig: Take 1 tablet (30 mg total) by mouth daily.   Dulaglutide (TRULICITY) 1.5 MG/0.5ML SOPN 2 mL 0    Sig: Inject 1.5 mg into the skin once a week.   metFORMIN (GLUCOPHAGE-XR) 500 MG 24 hr tablet 180 tablet 3    Sig: Take 2 tablets (1,000 mg total) by mouth daily with breakfast.   insulin glargine (LANTUS SOLOSTAR) 100 UNIT/ML Solostar Pen 15 mL PRN    Sig: Inject 38 Units into the skin daily.    Return in about 4 months (around 05/06/2023).  Jonah Blue, MD, FACP

## 2023-01-04 LAB — COMPREHENSIVE METABOLIC PANEL
ALT: 42 [IU]/L (ref 0–44)
AST: 30 [IU]/L (ref 0–40)
Albumin: 4.7 g/dL (ref 4.1–5.1)
Alkaline Phosphatase: 90 [IU]/L (ref 44–121)
BUN/Creatinine Ratio: 14 (ref 9–20)
BUN: 14 mg/dL (ref 6–24)
Bilirubin Total: 0.7 mg/dL (ref 0.0–1.2)
CO2: 25 mmol/L (ref 20–29)
Calcium: 10 mg/dL (ref 8.7–10.2)
Chloride: 99 mmol/L (ref 96–106)
Creatinine, Ser: 1.02 mg/dL (ref 0.76–1.27)
Globulin, Total: 3.2 g/dL (ref 1.5–4.5)
Glucose: 170 mg/dL — ABNORMAL HIGH (ref 70–99)
Potassium: 4.3 mmol/L (ref 3.5–5.2)
Sodium: 142 mmol/L (ref 134–144)
Total Protein: 7.9 g/dL (ref 6.0–8.5)
eGFR: 94 mL/min/{1.73_m2} (ref >59)

## 2023-01-09 ENCOUNTER — Other Ambulatory Visit: Payer: Self-pay

## 2023-01-13 ENCOUNTER — Other Ambulatory Visit: Payer: Self-pay

## 2023-01-17 ENCOUNTER — Other Ambulatory Visit: Payer: Self-pay

## 2023-01-24 ENCOUNTER — Other Ambulatory Visit: Payer: Self-pay

## 2023-03-10 ENCOUNTER — Other Ambulatory Visit: Payer: Self-pay | Admitting: Internal Medicine

## 2023-03-11 ENCOUNTER — Other Ambulatory Visit: Payer: Self-pay

## 2023-03-11 ENCOUNTER — Other Ambulatory Visit (HOSPITAL_COMMUNITY): Payer: Self-pay

## 2023-03-13 ENCOUNTER — Other Ambulatory Visit (HOSPITAL_COMMUNITY): Payer: Self-pay

## 2023-03-13 ENCOUNTER — Other Ambulatory Visit: Payer: Self-pay

## 2023-03-13 MED ORDER — TRULICITY 1.5 MG/0.5ML ~~LOC~~ SOAJ
1.5000 mg | SUBCUTANEOUS | 0 refills | Status: DC
  Filled 2023-03-13 (×2): qty 2, 28d supply, fill #0

## 2023-03-13 NOTE — Telephone Encounter (Signed)
Requested Prescriptions  Pending Prescriptions Disp Refills   Dulaglutide (TRULICITY) 1.5 MG/0.5ML SOAJ 2 mL 0    Sig: Inject 1.5 mg into the skin once a week.     Endocrinology:  Diabetes - GLP-1 Receptor Agonists Passed - 03/13/2023  7:56 AM      Passed - HBA1C is between 0 and 7.9 and within 180 days    HbA1c, POC (controlled diabetic range)  Date Value Ref Range Status  01/03/2023 7.4 (A) 0.0 - 7.0 % Final         Passed - Valid encounter within last 6 months    Recent Outpatient Visits           2 months ago Type 2 diabetes mellitus with morbid obesity (HCC)   [facility] Dept Of [place]. [facility] Jonah Blue B, MD   9 months ago Diabetes mellitus type 2 in obese [facility])   [facility] [facility] - A Dept Of [place]. [facility] Jonah Blue B, MD   10 months ago Diabetes mellitus type 2 in obese [facility])   [place] [facility] - [facility] Dept Of [place] Yehuda Savannah L, RPH-CPP   1 year ago Diabetes mellitus type 2 in obese [facility])   [place] [facility] - A Dept Of [place]. [facility] [place], [place], [place]   1 year ago Diabetes mellitus type 2 in obese [facility])   [place] [facility] - A Dept Of [place]. [facility] Marcine Matar, MD       Future Appointments             In 1 month Laural Benes Binnie Rail, MD [facility] - A Dept Of Eligha Bridegroom. [facility]

## 2023-04-04 ENCOUNTER — Other Ambulatory Visit: Payer: Self-pay | Admitting: Internal Medicine

## 2023-04-04 ENCOUNTER — Other Ambulatory Visit (HOSPITAL_COMMUNITY): Payer: Self-pay

## 2023-04-04 DIAGNOSIS — E1169 Type 2 diabetes mellitus with other specified complication: Secondary | ICD-10-CM

## 2023-04-04 MED ORDER — TRULICITY 1.5 MG/0.5ML ~~LOC~~ SOAJ
1.5000 mg | SUBCUTANEOUS | 0 refills | Status: DC
  Filled 2023-04-04 – 2023-04-05 (×3): qty 2, 28d supply, fill #0

## 2023-04-05 ENCOUNTER — Other Ambulatory Visit (HOSPITAL_COMMUNITY): Payer: Self-pay

## 2023-04-05 ENCOUNTER — Other Ambulatory Visit: Payer: Self-pay

## 2023-04-06 ENCOUNTER — Other Ambulatory Visit: Payer: Self-pay

## 2023-04-07 ENCOUNTER — Other Ambulatory Visit: Payer: Self-pay

## 2023-04-07 ENCOUNTER — Other Ambulatory Visit (HOSPITAL_COMMUNITY): Payer: Self-pay

## 2023-04-11 ENCOUNTER — Other Ambulatory Visit: Payer: Self-pay

## 2023-04-11 ENCOUNTER — Other Ambulatory Visit: Payer: Self-pay | Admitting: Internal Medicine

## 2023-04-11 ENCOUNTER — Other Ambulatory Visit (HOSPITAL_COMMUNITY): Payer: Self-pay

## 2023-04-11 DIAGNOSIS — I152 Hypertension secondary to endocrine disorders: Secondary | ICD-10-CM

## 2023-04-11 MED ORDER — AMLODIPINE BESYLATE 10 MG PO TABS
10.0000 mg | ORAL_TABLET | Freq: Every day | ORAL | 0 refills | Status: DC
  Filled 2023-04-11 – 2023-05-23 (×3): qty 90, 90d supply, fill #0

## 2023-04-13 ENCOUNTER — Other Ambulatory Visit (HOSPITAL_COMMUNITY): Payer: Self-pay

## 2023-04-17 ENCOUNTER — Other Ambulatory Visit: Payer: Self-pay

## 2023-04-19 ENCOUNTER — Other Ambulatory Visit: Payer: Self-pay

## 2023-04-20 ENCOUNTER — Other Ambulatory Visit: Payer: Self-pay

## 2023-04-24 ENCOUNTER — Other Ambulatory Visit: Payer: Self-pay

## 2023-04-25 ENCOUNTER — Other Ambulatory Visit: Payer: Self-pay

## 2023-05-01 ENCOUNTER — Other Ambulatory Visit: Payer: Self-pay | Admitting: Internal Medicine

## 2023-05-01 ENCOUNTER — Other Ambulatory Visit: Payer: Self-pay

## 2023-05-01 DIAGNOSIS — E1169 Type 2 diabetes mellitus with other specified complication: Secondary | ICD-10-CM

## 2023-05-01 MED ORDER — TRULICITY 1.5 MG/0.5ML ~~LOC~~ SOAJ
1.5000 mg | SUBCUTANEOUS | 0 refills | Status: DC
  Filled 2023-05-01: qty 2, 28d supply, fill #0

## 2023-05-02 ENCOUNTER — Other Ambulatory Visit: Payer: Self-pay

## 2023-05-08 ENCOUNTER — Ambulatory Visit: Payer: Medicaid Other | Admitting: Internal Medicine

## 2023-05-23 ENCOUNTER — Other Ambulatory Visit (HOSPITAL_COMMUNITY): Payer: Self-pay

## 2023-06-12 ENCOUNTER — Other Ambulatory Visit: Payer: Self-pay | Admitting: Internal Medicine

## 2023-06-12 ENCOUNTER — Other Ambulatory Visit: Payer: Self-pay

## 2023-06-13 ENCOUNTER — Other Ambulatory Visit (HOSPITAL_COMMUNITY): Payer: Self-pay

## 2023-06-13 ENCOUNTER — Other Ambulatory Visit: Payer: Self-pay

## 2023-06-13 MED ORDER — TRULICITY 1.5 MG/0.5ML ~~LOC~~ SOAJ
1.5000 mg | SUBCUTANEOUS | 0 refills | Status: DC
  Filled 2023-06-13: qty 2, 28d supply, fill #0

## 2023-06-14 ENCOUNTER — Other Ambulatory Visit: Payer: Self-pay

## 2023-06-15 ENCOUNTER — Other Ambulatory Visit: Payer: Self-pay | Admitting: Internal Medicine

## 2023-06-15 DIAGNOSIS — E1169 Type 2 diabetes mellitus with other specified complication: Secondary | ICD-10-CM

## 2023-07-01 ENCOUNTER — Other Ambulatory Visit (HOSPITAL_COMMUNITY): Payer: Self-pay

## 2023-07-03 ENCOUNTER — Ambulatory Visit: Payer: Medicaid Other | Attending: Internal Medicine | Admitting: Internal Medicine

## 2023-07-09 ENCOUNTER — Other Ambulatory Visit: Payer: Self-pay | Admitting: Internal Medicine

## 2023-07-10 ENCOUNTER — Other Ambulatory Visit (HOSPITAL_COMMUNITY): Payer: Self-pay

## 2023-07-10 MED ORDER — TRULICITY 1.5 MG/0.5ML ~~LOC~~ SOAJ
1.5000 mg | SUBCUTANEOUS | 0 refills | Status: DC
  Filled 2023-07-10 – 2023-07-11 (×2): qty 2, 28d supply, fill #0

## 2023-07-11 ENCOUNTER — Other Ambulatory Visit: Payer: Self-pay

## 2023-07-11 ENCOUNTER — Other Ambulatory Visit (HOSPITAL_COMMUNITY): Payer: Self-pay

## 2023-07-25 ENCOUNTER — Ambulatory Visit: Admitting: Internal Medicine

## 2023-07-26 ENCOUNTER — Other Ambulatory Visit: Payer: Self-pay | Admitting: Internal Medicine

## 2023-07-26 ENCOUNTER — Other Ambulatory Visit: Payer: Self-pay | Admitting: Physician Assistant

## 2023-07-26 DIAGNOSIS — E1169 Type 2 diabetes mellitus with other specified complication: Secondary | ICD-10-CM

## 2023-07-27 ENCOUNTER — Other Ambulatory Visit: Payer: Self-pay

## 2023-07-27 ENCOUNTER — Other Ambulatory Visit: Payer: Self-pay | Admitting: Internal Medicine

## 2023-07-27 ENCOUNTER — Other Ambulatory Visit (HOSPITAL_COMMUNITY): Payer: Self-pay

## 2023-07-27 ENCOUNTER — Other Ambulatory Visit: Payer: Self-pay | Admitting: Pharmacist

## 2023-07-27 DIAGNOSIS — E669 Obesity, unspecified: Secondary | ICD-10-CM

## 2023-07-27 MED ORDER — INSULIN SYRINGE-NEEDLE U-100 30G X 1/2" 1 ML MISC
1.0000 | Freq: Two times a day (BID) | 0 refills | Status: AC
  Filled 2023-07-27: qty 100, 50d supply, fill #0
  Filled 2023-07-27: qty 100, fill #0
  Filled 2023-08-24 (×2): qty 100, 50d supply, fill #0

## 2023-07-27 MED ORDER — ATORVASTATIN CALCIUM 40 MG PO TABS
40.0000 mg | ORAL_TABLET | Freq: Every day | ORAL | 0 refills | Status: DC
  Filled 2023-07-27 (×2): qty 30, 30d supply, fill #0

## 2023-07-27 MED ORDER — INSULIN PEN NEEDLE 31G X 8 MM MISC
0 refills | Status: DC
  Filled 2023-07-27: qty 100, fill #0
  Filled 2023-08-10: qty 100, 100d supply, fill #0
  Filled 2023-08-10: qty 100, fill #0

## 2023-08-02 ENCOUNTER — Other Ambulatory Visit: Payer: Self-pay | Admitting: Internal Medicine

## 2023-08-02 DIAGNOSIS — E1169 Type 2 diabetes mellitus with other specified complication: Secondary | ICD-10-CM

## 2023-08-03 ENCOUNTER — Other Ambulatory Visit (HOSPITAL_COMMUNITY): Payer: Self-pay

## 2023-08-03 MED ORDER — TRULICITY 1.5 MG/0.5ML ~~LOC~~ SOAJ
1.5000 mg | SUBCUTANEOUS | 3 refills | Status: DC
  Filled 2023-08-03 – 2023-08-24 (×3): qty 2, 28d supply, fill #0
  Filled 2023-08-24 – 2023-10-02 (×2): qty 2, 28d supply, fill #1

## 2023-08-04 ENCOUNTER — Ambulatory Visit: Admitting: Internal Medicine

## 2023-08-04 ENCOUNTER — Other Ambulatory Visit (HOSPITAL_COMMUNITY): Payer: Self-pay

## 2023-08-04 ENCOUNTER — Other Ambulatory Visit: Payer: Self-pay

## 2023-08-10 ENCOUNTER — Other Ambulatory Visit: Payer: Self-pay

## 2023-08-10 ENCOUNTER — Other Ambulatory Visit (HOSPITAL_COMMUNITY): Payer: Self-pay

## 2023-08-10 ENCOUNTER — Other Ambulatory Visit: Payer: Self-pay | Admitting: Internal Medicine

## 2023-08-10 DIAGNOSIS — E1169 Type 2 diabetes mellitus with other specified complication: Secondary | ICD-10-CM

## 2023-08-10 MED ORDER — ATORVASTATIN CALCIUM 40 MG PO TABS
40.0000 mg | ORAL_TABLET | Freq: Every day | ORAL | 0 refills | Status: DC
  Filled 2023-08-10 – 2023-08-24 (×3): qty 90, 90d supply, fill #0

## 2023-08-11 ENCOUNTER — Other Ambulatory Visit (HOSPITAL_COMMUNITY): Payer: Self-pay

## 2023-08-18 ENCOUNTER — Ambulatory Visit: Admitting: Internal Medicine

## 2023-08-24 ENCOUNTER — Other Ambulatory Visit: Payer: Self-pay

## 2023-08-24 ENCOUNTER — Other Ambulatory Visit: Payer: Self-pay | Admitting: Internal Medicine

## 2023-08-24 ENCOUNTER — Other Ambulatory Visit (HOSPITAL_COMMUNITY): Payer: Self-pay

## 2023-08-24 DIAGNOSIS — I152 Hypertension secondary to endocrine disorders: Secondary | ICD-10-CM

## 2023-08-24 MED ORDER — LISINOPRIL 30 MG PO TABS
30.0000 mg | ORAL_TABLET | Freq: Every day | ORAL | 0 refills | Status: DC
  Filled 2023-08-24 (×2): qty 30, 30d supply, fill #0

## 2023-08-24 MED ORDER — AMLODIPINE BESYLATE 10 MG PO TABS
10.0000 mg | ORAL_TABLET | Freq: Every day | ORAL | 0 refills | Status: DC
  Filled 2023-08-24 (×2): qty 30, 30d supply, fill #0

## 2023-08-25 ENCOUNTER — Other Ambulatory Visit (HOSPITAL_COMMUNITY): Payer: Self-pay

## 2023-08-25 ENCOUNTER — Other Ambulatory Visit: Payer: Self-pay

## 2023-09-26 ENCOUNTER — Other Ambulatory Visit: Payer: Self-pay | Admitting: Internal Medicine

## 2023-09-26 ENCOUNTER — Ambulatory Visit: Admitting: Internal Medicine

## 2023-09-26 DIAGNOSIS — I152 Hypertension secondary to endocrine disorders: Secondary | ICD-10-CM

## 2023-09-27 ENCOUNTER — Other Ambulatory Visit: Payer: Self-pay

## 2023-09-27 MED ORDER — AMLODIPINE BESYLATE 10 MG PO TABS
10.0000 mg | ORAL_TABLET | Freq: Every day | ORAL | 0 refills | Status: DC
  Filled 2023-09-27: qty 30, 30d supply, fill #0

## 2023-09-27 MED ORDER — LISINOPRIL 30 MG PO TABS
30.0000 mg | ORAL_TABLET | Freq: Every day | ORAL | 0 refills | Status: DC
  Filled 2023-09-27: qty 30, 30d supply, fill #0

## 2023-10-02 ENCOUNTER — Other Ambulatory Visit: Payer: Self-pay

## 2023-10-02 ENCOUNTER — Ambulatory Visit: Payer: Self-pay | Attending: Internal Medicine | Admitting: Internal Medicine

## 2023-10-02 ENCOUNTER — Encounter: Payer: Self-pay | Admitting: Internal Medicine

## 2023-10-02 DIAGNOSIS — Z6838 Body mass index (BMI) 38.0-38.9, adult: Secondary | ICD-10-CM | POA: Insufficient documentation

## 2023-10-02 DIAGNOSIS — E785 Hyperlipidemia, unspecified: Secondary | ICD-10-CM | POA: Insufficient documentation

## 2023-10-02 DIAGNOSIS — Z23 Encounter for immunization: Secondary | ICD-10-CM

## 2023-10-02 DIAGNOSIS — E119 Type 2 diabetes mellitus without complications: Secondary | ICD-10-CM

## 2023-10-02 DIAGNOSIS — E1159 Type 2 diabetes mellitus with other circulatory complications: Secondary | ICD-10-CM | POA: Insufficient documentation

## 2023-10-02 DIAGNOSIS — Z7984 Long term (current) use of oral hypoglycemic drugs: Secondary | ICD-10-CM | POA: Insufficient documentation

## 2023-10-02 DIAGNOSIS — E1169 Type 2 diabetes mellitus with other specified complication: Secondary | ICD-10-CM | POA: Insufficient documentation

## 2023-10-02 DIAGNOSIS — F17210 Nicotine dependence, cigarettes, uncomplicated: Secondary | ICD-10-CM

## 2023-10-02 DIAGNOSIS — Z79899 Other long term (current) drug therapy: Secondary | ICD-10-CM | POA: Insufficient documentation

## 2023-10-02 DIAGNOSIS — F17290 Nicotine dependence, other tobacco product, uncomplicated: Secondary | ICD-10-CM | POA: Insufficient documentation

## 2023-10-02 DIAGNOSIS — Z7985 Long-term (current) use of injectable non-insulin antidiabetic drugs: Secondary | ICD-10-CM | POA: Insufficient documentation

## 2023-10-02 DIAGNOSIS — I152 Hypertension secondary to endocrine disorders: Secondary | ICD-10-CM | POA: Insufficient documentation

## 2023-10-02 DIAGNOSIS — Z794 Long term (current) use of insulin: Secondary | ICD-10-CM | POA: Insufficient documentation

## 2023-10-02 LAB — GLUCOSE, POCT (MANUAL RESULT ENTRY): POC Glucose: 142 mg/dL — AB (ref 70–99)

## 2023-10-02 LAB — POCT GLYCOSYLATED HEMOGLOBIN (HGB A1C): HbA1c, POC (controlled diabetic range): 7 % (ref 0.0–7.0)

## 2023-10-02 MED ORDER — AMLODIPINE BESYLATE 10 MG PO TABS
10.0000 mg | ORAL_TABLET | Freq: Every day | ORAL | 1 refills | Status: DC
  Filled 2023-10-02 – 2023-10-25 (×2): qty 90, 90d supply, fill #0
  Filled 2024-01-23 (×2): qty 90, 90d supply, fill #1

## 2023-10-02 MED ORDER — TRULICITY 1.5 MG/0.5ML ~~LOC~~ SOAJ
1.5000 mg | SUBCUTANEOUS | 3 refills | Status: DC
  Filled 2023-10-02 – 2023-10-04 (×2): qty 2, 28d supply, fill #0
  Filled 2023-11-05: qty 2, 28d supply, fill #1
  Filled 2023-12-03: qty 2, 28d supply, fill #2
  Filled 2024-01-01: qty 2, 28d supply, fill #3

## 2023-10-02 MED ORDER — ATORVASTATIN CALCIUM 40 MG PO TABS
40.0000 mg | ORAL_TABLET | Freq: Every day | ORAL | 1 refills | Status: AC
  Filled 2023-10-02 – 2023-11-28 (×2): qty 90, 90d supply, fill #0
  Filled 2024-03-06: qty 90, 90d supply, fill #1

## 2023-10-02 MED ORDER — LISINOPRIL 30 MG PO TABS
30.0000 mg | ORAL_TABLET | Freq: Every day | ORAL | 1 refills | Status: DC
  Filled 2023-10-02 – 2023-10-25 (×2): qty 90, 90d supply, fill #0
  Filled 2024-01-23 (×4): qty 90, 90d supply, fill #1

## 2023-10-02 NOTE — Patient Instructions (Signed)
 VISIT SUMMARY:  Today, you came in for a follow-up visit to manage your hypertension and diabetes. Your blood pressure is well-controlled, and your A1c has improved. You have also lost 10 pounds since October. We discussed your current medications, lifestyle habits, and upcoming health maintenance needs.  YOUR PLAN:  -TYPE 2 DIABETES MELLITUS: Type 2 diabetes is a condition where your body does not use insulin  properly, leading to high blood sugar levels. Your A1c has improved to 7.0%, and you have lost 10 pounds. Continue taking Trulicity  1.5 mg weekly, Lantus  38 units daily, and metformin  1000 mg daily. Please try to monitor your blood sugar regularly, maintain healthy eating habits, and reduce red meat intake. Aim to walk for 30 minutes, 3-4 days a week.  -HYPERTENSION: Hypertension, or high blood pressure, is when the force of your blood against your artery walls is too high. Your blood pressure is well-controlled at 117/79 mmHg. Continue taking lisinopril  30 mg daily and lidocaine 10 mg daily. Keep limiting your salt intake.  -HYPERLIPIDEMIA: Hyperlipidemia is having high levels of fats in your blood, which can increase your risk of heart disease. Continue taking atorvastatin  40 mg daily as prescribed.  -TOBACCO USE: You smoke one cigar every two weeks. While you are not ready to quit, support is available whenever you decide to stop.  -GENERAL HEALTH MAINTENANCE: You are due for a hepatitis B vaccine, a urine microalbumin test, and an eye exam. We will administer the first dose of the hepatitis B vaccine today and order the urine microalbumin test. Please check if your eye doctor accepts your insurance and schedule an eye exam.  INSTRUCTIONS:  Please schedule your next visit for a kidney function check. Continue with your current medications and lifestyle changes. Make sure to monitor your blood sugar regularly and maintain a healthy diet and exercise routine.

## 2023-10-02 NOTE — Progress Notes (Signed)
 Patient ID: William Martin, male    DOB: 12-Jun-1980  MRN: [id number]  CC: Diabetes (DM f/u. Med refill. /No questions / concerns/Yes to Hep B vax)   Subjective: William Martin is a 43 y.o. male who presents for chronic ds management. His concerns today include:  patient with history of DM, HTN, HL, obesity, tobacco dependence (cigars).   Discussed the use of AI scribe software for clinical note transcription with the patient, who gave verbal consent to proceed.  History of Present Illness William Martin is a 43 year old male with hypertension and diabetes who presents for follow-up care.  Hypertension is managed with lisinopril  30 mg daily and Norvasc  10 mg daily, with a recent blood pressure reading of 117/79 mmHg. He denies chest pain or shortness of breath and avoids adding salt to his food.  DM: Results for orders placed or performed in visit on 10/02/23  POCT glucose (manual entry)   Collection Time: 10/02/23  4:42 PM  Result Value Ref Range   POC Glucose 142 (A) 70 - 99 mg/dl  POCT glycosylated hemoglobin (Hb A1C)   Collection Time: 10/02/23  5:07 PM  Result Value Ref Range   Hemoglobin A1C     HbA1c POC (<> result, manual entry)     HbA1c, POC (prediabetic range)     HbA1c, POC (controlled diabetic range) 7.0 0.0 - 7.0 %   Diabetes management includes Trulicity  1.5 mg weekly, Lantus  insulin  38 units daily, and metformin  1000 mg daily. A1c has improved to 7.0. He has not been checking blood sugars recently due to a busy work schedule. His diet includes increased chicken and decreased red meat, with zero-calorie sodas and water. He has lost 10 pounds since October, now weighing 268 pounds. He experiences sweating at work and occasionally walks during his 12-hour shifts. He works five days a week, with a potential change to four 10-hour days.   He smokes cigars, approximately one every two weeks, and is not ready to quit.   He continues atorvastatin  40 mg for cholesterol  management.  HM: He is agreeable to receiving the hepatitis B vaccine series.  He is due for diabetic eye exam.  States that he now has insurance and will check with his eye doctor to see if they take his insurance.  If so we will schedule an appointment.      Patient Active Problem List   Diagnosis Date Noted   Hyperlipidemia associated with type 2 diabetes mellitus (HCC) 03/04/2019   Tobacco dependence 07/31/2018   Immunization due 01/27/2017   Essential hypertension 01/27/2017   Former cigar smoker 01/27/2017   Obesity 03/14/2016   Hyperlipidemia 03/14/2016   Hyperlipidemia due to type 2 diabetes mellitus (HCC) 03/14/2016   New onset type 2 diabetes mellitus (HCC) 03/11/2016   GERD (gastroesophageal reflux disease) 03/11/2016     Current Outpatient Medications on File Prior to Visit  Medication Sig Dispense Refill   Aspirin-Salicylamide-Caffeine (BC HEADACHE POWDER PO) Take 1 packet by mouth daily as needed (headache).     insulin  glargine (LANTUS  SOLOSTAR) 100 UNIT/ML Solostar Pen Inject 38 Units into the skin daily. 15 mL PRN   Insulin  Pen Needle 31G X 8 MM MISC Use as directed daily 100 each 0   metFORMIN  (GLUCOPHAGE -XR) 500 MG 24 hr tablet Take 2 tablets (1,000 mg total) by mouth daily with breakfast. 180 tablet 3   omeprazole  (PRILOSEC) 20 MG capsule TAKE 1 CAPSULE EVERY DAY AS NEEDED FOR HEARTBURN (Patient taking differently:  Take 20 mg by mouth daily as needed (heartburn).) 30 capsule 1   Insulin  Syringe-Needle U-100 (B-D INS SYR ULTRAFINE 1CC/30G) 30G X 1/2 1 ML MISC Use 1 Syringe for diabetes 2 (two) times daily. 100 each 0   No current facility-administered medications on file prior to visit.    Allergies  Allergen Reactions   Bee Venom Swelling    Social History   Socioeconomic History   Marital status: Single    Spouse name: Not on file   Number of children: Not on file   Years of education: Not on file   Highest education level: Not on file  Occupational  History   Not on file  Tobacco Use   Smoking status: Some Days    Types: Cigars   Smokeless tobacco: Never   Tobacco comments:    2 cigars/month  Vaping Use   Vaping status: Never Used  Substance and Sexual Activity   Alcohol  use: Yes    Alcohol /week: 6.0 standard drinks of alcohol     Types: 6 Standard drinks or equivalent per week    Comment: on weekends   Drug use: No   Sexual activity: Not on file  Other Topics Concern   Not on file  Social History Narrative   Not on file   Social Drivers of Health   Financial Resource Strain: Low Risk  (10/02/2023)   Overall Financial Resource Strain (CARDIA)    Difficulty of Paying Living Expenses: Not hard at all  Food Insecurity: No Food Insecurity (10/02/2023)   Hunger Vital Sign    Worried About Running Out of Food in the Last Year: Never true    Ran Out of Food in the Last Year: Never true  Transportation Needs: No Transportation Needs (10/02/2023)   PRAPARE - Administrator, Civil Service (Medical): No    Lack of Transportation (Non-Medical): No  Physical Activity: Inactive (10/02/2023)   Exercise Vital Sign    Days of Exercise per Week: 0 days    Minutes of Exercise per Session: 0 min  Stress: No Stress Concern Present (10/02/2023)   [employer] of Occupational Health - Occupational Stress Questionnaire    Feeling of Stress: Not at all  Social Connections: Socially Isolated (10/02/2023)   Social Connection and Isolation Panel    Frequency of Communication with Friends and Family: Three times a week    Frequency of Social Gatherings with Friends and Family: Never    Attends Religious Services: Never    Database administrator or Organizations: No    Attends Banker Meetings: Never    Marital Status: Never married  Intimate Partner Violence: Not At Risk (10/02/2023)   Humiliation, Afraid, Rape, and Kick questionnaire    Fear of Current or Ex-Partner: No    Emotionally Abused: No    Physically Abused: No     Sexually Abused: No    Family History  Problem Relation Age of Onset   Diabetes type II Mother     No past surgical history on file.  ROS: Review of Systems Negative except as stated above  PHYSICAL EXAM: BP 117/79 (BP Location: Left Arm, Patient Position: Sitting, Cuff Size: Large)   Pulse 99   Temp 98.2 F (36.8 C) (Oral)   Ht 5' 10 (1.778 m)   Wt 268 lb (121.6 kg)   SpO2 96%   BMI 38.45 kg/m   Wt Readings from Last 3 Encounters:  10/02/23 268 lb (121.6 kg)  01/03/23 278  lb (126.1 kg)  05/30/22 275 lb (124.7 kg)    Physical Exam   General appearance - alert, well appearing, and in no distress Mental status - normal mood, behavior, speech, dress, motor activity, and thought processes Neck - supple, no significant adenopathy Chest - clear to auscultation, no wheezes, rales or rhonchi, symmetric air entry Heart - normal rate, regular rhythm, normal S1, S2, no murmurs, rubs, clicks or gallops Extremities - peripheral pulses normal, no pedal edema, no clubbing or cyanosis Diabetic Foot Exam - Simple   Simple Foot Form Diabetic Foot exam was performed with the following findings: Yes 10/02/2023  5:23 PM  Visual Inspection No deformities, no ulcerations, no other skin breakdown bilaterally: Yes Sensation Testing Intact to touch and monofilament testing bilaterally: Yes Pulse Check Posterior Tibialis and Dorsalis pulse intact bilaterally: Yes Comments        Latest Ref Rng & Units 01/03/2023    4:21 PM 02/24/2022   11:20 AM 05/31/2021    9:12 AM  CMP  Glucose 70 - 99 mg/dL 829  824  724   BUN 6 - 24 mg/dL 14  10  12    Creatinine 0.76 - 1.27 mg/dL 8.97  8.97  9.02   Sodium 134 - 144 mmol/L 142  141  138   Potassium 3.5 - 5.2 mmol/L 4.3  4.0  4.2   Chloride 96 - 106 mmol/L 99  103  99   CO2 20 - 29 mmol/L 25  22  26    Calcium  8.7 - 10.2 mg/dL 89.9  8.8  9.7   Total Protein 6.0 - 8.5 g/dL 7.9  7.5  7.6   Total Bilirubin 0.0 - 1.2 mg/dL 0.7  0.8  0.7    Alkaline Phos 44 - 121 IU/L 90  103  117   AST 0 - 40 IU/L 30  25  32   ALT 0 - 44 IU/L 42  28  43    Lipid Panel     Component Value Date/Time   CHOL 178 05/30/2022 1031   TRIG 171 (H) 05/30/2022 1031   HDL 40 05/30/2022 1031   CHOLHDL 4.5 05/30/2022 1031   CHOLHDL 9.7 03/12/2016 0557   VLDL UNABLE TO CALCULATE IF TRIGLYCERIDE OVER 400 mg/dL 87/83/7982 9442   LDLCALC 108 (H) 05/30/2022 1031    CBC    Component Value Date/Time   WBC 9.6 05/30/2022 1031   WBC 7.8 02/05/2019 1227   RBC 5.42 05/30/2022 1031   RBC 4.89 02/05/2019 1227   HGB 14.4 05/30/2022 1031   HCT 44.3 05/30/2022 1031   PLT 269 05/30/2022 1031   MCV 82 05/30/2022 1031   MCH 26.6 05/30/2022 1031   MCH 28.0 02/05/2019 1227   MCHC 32.5 05/30/2022 1031   MCHC 33.5 02/05/2019 1227   RDW 15.3 05/30/2022 1031    ASSESSMENT AND PLAN: 1. Type 2 diabetes mellitus associated with morbid obesity (HCC) (Primary) Morbid obesity associated with HTN and HL A1c improved to 7.0%. Compliant with medications. Weight decreased by 10 pounds, likely due to Trulicity . - Continue Trulicity  1.5 mg weekly. - Continue Lantus  38 units daily. - Continue metformin  1000 mg daily. - Encourage regular blood glucose monitoring. Encouraged him to try to increase activity level to get in some exercise at least 3 days a week for 30 minutes. - POCT glycosylated hemoglobin (Hb A1C) - POCT glucose (manual entry) - Microalbumin / creatinine urine ratio  2. Insulin  long-term use (HCC) 3. Diabetes mellitus treated with oral medication (  HCC) 4. Long-term (current) use of injectable non-insulin  antidiabetic drugs See #1 above.  5. Hypertension associated with diabetes (HCC) At goal.  Continue Norvasc  and lisinopril  - amLODipine  (NORVASC ) 10 MG tablet; Take 1 tablet (10 mg total) by mouth daily. Needs office for more refills  Dispense: 90 tablet; Refill: 1 - lisinopril  (ZESTRIL ) 30 MG tablet; Take 1 tablet (30 mg total) by mouth daily.Needs  office for more refills  Dispense: 90 tablet; Refill: 1  6. Hyperlipidemia associated with type 2 diabetes mellitus (HCC) Continue atorvastatin  - atorvastatin  (LIPITOR) 40 MG tablet; Take 1 tablet (40 mg total) by mouth daily.  Dispense: 90 tablet; Refill: 1  7. Light smoker Strongly advised to quit smoking.  Patient not ready to give a trial of quitting.  8. Need for hepatitis B vaccination Given 1st shot today  Patient was given the opportunity to ask questions.  Patient verbalized understanding of the plan and was able to repeat key elements of the plan.   This documentation was completed using Paediatric nurse.  Any transcriptional errors are unintentional.  Orders Placed This Encounter  Procedures   Heplisav-B  (HepB-CPG) Vaccine   Microalbumin / creatinine urine ratio   POCT glycosylated hemoglobin (Hb A1C)   POCT glucose (manual entry)     Requested Prescriptions   Signed Prescriptions Disp Refills   amLODipine  (NORVASC ) 10 MG tablet 90 tablet 1    Sig: Take 1 tablet (10 mg total) by mouth daily. Needs office for more refills   atorvastatin  (LIPITOR) 40 MG tablet 90 tablet 1    Sig: Take 1 tablet (40 mg total) by mouth daily.   Dulaglutide  (TRULICITY ) 1.5 MG/0.5ML SOAJ 2 mL 3    Sig: Inject 1.5 mg into the skin once a week.   lisinopril  (ZESTRIL ) 30 MG tablet 90 tablet 1    Sig: Take 1 tablet (30 mg total) by mouth daily.Needs office for more refills    Return in about 4 months (around 02/02/2024).  Barnie Louder, MD, FACP

## 2023-10-04 ENCOUNTER — Other Ambulatory Visit: Payer: Self-pay

## 2023-10-05 ENCOUNTER — Ambulatory Visit: Payer: Self-pay | Admitting: Internal Medicine

## 2023-10-05 LAB — MICROALBUMIN / CREATININE URINE RATIO
Creatinine, Urine: 139.7 mg/dL
Microalb/Creat Ratio: 13 mg/g{creat} (ref 0–29)
Microalbumin, Urine: 17.5 ug/mL

## 2023-10-09 ENCOUNTER — Ambulatory Visit: Admitting: Internal Medicine

## 2023-10-17 ENCOUNTER — Other Ambulatory Visit: Payer: Self-pay

## 2023-10-18 ENCOUNTER — Other Ambulatory Visit: Payer: Self-pay

## 2023-10-24 ENCOUNTER — Other Ambulatory Visit: Payer: Self-pay

## 2023-10-25 ENCOUNTER — Other Ambulatory Visit: Payer: Self-pay

## 2023-11-06 ENCOUNTER — Telehealth: Payer: Self-pay

## 2023-11-06 NOTE — Telephone Encounter (Signed)
 Patient can be called and placed on the nurse schedule.      Copied from CRM [id number]. Topic: Clinical - Request for Lab/Test Order >> Nov 06, 2023 12:45 PM William Martin wrote: Reason for CRM: Patient needs to schedule and STD Test. Patient would like to come in tomorrow morning Please contact the patient at [phone]. >> Nov 06, 2023 12:49 PM William Martin wrote: Patient has no symptoms

## 2023-11-07 ENCOUNTER — Other Ambulatory Visit: Payer: Self-pay

## 2023-11-09 NOTE — Telephone Encounter (Signed)
 Can patient be placed on nurse schedule for STD testing or does he need an office visit.

## 2023-11-09 NOTE — Telephone Encounter (Signed)
 Needs visit. If no appts, can do [facility] or [employer].

## 2023-11-10 NOTE — Telephone Encounter (Signed)
 Noted. Patient declined.

## 2023-11-28 ENCOUNTER — Other Ambulatory Visit: Payer: Self-pay

## 2023-12-04 ENCOUNTER — Other Ambulatory Visit: Payer: Self-pay

## 2023-12-13 ENCOUNTER — Other Ambulatory Visit: Payer: Self-pay | Admitting: Internal Medicine

## 2023-12-13 ENCOUNTER — Other Ambulatory Visit: Payer: Self-pay

## 2023-12-13 DIAGNOSIS — E1169 Type 2 diabetes mellitus with other specified complication: Secondary | ICD-10-CM

## 2023-12-14 ENCOUNTER — Other Ambulatory Visit (HOSPITAL_COMMUNITY): Payer: Self-pay

## 2023-12-14 ENCOUNTER — Other Ambulatory Visit: Payer: Self-pay

## 2023-12-14 MED ORDER — TRUEPLUS 5-BEVEL PEN NEEDLES 31G X 8 MM MISC
0 refills | Status: DC
  Filled 2023-12-14: qty 100, 90d supply, fill #0

## 2023-12-14 NOTE — Telephone Encounter (Signed)
 Requested Prescriptions  Pending Prescriptions Disp Refills   Insulin  Pen Needle (TRUEPLUS 5-BEVEL PEN NEEDLES) 31G X 8 MM MISC 100 each 0    Sig: Use as directed daily     Endocrinology: Diabetes - Testing Supplies Passed - 12/14/2023  3:57 PM      Passed - Valid encounter within last 12 months    Recent Outpatient Visits           2 months ago Type 2 diabetes mellitus associated with morbid obesity (HCC)   [facility] [place]. [facility] Vicci Sober B, MD   11 months ago Type 2 diabetes mellitus with morbid obesity [facility])   [facility] Of [place]. [facility] Vicci Sober NOVAK, MD   1 year ago Diabetes mellitus type 2 in obese [facility])   [place] [facility] Dept Of [place]. [facility] Vicci Sober NOVAK, MD   1 year ago Diabetes mellitus type 2 in obese [facility])   [place] [facility] Dept Of [place]. [facility] Fleeta Tonia Senior L, RPH-CPP   1 year ago Diabetes mellitus type 2 in obese [facility])    [facility] Dept Of [place]. [facility] [place], Jon HERO, [place]       Future Appointments             In 1 month Vicci, Sober NOVAK, MD [facility] - A Dept Of [place]. [facility], [place]

## 2024-01-02 ENCOUNTER — Other Ambulatory Visit: Payer: Self-pay

## 2024-01-23 ENCOUNTER — Other Ambulatory Visit (HOSPITAL_COMMUNITY): Payer: Self-pay

## 2024-01-23 ENCOUNTER — Other Ambulatory Visit: Payer: Self-pay

## 2024-01-24 ENCOUNTER — Other Ambulatory Visit: Payer: Self-pay

## 2024-01-24 ENCOUNTER — Other Ambulatory Visit (HOSPITAL_COMMUNITY): Payer: Self-pay

## 2024-01-30 ENCOUNTER — Other Ambulatory Visit: Payer: Self-pay | Admitting: Internal Medicine

## 2024-01-30 MED ORDER — TRULICITY 1.5 MG/0.5ML ~~LOC~~ SOAJ
1.5000 mg | SUBCUTANEOUS | 0 refills | Status: DC
  Filled 2024-01-30: qty 2, 28d supply, fill #0

## 2024-01-31 ENCOUNTER — Other Ambulatory Visit (HOSPITAL_BASED_OUTPATIENT_CLINIC_OR_DEPARTMENT_OTHER): Payer: Self-pay

## 2024-01-31 ENCOUNTER — Other Ambulatory Visit: Payer: Self-pay

## 2024-02-05 ENCOUNTER — Telehealth: Payer: Self-pay | Admitting: Internal Medicine

## 2024-02-05 NOTE — Telephone Encounter (Signed)
 Confirmed appt for 11/11

## 2024-02-06 ENCOUNTER — Encounter: Payer: Self-pay | Admitting: Internal Medicine

## 2024-02-06 ENCOUNTER — Ambulatory Visit: Payer: Self-pay | Attending: Internal Medicine | Admitting: Internal Medicine

## 2024-02-06 ENCOUNTER — Other Ambulatory Visit: Payer: Self-pay

## 2024-02-06 DIAGNOSIS — Z7984 Long term (current) use of oral hypoglycemic drugs: Secondary | ICD-10-CM

## 2024-02-06 DIAGNOSIS — E1159 Type 2 diabetes mellitus with other circulatory complications: Secondary | ICD-10-CM

## 2024-02-06 DIAGNOSIS — E785 Hyperlipidemia, unspecified: Secondary | ICD-10-CM

## 2024-02-06 DIAGNOSIS — Z6841 Body Mass Index (BMI) 40.0 and over, adult: Secondary | ICD-10-CM

## 2024-02-06 DIAGNOSIS — I152 Hypertension secondary to endocrine disorders: Secondary | ICD-10-CM

## 2024-02-06 DIAGNOSIS — Z7985 Long-term (current) use of injectable non-insulin antidiabetic drugs: Secondary | ICD-10-CM

## 2024-02-06 DIAGNOSIS — E1169 Type 2 diabetes mellitus with other specified complication: Secondary | ICD-10-CM

## 2024-02-06 DIAGNOSIS — I10 Essential (primary) hypertension: Secondary | ICD-10-CM

## 2024-02-06 DIAGNOSIS — Z794 Long term (current) use of insulin: Secondary | ICD-10-CM

## 2024-02-06 DIAGNOSIS — E119 Type 2 diabetes mellitus without complications: Secondary | ICD-10-CM

## 2024-02-06 DIAGNOSIS — Z23 Encounter for immunization: Secondary | ICD-10-CM

## 2024-02-06 LAB — GLUCOSE, POCT (MANUAL RESULT ENTRY)
POC Glucose: 524 mg/dL — AB (ref 70–99)
POC Glucose: 472 mg/dL — AB (ref 70–99)

## 2024-02-06 LAB — POCT GLYCOSYLATED HEMOGLOBIN (HGB A1C): Hemoglobin A1C: 10.7 % — AB (ref 4.0–5.6)

## 2024-02-06 MED ORDER — INSULIN ASPART 100 UNIT/ML IJ SOLN
5.0000 [IU] | Freq: Once | INTRAMUSCULAR | Status: AC
  Administered 2024-02-06: 5 [IU] via SUBCUTANEOUS

## 2024-02-06 MED ORDER — TRULICITY 3 MG/0.5ML ~~LOC~~ SOAJ
3.0000 mg | SUBCUTANEOUS | 6 refills | Status: AC
  Filled 2024-02-06 – 2024-02-21 (×6): qty 2, 28d supply, fill #0
  Filled 2024-03-06 – 2024-03-15 (×2): qty 2, 28d supply, fill #1
  Filled 2024-03-31 – 2024-04-08 (×3): qty 2, 28d supply, fill #2
  Filled ????-??-??: fill #0

## 2024-02-06 MED ORDER — METFORMIN HCL ER 500 MG PO TB24
1000.0000 mg | ORAL_TABLET | Freq: Two times a day (BID) | ORAL | 3 refills | Status: AC
  Filled 2024-02-06: qty 360, 90d supply, fill #0

## 2024-02-06 MED ORDER — LISINOPRIL 40 MG PO TABS
40.0000 mg | ORAL_TABLET | Freq: Every day | ORAL | 3 refills | Status: AC
  Filled 2024-02-06: qty 90, 90d supply, fill #0

## 2024-02-06 NOTE — Progress Notes (Signed)
 Patient ID: William Martin, male    DOB: 1980-12-28  MRN: [id number]  CC: chronic ds management  Subjective: William Martin is a 43 y.o. male who presents for chronic ds management. His concerns today include:  patient with history of DM, HTN, HL, obesity, tobacco dependence (cigars).   Discussed the use of AI scribe software for clinical note transcription with the patient, who gave verbal consent to proceed.  History of Present Illness William Martin is a 43 year old male with diabetes, hypertension, and hyperlipidemia who presents for a four-month follow-up visit.  DM: Results for orders placed or performed in visit on 02/06/24  POCT glycosylated hemoglobin (Hb A1C)   Collection Time: 02/06/24  2:54 PM  Result Value Ref Range   Hemoglobin A1C 10.7 (A) 4.0 - 5.6 %   HbA1c POC (<> result, manual entry)     HbA1c, POC (prediabetic range)     HbA1c, POC (controlled diabetic range)    POCT glucose (manual entry)   Collection Time: 02/06/24  2:54 PM  Result Value Ref Range   POC Glucose 524 (A) 70 - 99 mg/dl  POCT glucose (manual entry)   Collection Time: 02/06/24  4:26 PM  Result Value Ref Range   POC Glucose 472 (A) 70 - 99 mg/dl  His J8r has increased from 7.0% four months ago to 10.7% today. He is currently on Trulicity  1.5 mg weekly, Lantus  insulin  38 units daily, and metformin  1000 mg once daily, which he takes consistently. Blood sugar was 524 mg/dL today after consuming a large fruit smoothie that he purchased. He endorses frequent thirst and urination. No blurred vision. His weight has increased from 268 pounds to 280 pounds since the last visit. Over due for DM eye exam. Reports have Medicaid and insurance through his employer.  HTN: he is on lisinopril  30 mg daily and amlodipine  10 mg daily, which he takes regularly. He does not monitor his blood pressure at home but does not add salt to his food.  Regarding hyperlipidemia, he is taking atorvastatin  40 mg daily as  prescribed.      Patient Active Problem List   Diagnosis Date Noted   Hyperlipidemia associated with type 2 diabetes mellitus (HCC) 03/04/2019   Tobacco dependence 07/31/2018   Immunization due 01/27/2017   Essential hypertension 01/27/2017   Former cigar smoker 01/27/2017   Obesity 03/14/2016   Hyperlipidemia 03/14/2016   Hyperlipidemia due to type 2 diabetes mellitus (HCC) 03/14/2016   New onset type 2 diabetes mellitus (HCC) 03/11/2016   GERD (gastroesophageal reflux disease) 03/11/2016     Current Outpatient Medications on File Prior to Visit  Medication Sig Dispense Refill   amLODipine  (NORVASC ) 10 MG tablet Take 1 tablet (10 mg total) by mouth daily. Needs office for more refills 90 tablet 1   Aspirin-Salicylamide-Caffeine (BC HEADACHE POWDER PO) Take 1 packet by mouth daily as needed (headache).     atorvastatin  (LIPITOR) 40 MG tablet Take 1 tablet (40 mg total) by mouth daily. 90 tablet 1   insulin  glargine (LANTUS  SOLOSTAR) 100 UNIT/ML Solostar Pen Inject 38 Units into the skin daily. 15 mL PRN   Insulin  Pen Needle (TRUEPLUS 5-BEVEL PEN NEEDLES) 31G X 8 MM MISC Use as directed daily 100 each 0   omeprazole  (PRILOSEC) 20 MG capsule TAKE 1 CAPSULE EVERY DAY AS NEEDED FOR HEARTBURN (Patient taking differently: Take 20 mg by mouth daily as needed (heartburn).) 30 capsule 1   Insulin  Syringe-Needle U-100 (B-D INS SYR ULTRAFINE 1CC/30G)  30G X 1/2 1 ML MISC Use 1 Syringe for diabetes 2 (two) times daily. 100 each 0   No current facility-administered medications on file prior to visit.    Allergies  Allergen Reactions   Bee Venom Swelling    Social History   Socioeconomic History   Marital status: Single    Spouse name: Not on file   Number of children: Not on file   Years of education: Not on file   Highest education level: Not on file  Occupational History   Not on file  Tobacco Use   Smoking status: Some Days    Types: Cigars   Smokeless tobacco: Never    Tobacco comments:    2 cigars/month  Vaping Use   Vaping status: Never Used  Substance and Sexual Activity   Alcohol  use: Yes    Alcohol /week: 6.0 standard drinks of alcohol     Types: 6 Standard drinks or equivalent per week    Comment: on weekends   Drug use: No   Sexual activity: Not on file  Other Topics Concern   Not on file  Social History Narrative   Not on file   Social Drivers of Health   Financial Resource Strain: Low Risk  (10/02/2023)   Overall Financial Resource Strain (CARDIA)    Difficulty of Paying Living Expenses: Not hard at all  Food Insecurity: No Food Insecurity (10/02/2023)   Hunger Vital Sign    Worried About Running Out of Food in the Last Year: Never true    Ran Out of Food in the Last Year: Never true  Transportation Needs: No Transportation Needs (10/02/2023)   PRAPARE - Administrator, Civil Service (Medical): No    Lack of Transportation (Non-Medical): No  Physical Activity: Inactive (10/02/2023)   Exercise Vital Sign    Days of Exercise per Week: 0 days    Minutes of Exercise per Session: 0 min  Stress: No Stress Concern Present (10/02/2023)   [employer] of Occupational Health - Occupational Stress Questionnaire    Feeling of Stress: Not at all  Social Connections: Socially Isolated (10/02/2023)   Social Connection and Isolation Panel    Frequency of Communication with Friends and Family: Three times a week    Frequency of Social Gatherings with Friends and Family: Never    Attends Religious Services: Never    Database Administrator or Organizations: No    Attends Banker Meetings: Never    Marital Status: Never married  Intimate Partner Violence: Not At Risk (10/02/2023)   Humiliation, Afraid, Rape, and Kick questionnaire    Fear of Current or Ex-Partner: No    Emotionally Abused: No    Physically Abused: No    Sexually Abused: No    Family History  Problem Relation Age of Onset   Diabetes type II Mother     No  past surgical history on file.  ROS: Review of Systems Negative except as stated above  PHYSICAL EXAM: BP 129/89   Pulse 100   Resp 18   Ht 5' 10 (1.778 m)   Wt 280 lb (127 kg)   SpO2 97%   BMI 40.18 kg/m   Wt Readings from Last 3 Encounters:  02/06/24 280 lb (127 kg)  10/02/23 268 lb (121.6 kg)  01/03/23 278 lb (126.1 kg)    Physical Exam   General appearance - alert, well appearing, middle age obese AAM and in no distress Mental status - normal mood, behavior,  speech, dress, motor activity, and thought processes Mouth - mucous membranes moist, pharynx normal without lesions Neck - supple, no significant adenopathy Chest - clear to auscultation, no wheezes, rales or rhonchi, symmetric air entry Heart - normal rate, regular rhythm, normal S1, S2, no murmurs, rubs, clicks or gallops Extremities - peripheral pulses normal, no pedal edema, no clubbing or cyanosis     Latest Ref Rng & Units 01/03/2023    4:21 PM 02/24/2022   11:20 AM 05/31/2021    9:12 AM  CMP  Glucose 70 - 99 mg/dL 829  824  724   BUN 6 - 24 mg/dL 14  10  12    Creatinine 0.76 - 1.27 mg/dL 8.97  8.97  9.02   Sodium 134 - 144 mmol/L 142  141  138   Potassium 3.5 - 5.2 mmol/L 4.3  4.0  4.2   Chloride 96 - 106 mmol/L 99  103  99   CO2 20 - 29 mmol/L 25  22  26    Calcium  8.7 - 10.2 mg/dL 89.9  8.8  9.7   Total Protein 6.0 - 8.5 g/dL 7.9  7.5  7.6   Total Bilirubin 0.0 - 1.2 mg/dL 0.7  0.8  0.7   Alkaline Phos 44 - 121 IU/L 90  103  117   AST 0 - 40 IU/L 30  25  32   ALT 0 - 44 IU/L 42  28  43    Lipid Panel     Component Value Date/Time   CHOL 178 05/30/2022 1031   TRIG 171 (H) 05/30/2022 1031   HDL 40 05/30/2022 1031   CHOLHDL 4.5 05/30/2022 1031   CHOLHDL 9.7 03/12/2016 0557   VLDL UNABLE TO CALCULATE IF TRIGLYCERIDE OVER 400 mg/dL 87/83/7982 9442   LDLCALC 108 (H) 05/30/2022 1031    CBC    Component Value Date/Time   WBC 9.6 05/30/2022 1031   WBC 7.8 02/05/2019 1227   RBC 5.42 05/30/2022  1031   RBC 4.89 02/05/2019 1227   HGB 14.4 05/30/2022 1031   HCT 44.3 05/30/2022 1031   PLT 269 05/30/2022 1031   MCV 82 05/30/2022 1031   MCH 26.6 05/30/2022 1031   MCH 28.0 02/05/2019 1227   MCHC 32.5 05/30/2022 1031   MCHC 33.5 02/05/2019 1227   RDW 15.3 05/30/2022 1031    ASSESSMENT AND PLAN: 1. Type 2 diabetes mellitus associated with morbid obesity (HCC) (Primary) Diabetes treated with oral medication Insulin  long-term use Long-term use of injectable non-insulin  antidiabetic medication -Uncontrolled with significant elevation in A1c since last visit.  Most likely due to dietary indiscretion.  Encourage healthy eating habits. Given 5 units of NovoLog  today.  Will check chemistry. Encouraged him to check blood sugars at least twice a day before meals. Increase metformin  to 1000 mg twice a day. Increase Trulicity  to 3 mg once a week. Continue Lantus  insulin  38 units daily F/u with clinical pharmacist in 4 wks - POCT glycosylated hemoglobin (Hb A1C) - POCT glucose (manual entry) - CBC - Comprehensive metabolic panel with GFR - Lipid panel - Ambulatory referral to Ophthalmology - insulin  aspart (novoLOG ) injection 5 Units - Dulaglutide  (TRULICITY ) 3 MG/0.5ML SOAJ; Inject 3 mg as directed once a week.  Dispense: 2 mL; Refill: 6 - metFORMIN  (GLUCOPHAGE -XR) 500 MG 24 hr tablet; Take 2 tablets (1,000 mg total) by mouth 2 (two) times daily.  Dispense: 360 tablet; Refill: 3 - POCT glucose (manual entry)  2. Hypertension associated with diabetes (HCC) Not at goal.  Increase Lisinopril  to 40 mg. Continue Norvasc  10 mg - lisinopril  (ZESTRIL ) 40 MG tablet; Take 1 tablet (40 mg total) by mouth daily.  Dispense: 90 tablet; Refill: 3  3. Hyperlipidemia associated with type 2 diabetes mellitus (HCC) Continue Lipitor.  Due for lipid profile today.  4. Need for influenza vaccination - Flu vaccine trivalent PF, 6mos and older(Flulaval,Afluria,Fluarix,Fluzone)    Patient was given the  opportunity to ask questions.  Patient verbalized understanding of the plan and was able to repeat key elements of the plan.   This documentation was completed using Paediatric nurse.  Any transcriptional errors are unintentional.  Orders Placed This Encounter  Procedures   Flu vaccine trivalent PF, 6mos and older(Flulaval,Afluria,Fluarix,Fluzone)   CBC   Comprehensive metabolic panel with GFR   Lipid panel   Ambulatory referral to Ophthalmology   POCT glycosylated hemoglobin (Hb A1C)   POCT glucose (manual entry)   POCT glucose (manual entry)     Requested Prescriptions   Signed Prescriptions Disp Refills   lisinopril  (ZESTRIL ) 40 MG tablet 90 tablet 3    Sig: Take 1 tablet (40 mg total) by mouth daily.   Dulaglutide  (TRULICITY ) 3 MG/0.5ML SOAJ 2 mL 6    Sig: Inject 3 mg as directed once a week.   metFORMIN  (GLUCOPHAGE -XR) 500 MG 24 hr tablet 360 tablet 3    Sig: Take 2 tablets (1,000 mg total) by mouth 2 (two) times daily.    Return in about 4 months (around 06/05/2024) for 4 weeks with clinical pharmacist for DM.  Barnie Louder, MD, FACP

## 2024-02-06 NOTE — Patient Instructions (Signed)
  VISIT SUMMARY: Today, we reviewed your diabetes, hypertension, and hyperlipidemia management. Your A1c has increased, indicating that your blood sugar levels are not well controlled. We discussed changes to your medications and lifestyle to help manage your conditions better. We also addressed your blood pressure and cholesterol levels, and administered your flu shot.  YOUR PLAN: -TYPE 2 DIABETES MELLITUS WITH HYPERGLYCEMIA: Your blood sugar levels are high, which means your diabetes is not well controlled. We have increased your metformin  to 1000 mg twice daily and your Trulicity  to 3 mg weekly. You received 5 units of Novolog  insulin  today. Please monitor your blood sugar regularly. A referral has been made for a diabetic eye exam at [facility]. We will follow up in four weeks with a clinical pharmacist.  -ESSENTIAL HYPERTENSION: Your blood pressure is slightly elevated. We have increased your lisinopril  to 40 mg daily to help manage it better.  -HYPERLIPIDEMIA: This means you have high cholesterol levels. You are continuing on atorvastatin  40 mg daily. We have ordered blood tests to check your kidney and liver function, as well as your cholesterol levels.  -OBESITY: Your weight has increased to 280 pounds. We will continue to monitor your weight and discuss lifestyle changes to help manage it.  -GENERAL HEALTH MAINTENANCE: You were due for a flu vaccination, which you received today. We also reviewed your routine labs and found them to be normal.  INSTRUCTIONS: Please follow up in four weeks with the clinical pharmacist. Make sure to get your diabetic eye exam at [facility]. Continue to monitor your blood sugar regularly and take your medications as prescribed. We will review your blood test results at your next visit.                      Contains text generated by [employer].                                 Contains text  generated by [employer]bridge.

## 2024-02-07 ENCOUNTER — Ambulatory Visit: Payer: Self-pay | Admitting: Internal Medicine

## 2024-02-07 ENCOUNTER — Telehealth: Payer: Self-pay | Admitting: Internal Medicine

## 2024-02-07 ENCOUNTER — Other Ambulatory Visit: Payer: Self-pay

## 2024-02-07 ENCOUNTER — Telehealth: Payer: Self-pay | Admitting: *Deleted

## 2024-02-07 ENCOUNTER — Other Ambulatory Visit: Payer: Self-pay | Admitting: Internal Medicine

## 2024-02-07 DIAGNOSIS — E1169 Type 2 diabetes mellitus with other specified complication: Secondary | ICD-10-CM

## 2024-02-07 LAB — COMPREHENSIVE METABOLIC PANEL WITH GFR
ALT: 65 IU/L — ABNORMAL HIGH (ref 0–44)
AST: 30 IU/L (ref 0–40)
Albumin: 4.8 g/dL (ref 4.1–5.1)
Alkaline Phosphatase: 109 IU/L (ref 47–123)
BUN/Creatinine Ratio: 11 (ref 9–20)
BUN: 15 mg/dL (ref 6–24)
Bilirubin Total: 0.9 mg/dL (ref 0.0–1.2)
CO2: 25 mmol/L (ref 20–29)
Calcium: 10 mg/dL (ref 8.7–10.2)
Chloride: 98 mmol/L (ref 96–106)
Creatinine, Ser: 1.32 mg/dL — ABNORMAL HIGH (ref 0.76–1.27)
Globulin, Total: 3.1 g/dL (ref 1.5–4.5)
Glucose: 520 mg/dL (ref 70–99)
Potassium: 4.6 mmol/L (ref 3.5–5.2)
Sodium: 137 mmol/L (ref 134–144)
Total Protein: 7.9 g/dL (ref 6.0–8.5)
eGFR: 69 mL/min/1.73 (ref >59)

## 2024-02-07 LAB — LIPID PANEL
Chol/HDL Ratio: 5.4 ratio — ABNORMAL HIGH (ref 0.0–5.0)
Cholesterol, Total: 182 mg/dL (ref 100–199)
HDL: 34 mg/dL — ABNORMAL LOW (ref >39)
LDL Chol Calc (NIH): 106 mg/dL — ABNORMAL HIGH (ref 0–99)
Triglycerides: 242 mg/dL — ABNORMAL HIGH (ref 0–149)
VLDL Cholesterol Cal: 42 mg/dL — ABNORMAL HIGH (ref 5–40)

## 2024-02-07 LAB — CBC
Hematocrit: 44.8 % (ref 37.5–51.0)
Hemoglobin: 14 g/dL (ref 13.0–17.7)
MCH: 27 pg (ref 26.6–33.0)
MCHC: 31.3 g/dL — ABNORMAL LOW (ref 31.5–35.7)
MCV: 86 fL (ref 79–97)
Platelets: 268 x10E3/uL (ref 150–450)
RBC: 5.19 x10E6/uL (ref 4.14–5.80)
RDW: 14.9 % (ref 11.6–15.4)
WBC: 8.8 x10E3/uL (ref 3.4–10.8)

## 2024-02-07 MED ORDER — LANTUS SOLOSTAR 100 UNIT/ML ~~LOC~~ SOPN
43.0000 [IU] | PEN_INJECTOR | Freq: Every day | SUBCUTANEOUS | 99 refills | Status: AC
  Filled 2024-02-07: qty 15, 34d supply, fill #0
  Filled 2024-03-12: qty 15, 34d supply, fill #1
  Filled 2024-04-10: qty 15, 30d supply, fill #2

## 2024-02-07 NOTE — Telephone Encounter (Addendum)
 Hi Dr. Vicci,   I contacted the patient and informed him that we have his Medicaid Family Planning coverage active. However, the insurance through his employer which is [employer] appears to be out of state and I was not able to locate [employer]. The patient understood and stated he does not have any additional questions.    Fyi: Insurance information was asked and verified during check-in.

## 2024-02-07 NOTE — Telephone Encounter (Signed)
 Noted. Thanks.

## 2024-02-07 NOTE — Telephone Encounter (Signed)
 Message routed to PCP in another encounter.

## 2024-02-07 NOTE — Telephone Encounter (Signed)
 William Martin called from [employer] regarding critical labs.  Glucose- 520

## 2024-02-07 NOTE — Telephone Encounter (Signed)
 Katrice from [facility] calling for critical lab result, call transferred to [place] for immediate assistance.

## 2024-02-07 NOTE — Telephone Encounter (Signed)
-----   Message from Barnie Louder sent at 02/06/2024 10:29 PM EST ----- Regarding: Insurance I saw this pt today. He tells me that he has Medicaid and insurance through his employer. I do not see that front desk documented his insurance in the chart.

## 2024-02-13 ENCOUNTER — Other Ambulatory Visit: Payer: Self-pay

## 2024-02-14 ENCOUNTER — Other Ambulatory Visit: Payer: Self-pay

## 2024-02-14 ENCOUNTER — Encounter (HOSPITAL_COMMUNITY): Payer: Self-pay

## 2024-02-15 ENCOUNTER — Emergency Department (HOSPITAL_COMMUNITY)
Admission: EM | Admit: 2024-02-15 | Discharge: 2024-02-15 | Disposition: A | Discharge status: Home / Self Care | Attending: Emergency Medicine | Admitting: Emergency Medicine

## 2024-02-15 ENCOUNTER — Other Ambulatory Visit: Payer: Self-pay

## 2024-02-15 ENCOUNTER — Encounter (HOSPITAL_COMMUNITY): Payer: Self-pay

## 2024-02-15 DIAGNOSIS — R1084 Generalized abdominal pain: Secondary | ICD-10-CM | POA: Insufficient documentation

## 2024-02-15 DIAGNOSIS — R112 Nausea with vomiting, unspecified: Secondary | ICD-10-CM | POA: Insufficient documentation

## 2024-02-15 DIAGNOSIS — R8289 Other abnormal findings on cytological and histological examination of urine: Secondary | ICD-10-CM | POA: Diagnosis not present

## 2024-02-15 DIAGNOSIS — Z79899 Other long term (current) drug therapy: Secondary | ICD-10-CM | POA: Diagnosis not present

## 2024-02-15 DIAGNOSIS — Z794 Long term (current) use of insulin: Secondary | ICD-10-CM | POA: Diagnosis not present

## 2024-02-15 DIAGNOSIS — E1165 Type 2 diabetes mellitus with hyperglycemia: Secondary | ICD-10-CM | POA: Insufficient documentation

## 2024-02-15 LAB — URINALYSIS, ROUTINE W REFLEX MICROSCOPIC
Bacteria, UA: NONE SEEN
Bilirubin Urine: NEGATIVE
Glucose, UA: >=500 mg/dL — AB
Hgb urine dipstick: NEGATIVE
Ketones, ur: 5 mg/dL — AB
Leukocytes,Ua: NEGATIVE
Nitrite: NEGATIVE
Protein, ur: NEGATIVE mg/dL
Specific Gravity, Urine: 1.014 (ref 1.005–1.030)
pH: 5 (ref 5.0–8.0)

## 2024-02-15 LAB — CBC
HCT: 42.1 % (ref 39.0–52.0)
Hemoglobin: 14.1 g/dL (ref 13.0–17.0)
MCH: 26.8 pg (ref 26.0–34.0)
MCHC: 33.5 g/dL (ref 30.0–36.0)
MCV: 80 fL (ref 80.0–100.0)
Platelets: 280 K/uL (ref 150–400)
RBC: 5.26 MIL/uL (ref 4.22–5.81)
RDW: 14.3 % (ref 11.5–15.5)
WBC: 12.1 K/uL — ABNORMAL HIGH (ref 4.0–10.5)
nRBC: 0 % (ref 0.0–0.2)

## 2024-02-15 LAB — LIPASE, BLOOD: Lipase: 42 U/L (ref 11–51)

## 2024-02-15 LAB — CBG MONITORING, ED: Glucose-Capillary: 249 mg/dL — ABNORMAL HIGH (ref 70–99)

## 2024-02-15 LAB — COMPREHENSIVE METABOLIC PANEL WITH GFR
ALT: 59 U/L — ABNORMAL HIGH (ref 0–44)
AST: 28 U/L (ref 15–41)
Albumin: 4.5 g/dL (ref 3.5–5.0)
Alkaline Phosphatase: 90 U/L (ref 38–126)
Anion gap: 11 (ref 5–15)
BUN: 13 mg/dL (ref 6–20)
CO2: 24 mmol/L (ref 22–32)
Calcium: 9.3 mg/dL (ref 8.9–10.3)
Chloride: 100 mmol/L (ref 98–111)
Creatinine, Ser: 1.03 mg/dL (ref 0.61–1.24)
GFR, Estimated: >60 mL/min (ref >60)
Glucose, Bld: 280 mg/dL — ABNORMAL HIGH (ref 70–99)
Potassium: 4.3 mmol/L (ref 3.5–5.1)
Sodium: 134 mmol/L — ABNORMAL LOW (ref 135–145)
Total Bilirubin: 1.2 mg/dL (ref 0.0–1.2)
Total Protein: 8.2 g/dL — ABNORMAL HIGH (ref 6.5–8.1)

## 2024-02-15 MED ORDER — ONDANSETRON 4 MG PO TBDP
4.0000 mg | ORAL_TABLET | Freq: Once | ORAL | Status: AC
  Administered 2024-02-15: 4 mg via ORAL
  Filled 2024-02-15: qty 1

## 2024-02-15 MED ORDER — ONDANSETRON 4 MG PO TBDP
4.0000 mg | ORAL_TABLET | Freq: Three times a day (TID) | ORAL | 0 refills | Status: DC | PRN
  Filled 2024-02-15: qty 20, 7d supply, fill #0

## 2024-02-15 NOTE — Discharge Instructions (Addendum)
 I have prescribed Zofran  for your nausea and vomiting.  Please take as directed.  If you develop any life-threatening symptoms return to the emergency department.

## 2024-02-15 NOTE — ED Provider Notes (Signed)
 [facility] EMERGENCY DEPARTMENT AT [facility] Provider Note   CSN: [id number] Arrival date & time: 02/15/24  9987     Patient presents with: Abdominal Pain   William Martin is a 43 y.o. male.Patient with a past medical history significant for T2DM presenting with N/V and mild generalized abdominal pain. He ate seafood yesterday for lunch and shortly after became nauseous. He has vomited 4-5 times in the past 24 hours and states the consistency resembles the food. He states he hasn't tolerated food/fluids well and wasn't able to keep down pepto. Patient also denies diarrhea.  He has no tenderness to palpation. He does endorse increased urinary frequency without dysuria or blood. Denies fever, cough, rhinnorhea, sore throat.     Abdominal Pain      Prior to Admission medications   Medication Sig Start Date End Date Taking? Authorizing Provider  ondansetron  (ZOFRAN -ODT) 4 MG disintegrating tablet Take 1 tablet (4 mg total) by mouth every 8 (eight) hours as needed for nausea or vomiting. 02/15/24  Yes Logan Ubaldo NOVAK, PA-C  amLODipine  (NORVASC ) 10 MG tablet Take 1 tablet (10 mg total) by mouth daily. Needs office for more refills 10/02/23   Vicci Barnie NOVAK, MD  Aspirin-Salicylamide-Caffeine [facility] HEADACHE POWDER PO) Take 1 packet by mouth daily as needed (headache).    [provider]  atorvastatin  (LIPITOR) 40 MG tablet Take 1 tablet (40 mg total) by mouth daily. 10/02/23   Vicci Barnie NOVAK, MD  Dulaglutide  (TRULICITY ) 3 MG/0.5ML SOAJ Inject 3 mg as directed once a week. 02/06/24   Vicci Barnie NOVAK, MD  insulin  glargine (LANTUS  SOLOSTAR) 100 UNIT/ML Solostar Pen Inject 43 Units into the skin daily. 02/07/24   Vicci Barnie NOVAK, MD  Insulin  Pen Needle (TRUEPLUS 5-BEVEL PEN NEEDLES) 31G X 8 MM MISC Use as directed daily 12/14/23   Vicci Barnie NOVAK, MD  Insulin  Syringe-Needle U-100 (B-D INS SYR ULTRAFINE 1CC/30G) 30G X 1/2 1 ML MISC Use 1 Syringe for diabetes 2 (two)  times daily. 07/27/23   Vicci Barnie NOVAK, MD  lisinopril  (ZESTRIL ) 40 MG tablet Take 1 tablet (40 mg total) by mouth daily. 02/06/24   Vicci Barnie NOVAK, MD  metFORMIN  (GLUCOPHAGE -XR) 500 MG 24 hr tablet Take 2 tablets (1,000 mg total) by mouth 2 (two) times daily. 02/06/24   Vicci Barnie NOVAK, MD  omeprazole  (PRILOSEC) 20 MG capsule TAKE 1 CAPSULE EVERY DAY AS NEEDED FOR HEARTBURN Patient taking differently: Take 20 mg by mouth daily as needed (heartburn). 04/17/20   Vicci Barnie NOVAK, MD    Allergies: Bee venom    Review of Systems  Gastrointestinal:  Positive for abdominal pain.    Updated Vital Signs BP (!) 141/98 (BP Location: Left Arm)   Pulse (!) 102   Temp 98.6 F (37 C) (Oral)   Resp 16   Ht 5' 10 (1.778 m)   Wt 127 kg   SpO2 100%   BMI 40.18 kg/m   Physical Exam Vitals and nursing note reviewed.  Constitutional:      General: He is not in acute distress.    Appearance: He is well-developed.  HENT:     Head: Normocephalic and atraumatic.  Eyes:     Conjunctiva/sclera: Conjunctivae normal.  Cardiovascular:     Rate and Rhythm: Normal rate and regular rhythm.     Heart sounds: No murmur heard. Pulmonary:     Effort: Pulmonary effort is normal. No respiratory distress.     Breath sounds: Normal breath sounds.  Abdominal:     Palpations: Abdomen is soft.     Tenderness: There is no abdominal tenderness.  Musculoskeletal:        General: No swelling.     Cervical back: Neck supple.  Skin:    General: Skin is warm and dry.     Capillary Refill: Capillary refill takes less than 2 seconds.  Neurological:     Mental Status: He is alert.  Psychiatric:        Mood and Affect: Mood normal.     (all labs ordered are listed, but only abnormal results are displayed) Labs Reviewed  COMPREHENSIVE METABOLIC PANEL WITH GFR - Abnormal; Notable for the following components:      Result Value   Sodium 134 (*)    Glucose, Bld 280 (*)    Total Protein 8.2 (*)    ALT  59 (*)    All other components within normal limits  CBC - Abnormal; Notable for the following components:   WBC 12.1 (*)    All other components within normal limits  URINALYSIS, ROUTINE W REFLEX MICROSCOPIC - Abnormal; Notable for the following components:   Glucose, UA >=500 (*)    Ketones, ur 5 (*)    All other components within normal limits  CBG MONITORING, ED - Abnormal; Notable for the following components:   Glucose-Capillary 249 (*)    All other components within normal limits  LIPASE, BLOOD  CBG MONITORING, ED    EKG: None  Radiology: No results found.   Procedures   Medications Ordered in the ED  ondansetron  (ZOFRAN -ODT) disintegrating tablet 4 mg (4 mg Oral Given 02/15/24 0531)                                    Medical Decision Making Amount and/or Complexity of Data Reviewed Labs: ordered.  Risk Prescription drug management.   This patient presents to the ED for concern of abdominal pain with nausea and vomiting, this involves an extensive number of treatment options, and is a complaint that carries with it a high risk of complications and morbidity.  The differential diagnosis includes gastritis, gastroenteritis, viral illness, cholecystitis, appendicitis, DKA, others   Co morbidities / Chronic conditions that complicate the patient evaluation  Type II DM   Additional history obtained:  Additional history obtained from EMR External records from outside source obtained and reviewed including hospital.   Lab Tests:  I Ordered, and personally interpreted labs.  The pertinent results include: Glucose 280 with no anion gap and normal bicarb, white count of unclear significance at 12,100, UA with glucose and 5 ketones, lipase 42   Imaging Studies ordered:  Patient with no abdominal tenderness on examination.  No sign of surgical/acute abdomen   Problem List / ED Course / Critical interventions / Medication management   I ordered medication  including Zofran  Reevaluation of the patient after these medicines showed that the patient improved I have reviewed the patients home medicines and have made adjustments as needed    Social Determinants of Health:  Patient is an occasional smoker   Test / Admission - Considered:  Patient with no abdominal tenderness on examination.  Labs grossly reassuring.  No sign of DKA at this time.  Patient tolerating oral intake after Zofran .  Suspect foodborne illness versus viral process.  Patient prescribed Zofran .  Discharged in stable condition with no indication for further emergent workup or  admission at this time.      Final diagnoses:  Nausea and vomiting, unspecified vomiting type    ED Discharge Orders          Ordered    ondansetron  (ZOFRAN -ODT) 4 MG disintegrating tablet  Every 8 hours PRN        02/15/24 0648               Logan Ubaldo NOVAK, PA-C 02/16/24 0024    Midge Golas, MD 02/16/24 [phone]

## 2024-02-15 NOTE — ED Triage Notes (Signed)
 Pt reports constant middle abdominal pain that started today associated with nausea and vomiting. Emesis x 4 today.  Hx of diabetes and is worried his blood sugar is high. Denies hx of DKA.

## 2024-02-16 ENCOUNTER — Other Ambulatory Visit: Payer: Self-pay

## 2024-02-21 ENCOUNTER — Other Ambulatory Visit: Payer: Self-pay

## 2024-02-23 ENCOUNTER — Other Ambulatory Visit: Payer: Self-pay

## 2024-03-05 ENCOUNTER — Ambulatory Visit: Payer: Self-pay | Admitting: Pharmacist

## 2024-03-06 ENCOUNTER — Other Ambulatory Visit: Payer: Self-pay

## 2024-03-06 ENCOUNTER — Other Ambulatory Visit: Payer: Self-pay | Admitting: Internal Medicine

## 2024-03-06 DIAGNOSIS — E119 Type 2 diabetes mellitus without complications: Secondary | ICD-10-CM

## 2024-03-07 ENCOUNTER — Other Ambulatory Visit: Payer: Self-pay

## 2024-03-07 MED ORDER — TECHLITE PEN NEEDLES 31G X 8 MM MISC
2 refills | Status: AC
  Filled 2024-03-07: qty 100, 90d supply, fill #0

## 2024-03-09 ENCOUNTER — Other Ambulatory Visit (HOSPITAL_COMMUNITY): Payer: Self-pay

## 2024-03-19 ENCOUNTER — Other Ambulatory Visit: Payer: Self-pay

## 2024-03-19 ENCOUNTER — Encounter: Payer: Self-pay | Admitting: Pharmacist

## 2024-03-19 ENCOUNTER — Ambulatory Visit: Payer: Self-pay | Attending: Internal Medicine | Admitting: Pharmacist

## 2024-03-19 DIAGNOSIS — Z7985 Long-term (current) use of injectable non-insulin antidiabetic drugs: Secondary | ICD-10-CM | POA: Diagnosis not present

## 2024-03-19 DIAGNOSIS — E1169 Type 2 diabetes mellitus with other specified complication: Secondary | ICD-10-CM | POA: Diagnosis not present

## 2024-03-19 DIAGNOSIS — Z7984 Long term (current) use of oral hypoglycemic drugs: Secondary | ICD-10-CM

## 2024-03-19 DIAGNOSIS — Z794 Long term (current) use of insulin: Secondary | ICD-10-CM

## 2024-03-19 MED ORDER — ONDANSETRON 4 MG PO TBDP
4.0000 mg | ORAL_TABLET | Freq: Three times a day (TID) | ORAL | 1 refills | Status: AC | PRN
  Filled 2024-03-19: qty 20, 7d supply, fill #0

## 2024-03-19 NOTE — Progress Notes (Signed)
 "   S:     No chief complaint on file.  43 y.o. male who presents for diabetes evaluation, education, and management. PMH is significant for T2DM, HTN, hyperlipidemia, obesity, and tobacco use.   Patient was referred and last seen by Primary Care Provider, Dr. Vicci, on 02/06/2024. At that visit, A1c was 10.7% (up from 7.0 prior). Patient endorsed adherence to Trulicity  1.5 mg weekly, Lantus  38 units daily, and metformin  1000 mg daily. His dose of Trulicity  was increased to 3 mg weekly. Lantus  was increased to 43 units daily.    Of note, he was seen in the [facility] ED 02/15/24 with NV. Lipase normal. No suspicion of pancreatitis or GLP-1-induced NV. He had some bad seafood the day prior to presentation.   Today, patient arrives in good spirits and presents without any assistance. Patient reports Diabetes is longstanding.Tolerating the Trulicity  at the 3 mg weekly dose. Endorses adherence to this, Lantus  and metformin  as prescribed. Denies any NV, abdominal pain, or changes in vision. Admits today to experiencing blurred vision, polyuria, and polydipsia prior to seeing Dr. Vicci last month. Since seeing her and adjusting his diet and medication doses, these symptoms have resolved.   Family History: Mother has history of T2DM. Social History: Patient states he smokes 2 cigars/month.  Current diabetes medications include: Trulicity  3 mg once weekly, Basaglar  43 units daily, and metformin  1,000 mg daily daily. Current hypertension medications include: amlodipine  10 mg daily and lisinopril  40 mg daily Current hyperlipidemia medications include: atorvastatin  40 mg daily Patient reports adherence to taking all medications as prescribed.   Insurance coverage: [employer]  Denies hypoglycemic events.  Patient denies taking at-home blood glucose measurements.   Patient reports resolution in polyuria, polydipsia.  Patient denies neuropathy (nerve pain). Patient reports that he was experiencing some  blurred vision prior to PCP visit last month. Has now returned to baseline.  Patient reports self foot exams.   Patient reported dietary habits:  -Restricting fried foods, red meat, sodium, and added sugars. -Has decreased sweets since last time.  -Drinks: drinks 40 oz spaced throughout the day  Patient-reported exercise habits:  -None outside of work   O:  Lab Results  Component Value Date   HGBA1C 10.7 (A) 02/06/2024   There were no vitals filed for this visit.  Lipid Panel     Component Value Date/Time   CHOL 182 02/06/2024 1610   TRIG 242 (H) 02/06/2024 1610   HDL 34 (L) 02/06/2024 1610   CHOLHDL 5.4 (H) 02/06/2024 1610   CHOLHDL 9.7 03/12/2016 0557   VLDL UNABLE TO CALCULATE IF TRIGLYCERIDE OVER 400 mg/dL 87/83/7982 9442   LDLCALC 106 (H) 02/06/2024 1610   Clinical Atherosclerotic Cardiovascular Disease (ASCVD): No  The 10-year ASCVD risk score (Arnett DK, et al., 2019) is: 16.7%   Values used to calculate the score:     Age: 43 years     Clinically relevant sex: Male     Is Non-Hispanic African American: No     Diabetic: Yes     Tobacco smoker: Yes     Systolic Blood Pressure: 141 mmHg     Is BP treated: Yes     HDL Cholesterol: 34 mg/dL     Total Cholesterol: 182 mg/dL   Patient is participating in a Managed Medicaid Plan: No   A/P: Diabetes longstanding currently uncontrolled. Patient is not symptomatic at this time. In fact, his hyperglycemia-associated symptoms experienced prior to his PCP appt last month have resolved. He able to  verbalize appropriate hypoglycemia management plan. Medication adherence appears to be optimal.  -Continued Lantus  43 units daily. -Continued Trulicity  3 mg once weekly. -Continued metformin  1,000 mg twice daily. -Patient educated on purpose, proper use, and potential adverse effects of Trulicity , Basaglar , and metformin .  -Extensively discussed pathophysiology of diabetes, recommended lifestyle interventions, dietary effects on  blood sugar control.  -Counseled on s/sx of and management of hypoglycemia.  -Next A1c anticipated 04/2024.   Written patient instructions provided. Patient verbalized understanding of treatment plan.  Total time in face to face counseling 30 minutes.    Follow-up:  Pharmacist in Feb.  Herlene Fleeta Morris, PharmD, JAQUELINE, CPP Clinical Pharmacist [facility] [phone]    "

## 2024-03-22 ENCOUNTER — Other Ambulatory Visit: Payer: Self-pay

## 2024-04-01 ENCOUNTER — Other Ambulatory Visit: Payer: Self-pay

## 2024-04-03 ENCOUNTER — Other Ambulatory Visit: Payer: Self-pay

## 2024-04-08 ENCOUNTER — Other Ambulatory Visit: Payer: Self-pay

## 2024-04-09 ENCOUNTER — Other Ambulatory Visit: Payer: Self-pay

## 2024-04-10 ENCOUNTER — Other Ambulatory Visit: Payer: Self-pay

## 2024-04-16 ENCOUNTER — Other Ambulatory Visit: Payer: Self-pay

## 2024-04-25 ENCOUNTER — Other Ambulatory Visit: Payer: Self-pay | Admitting: Internal Medicine

## 2024-04-25 DIAGNOSIS — I152 Hypertension secondary to endocrine disorders: Secondary | ICD-10-CM

## 2024-04-26 ENCOUNTER — Other Ambulatory Visit: Payer: Self-pay

## 2024-04-26 MED ORDER — AMLODIPINE BESYLATE 10 MG PO TABS
10.0000 mg | ORAL_TABLET | Freq: Every day | ORAL | 1 refills | Status: AC
  Filled 2024-04-26: qty 30, 30d supply, fill #0

## 2024-05-21 ENCOUNTER — Ambulatory Visit: Payer: Self-pay | Admitting: Pharmacist

## 2024-06-11 ENCOUNTER — Ambulatory Visit: Payer: Self-pay | Admitting: Internal Medicine
